# Patient Record
Sex: Male | Born: 1935 | Race: White | Hispanic: No | Marital: Married | Smoking: Never smoker
Health system: Southern US, Community
[De-identification: ages and names within clinical notes are randomized; demographics above are authoritative.]

## PROBLEM LIST (undated history)

## (undated) DIAGNOSIS — E785 Hyperlipidemia, unspecified: Secondary | ICD-10-CM

## (undated) DIAGNOSIS — C4490 Unspecified malignant neoplasm of skin, unspecified: Secondary | ICD-10-CM

## (undated) DIAGNOSIS — I639 Cerebral infarction, unspecified: Secondary | ICD-10-CM

## (undated) DIAGNOSIS — K219 Gastro-esophageal reflux disease without esophagitis: Secondary | ICD-10-CM

## (undated) DIAGNOSIS — I10 Essential (primary) hypertension: Secondary | ICD-10-CM

## (undated) DIAGNOSIS — Z8719 Personal history of other diseases of the digestive system: Secondary | ICD-10-CM

## (undated) HISTORY — PX: CHOLECYSTECTOMY: SHX55

## (undated) HISTORY — PX: OTHER SURGICAL HISTORY: SHX169

## (undated) HISTORY — PX: ANKLE SURGERY: SHX546

## (undated) HISTORY — PX: COLONOSCOPY: SHX174

## (undated) HISTORY — PX: TRANSURETHRAL RESECTION OF PROSTATE: SHX73

---

## 2004-12-31 ENCOUNTER — Ambulatory Visit: Payer: Self-pay | Admitting: Gastroenterology

## 2005-04-10 ENCOUNTER — Other Ambulatory Visit: Payer: Self-pay

## 2005-04-10 ENCOUNTER — Emergency Department: Payer: Self-pay | Admitting: Emergency Medicine

## 2006-09-13 ENCOUNTER — Ambulatory Visit: Payer: Self-pay | Admitting: Family Medicine

## 2006-09-14 ENCOUNTER — Inpatient Hospital Stay: Payer: Self-pay | Admitting: Vascular Surgery

## 2006-09-14 ENCOUNTER — Ambulatory Visit: Payer: Self-pay | Admitting: Internal Medicine

## 2008-05-15 ENCOUNTER — Ambulatory Visit: Payer: Self-pay | Admitting: Unknown Physician Specialty

## 2012-06-25 ENCOUNTER — Emergency Department: Payer: Self-pay | Admitting: Emergency Medicine

## 2013-06-21 ENCOUNTER — Emergency Department: Payer: Self-pay | Admitting: Emergency Medicine

## 2013-06-22 LAB — CBC
HGB: 16 g/dL (ref 13.0–18.0)
MCH: 33.4 pg (ref 26.0–34.0)
MCV: 96 fL (ref 80–100)
RBC: 4.79 10*6/uL (ref 4.40–5.90)
RDW: 14 % (ref 11.5–14.5)

## 2013-06-22 LAB — BASIC METABOLIC PANEL
Anion Gap: 5 — ABNORMAL LOW (ref 7–16)
BUN: 26 mg/dL — ABNORMAL HIGH (ref 7–18)
Calcium, Total: 9 mg/dL (ref 8.5–10.1)
Chloride: 106 mmol/L (ref 98–107)
Co2: 27 mmol/L (ref 21–32)
Creatinine: 1.18 mg/dL (ref 0.60–1.30)
EGFR (African American): >60
EGFR (Non-African Amer.): 58 — ABNORMAL LOW
Glucose: 128 mg/dL — ABNORMAL HIGH (ref 65–99)
Potassium: 4.6 mmol/L (ref 3.5–5.1)

## 2013-06-22 LAB — TROPONIN I: Troponin-I: <0.02 ng/mL

## 2013-08-01 ENCOUNTER — Ambulatory Visit: Payer: Self-pay | Admitting: Internal Medicine

## 2014-02-20 ENCOUNTER — Ambulatory Visit: Payer: Self-pay | Admitting: Internal Medicine

## 2015-03-20 ENCOUNTER — Other Ambulatory Visit: Payer: Self-pay | Admitting: Internal Medicine

## 2015-03-20 DIAGNOSIS — R911 Solitary pulmonary nodule: Secondary | ICD-10-CM

## 2015-03-26 ENCOUNTER — Ambulatory Visit
Admission: RE | Admit: 2015-03-26 | Discharge: 2015-03-26 | Disposition: A | Discharge status: Home / Self Care | Payer: Medicare Other | Source: Ambulatory Visit | Attending: Internal Medicine | Admitting: Internal Medicine

## 2015-03-26 DIAGNOSIS — R911 Solitary pulmonary nodule: Secondary | ICD-10-CM | POA: Insufficient documentation

## 2015-03-26 DIAGNOSIS — I2510 Atherosclerotic heart disease of native coronary artery without angina pectoris: Secondary | ICD-10-CM | POA: Diagnosis not present

## 2015-03-26 HISTORY — DX: Essential (primary) hypertension: I10

## 2015-03-26 MED ORDER — IOHEXOL 300 MG/ML  SOLN
75.0000 mL | Freq: Once | INTRAMUSCULAR | Status: AC | PRN
  Administered 2015-03-26: 75 mL via INTRAVENOUS

## 2015-09-30 ENCOUNTER — Emergency Department: Payer: Medicare Other

## 2015-09-30 DIAGNOSIS — R079 Chest pain, unspecified: Secondary | ICD-10-CM | POA: Diagnosis not present

## 2015-09-30 DIAGNOSIS — R0989 Other specified symptoms and signs involving the circulatory and respiratory systems: Secondary | ICD-10-CM | POA: Diagnosis present

## 2015-09-30 DIAGNOSIS — I10 Essential (primary) hypertension: Secondary | ICD-10-CM | POA: Diagnosis not present

## 2015-09-30 LAB — CBC
HEMATOCRIT: 43.8 % (ref 40.0–52.0)
Hemoglobin: 14.9 g/dL (ref 13.0–18.0)
MCH: 33 pg (ref 26.0–34.0)
MCHC: 33.9 g/dL (ref 32.0–36.0)
MCV: 97.1 fL (ref 80.0–100.0)
PLATELETS: 150 10*3/uL (ref 150–440)
RBC: 4.51 MIL/uL (ref 4.40–5.90)
RDW: 14.7 % — AB (ref 11.5–14.5)
WBC: 8.3 10*3/uL (ref 3.8–10.6)

## 2015-09-30 NOTE — ED Notes (Addendum)
Pt in with co feeling like food is stuck started after eating chili.  Has happened in the past but is able to pass on its own.  Now feels like he has reflux and feels discomfort in his chest.

## 2015-10-01 ENCOUNTER — Emergency Department
Admission: EM | Admit: 2015-10-01 | Discharge: 2015-10-01 | Disposition: A | Discharge status: Home / Self Care | Payer: Medicare Other | Attending: Emergency Medicine | Admitting: Emergency Medicine

## 2015-10-01 DIAGNOSIS — R0989 Other specified symptoms and signs involving the circulatory and respiratory systems: Secondary | ICD-10-CM

## 2015-10-01 DIAGNOSIS — R09A2 Foreign body sensation, throat: Secondary | ICD-10-CM

## 2015-10-01 DIAGNOSIS — R079 Chest pain, unspecified: Secondary | ICD-10-CM

## 2015-10-01 LAB — BASIC METABOLIC PANEL
ANION GAP: 8 (ref 5–15)
BUN: 36 mg/dL — ABNORMAL HIGH (ref 6–20)
CALCIUM: 9.4 mg/dL (ref 8.9–10.3)
CO2: 22 mmol/L (ref 22–32)
CREATININE: 1.58 mg/dL — AB (ref 0.61–1.24)
Chloride: 106 mmol/L (ref 101–111)
GFR, EST AFRICAN AMERICAN: 45 mL/min — AB (ref >60)
GFR, EST NON AFRICAN AMERICAN: 39 mL/min — AB (ref >60)
Glucose, Bld: 114 mg/dL — ABNORMAL HIGH (ref 65–99)
Potassium: 4.2 mmol/L (ref 3.5–5.1)
SODIUM: 136 mmol/L (ref 135–145)

## 2015-10-01 LAB — TROPONIN I

## 2015-10-01 NOTE — ED Provider Notes (Signed)
[facility] Emergency Department Provider Note  ____________________________________________  Time seen: Approximately 3:24 AM  I have reviewed the triage vital signs and the nursing notes.   HISTORY  Chief Complaint Foreign Body    HPI Ronald Haney is a 82 y.o. male who presents to the ED from home with a chief complaint of feeling like food is stuck in his throat. History of esophageal stenosis s/p dilation several years ago. Patient ate beef chili this evening and feels that food is stuck in his lower esophagus. He tried to drink water but spit it back up. States he usually is able to resolve the feeling at home but is unable to this evening. Also feels like he has reflux and his chest. Denies associated symptoms of diaphoresis, shortness of breath, palpitations, dizziness, nausea, vomiting. Denies recent fever, chills, cough, congestion. Denies recent travel or trauma. Nothing makes his symptoms better or worse.   Past Medical History  Diagnosis Date  . Hypertension   Esophageal stenosis  There are no active problems to display for this patient.   Past surgical history Esophageal dilation  No current outpatient prescriptions on file.  Allergies Review of patient's allergies indicates no known allergies.  No family history on file.  Social History Social History  Substance Use Topics  . Smoking status: Not on file  . Smokeless tobacco: Not on file  . Alcohol Use: Not on file  Nonsmoker  Review of Systems  Constitutional: No fever/chills. Eyes: No visual changes. ENT: No sore throat. Cardiovascular: Positive for foreign body sensation and chest pain. Respiratory: Denies shortness of breath. Gastrointestinal: No abdominal pain.  No nausea, no vomiting.  No diarrhea.  No constipation. Genitourinary: Negative for dysuria. Musculoskeletal: Negative for back pain. Skin: Negative for rash. Neurological: Negative for headaches, focal  weakness or numbness.  10-point ROS otherwise negative.  ____________________________________________   PHYSICAL EXAM:  VITAL SIGNS: ED Triage Vitals  Enc Vitals Group     BP 09/30/15 2321 163/77 mmHg     Pulse Rate 09/30/15 2321 58     Resp 10/01/15 0300 20     Temp 09/30/15 2321 98.1 F (36.7 C)     Temp Source 09/30/15 2321 Oral     SpO2 09/30/15 2321 98 %     Weight 09/30/15 2321 189 lb (85.73 kg)     Height 09/30/15 2321 5\' 10"  (1.778 m)     Head Cir --      Peak Flow --      Pain Score 09/30/15 2325 5     Pain Loc --      Pain Edu? --      Excl. in [place]? --     Constitutional: Alert and oriented. Well appearing and in no acute distress. Eyes: Conjunctivae are normal. PERRL. EOMI. Head: Atraumatic. Nose: No congestion/rhinnorhea. Mouth/Throat: Mucous membranes are moist.  Oropharynx non-erythematous. Neck: No stridor.   Cardiovascular: Normal rate, regular rhythm. Grossly normal heart sounds.  Good peripheral circulation. Respiratory: Normal respiratory effort.  No retractions. Lungs CTAB. Gastrointestinal: Soft and nontender. No distention. No abdominal bruits. No CVA tenderness. Musculoskeletal: No lower extremity tenderness nor edema.  No joint effusions. Neurologic:  Normal speech and language. No gross focal neurologic deficits are appreciated. No gait instability. Skin:  Skin is warm, dry and intact. No rash noted. Psychiatric: Mood and affect are normal. Speech and behavior are normal.  ____________________________________________   LABS (all labs ordered are listed, but only abnormal results are displayed)  Labs Reviewed  CBC - Abnormal; Notable for the following:    RDW 14.7 (*)    All other components within normal limits  BASIC METABOLIC PANEL - Abnormal; Notable for the following:    Glucose, Bld 114 (*)    BUN 36 (*)    Creatinine, Ser 1.58 (*)    GFR calc non Af Amer 39 (*)    GFR calc Af Amer 45 (*)    All other components within normal  limits  TROPONIN I   ____________________________________________  EKG  ED ECG REPORT I, Mckenzee Beem J, the attending physician, personally viewed and interpreted this ECG.   Date: 10/01/2015  EKG Time: 2335  Rate: 54  Rhythm: normal EKG, normal sinus rhythm  Axis: Normal  Intervals:first-degree A-V block   ST&T Change: Nonspecific  ____________________________________________  RADIOLOGY  Chest x-ray (viewed by me, interpreted per Dr. Alroy Dust): No active cardiopulmonary disease ____________________________________________   PROCEDURES  Procedure(s) performed: None  Critical Care performed: No  ____________________________________________   INITIAL IMPRESSION / ASSESSMENT AND PLAN / ED COURSE  Pertinent labs & imaging results that were available during my care of the patient were reviewed by me and considered in my medical decision making (see chart for details).  82 year old male who presents with foreign body sensation in his lower esophagus after eating beef chili. Patient was given soda to drink while in ED lobby which has completely resolved his symptoms. EKG and troponin unremarkable. Advised him to orally hydrate given his BUN and creatinine. Will refer to GI for outpatient follow-up. Low suspicion for cardiac pathology such as ACS, PE, aortic dissection. Strict return precautions given. Patient and spouse verbalize understanding and agree with plan of care. ____________________________________________   FINAL CLINICAL IMPRESSION(S) / ED DIAGNOSES  Final diagnoses:  Chest pain, unspecified chest pain type  Foreign body sensation in throat      Paulette Blanch, MD 10/01/15 [phone]

## 2015-10-01 NOTE — Discharge Instructions (Signed)
1. Avoid chunks of bread or meat. Cut your food into very small pieces and chew thoroughly before swallowing. 2. Return to the ER for worsening symptoms, persistent vomiting, difficult breathing or other concerns.  Nonspecific Chest Pain  Chest pain can be caused by many different conditions. There is always a chance that your pain could be related to something serious, such as a heart attack or a blood clot in your lungs. Chest pain can also be caused by conditions that are not life-threatening. If you have chest pain, it is very important to follow up with your health care provider. CAUSES  Chest pain can be caused by:  Heartburn.  Pneumonia or bronchitis.  Anxiety or stress.  Inflammation around your heart (pericarditis) or lung (pleuritis or pleurisy).  A blood clot in your lung.  A collapsed lung (pneumothorax). It can develop suddenly on its own (spontaneous pneumothorax) or from trauma to the chest.  Shingles infection (varicella-zoster virus).  Heart attack.  Damage to the bones, muscles, and cartilage that make up your chest wall. This can include:  Bruised bones due to injury.  Strained muscles or cartilage due to frequent or repeated coughing or overwork.  Fracture to one or more ribs.  Sore cartilage due to inflammation (costochondritis). RISK FACTORS  Risk factors for chest pain may include:  Activities that increase your risk for trauma or injury to your chest.  Respiratory infections or conditions that cause frequent coughing.  Medical conditions or overeating that can cause heartburn.  Heart disease or family history of heart disease.  Conditions or health behaviors that increase your risk of developing a blood clot.  Having had chicken pox (varicella zoster). SIGNS AND SYMPTOMS Chest pain can feel like:  Burning or tingling on the surface of your chest or deep in your chest.  Crushing, pressure, aching, or squeezing pain.  Dull or sharp pain that  is worse when you move, cough, or take a deep breath.  Pain that is also felt in your back, neck, shoulder, or arm, or pain that spreads to any of these areas. Your chest pain may come and go, or it may stay constant. DIAGNOSIS Lab tests or other studies may be needed to find the cause of your pain. Your health care provider may have you take a test called an ambulatory ECG (electrocardiogram). An ECG records your heartbeat patterns at the time the test is performed. You may also have other tests, such as:  Transthoracic echocardiogram (TTE). During echocardiography, sound waves are used to create a picture of all of the heart structures and to look at how blood flows through your heart.  Transesophageal echocardiogram (TEE).This is a more advanced imaging test that obtains images from inside your body. It allows your health care provider to see your heart in finer detail.  Cardiac monitoring. This allows your health care provider to monitor your heart rate and rhythm in real time.  Holter monitor. This is a portable device that records your heartbeat and can help to diagnose abnormal heartbeats. It allows your health care provider to track your heart activity for several days, if needed.  Stress tests. These can be done through exercise or by taking medicine that makes your heart beat more quickly.  Blood tests.  Imaging tests. TREATMENT  Your treatment depends on what is causing your chest pain. Treatment may include:  Medicines. These may include:  Acid blockers for heartburn.  Anti-inflammatory medicine.  Pain medicine for inflammatory conditions.  Antibiotic medicine, if  an infection is present.  Medicines to dissolve blood clots.  Medicines to treat coronary artery disease.  Supportive care for conditions that do not require medicines. This may include:  Resting.  Applying heat or cold packs to injured areas.  Limiting activities until pain decreases. HOME CARE  INSTRUCTIONS  If you were prescribed an antibiotic medicine, finish it all even if you start to feel better.  Avoid any activities that bring on chest pain.  Do not use any tobacco products, including cigarettes, chewing tobacco, or electronic cigarettes. If you need help quitting, ask your health care provider.  Do not drink alcohol.  Take medicines only as directed by your health care provider.  Keep all follow-up visits as directed by your health care provider. This is important. This includes any further testing if your chest pain does not go away.  If heartburn is the cause for your chest pain, you may be told to keep your head raised (elevated) while sleeping. This reduces the chance that acid will go from your stomach into your esophagus.  Make lifestyle changes as directed by your health care provider. These may include:  Getting regular exercise. Ask your health care provider to suggest some activities that are safe for you.  Eating a heart-healthy diet. A registered dietitian can help you to learn healthy eating options.  Maintaining a healthy weight.  Managing diabetes, if necessary.  Reducing stress. SEEK MEDICAL CARE IF:  Your chest pain does not go away after treatment.  You have a rash with blisters on your chest.  You have a fever. SEEK IMMEDIATE MEDICAL CARE IF:   Your chest pain is worse.  You have an increasing cough, or you cough up blood.  You have severe abdominal pain.  You have severe weakness.  You faint.  You have chills.  You have sudden, unexplained chest discomfort.  You have sudden, unexplained discomfort in your arms, back, neck, or jaw.  You have shortness of breath at any time.  You suddenly start to sweat, or your skin gets clammy.  You feel nauseous or you vomit.  You suddenly feel light-headed or dizzy.  Your heart begins to beat quickly, or it feels like it is skipping beats. These symptoms may represent a serious  problem that is an emergency. Do not wait to see if the symptoms will go away. Get medical help right away. Call your local emergency services (911 in the [place] Do not drive yourself to the hospital.   This information is not intended to replace advice given to you by your health care provider. Make sure you discuss any questions you have with your health care provider.   Document Released: 03/23/2005 Document Revised: 07/04/2014 Document Reviewed: 01/17/2014 Elsevier Interactive Patient Education [employer].

## 2015-10-01 NOTE — ED Notes (Signed)
Pt uprite on stretcher in exam room with no distress noted; pt reports feeling like food was stuck  after eating chili. Has happened in the past but is able to pass on its own. Now feels like he has reflux and feels discomfort in his chest; pt st complete relief of discomfort after drinking a soda in triage; pt denies any c/o at present;resp even/unlab, lungs clear, apical audible & regular; +BS, abd soft/nondist/nontender

## 2017-06-27 DIAGNOSIS — I639 Cerebral infarction, unspecified: Secondary | ICD-10-CM

## 2017-06-27 HISTORY — DX: Cerebral infarction, unspecified: I63.9

## 2017-12-23 ENCOUNTER — Emergency Department: Payer: Medicare Other

## 2017-12-23 ENCOUNTER — Other Ambulatory Visit: Payer: Self-pay

## 2017-12-23 ENCOUNTER — Observation Stay
Admission: EM | Admit: 2017-12-23 | Discharge: 2017-12-24 | Disposition: A | Discharge status: Home / Self Care | Payer: Medicare Other | Attending: Internal Medicine | Admitting: Internal Medicine

## 2017-12-23 ENCOUNTER — Observation Stay: Payer: Medicare Other

## 2017-12-23 ENCOUNTER — Encounter: Payer: Self-pay | Admitting: Emergency Medicine

## 2017-12-23 ENCOUNTER — Observation Stay
Admit: 2017-12-23 | Discharge: 2017-12-23 | Disposition: A | Discharge status: Home / Self Care | Payer: Medicare Other | Attending: Internal Medicine | Admitting: Internal Medicine

## 2017-12-23 DIAGNOSIS — I639 Cerebral infarction, unspecified: Secondary | ICD-10-CM | POA: Diagnosis present

## 2017-12-23 DIAGNOSIS — Z79899 Other long term (current) drug therapy: Secondary | ICD-10-CM | POA: Insufficient documentation

## 2017-12-23 DIAGNOSIS — I10 Essential (primary) hypertension: Secondary | ICD-10-CM | POA: Insufficient documentation

## 2017-12-23 DIAGNOSIS — R29701 NIHSS score 1: Secondary | ICD-10-CM | POA: Insufficient documentation

## 2017-12-23 DIAGNOSIS — Z85828 Personal history of other malignant neoplasm of skin: Secondary | ICD-10-CM | POA: Insufficient documentation

## 2017-12-23 DIAGNOSIS — R4781 Slurred speech: Secondary | ICD-10-CM | POA: Insufficient documentation

## 2017-12-23 DIAGNOSIS — K219 Gastro-esophageal reflux disease without esophagitis: Secondary | ICD-10-CM | POA: Diagnosis not present

## 2017-12-23 DIAGNOSIS — Z7982 Long term (current) use of aspirin: Secondary | ICD-10-CM | POA: Insufficient documentation

## 2017-12-23 DIAGNOSIS — E785 Hyperlipidemia, unspecified: Secondary | ICD-10-CM | POA: Diagnosis not present

## 2017-12-23 HISTORY — DX: Hyperlipidemia, unspecified: E78.5

## 2017-12-23 HISTORY — DX: Gastro-esophageal reflux disease without esophagitis: K21.9

## 2017-12-23 HISTORY — DX: Unspecified malignant neoplasm of skin, unspecified: C44.90

## 2017-12-23 LAB — COMPREHENSIVE METABOLIC PANEL
ALBUMIN: 4.2 g/dL (ref 3.5–5.0)
ALK PHOS: 54 U/L (ref 38–126)
ALT: 18 U/L (ref 0–44)
ANION GAP: 10 (ref 5–15)
AST: 23 U/L (ref 15–41)
BILIRUBIN TOTAL: 1.1 mg/dL (ref 0.3–1.2)
BUN: 34 mg/dL — ABNORMAL HIGH (ref 8–23)
CALCIUM: 9.4 mg/dL (ref 8.9–10.3)
CO2: 23 mmol/L (ref 22–32)
Chloride: 102 mmol/L (ref 98–111)
Creatinine, Ser: 1.12 mg/dL (ref 0.61–1.24)
GFR calc Af Amer: >60 mL/min (ref >60)
GFR, EST NON AFRICAN AMERICAN: 58 mL/min — AB (ref >60)
GLUCOSE: 105 mg/dL — AB (ref 70–99)
Potassium: 4 mmol/L (ref 3.5–5.1)
Sodium: 135 mmol/L (ref 135–145)
TOTAL PROTEIN: 7.7 g/dL (ref 6.5–8.1)

## 2017-12-23 LAB — DIFFERENTIAL
Basophils Absolute: 0 10*3/uL (ref 0–0.1)
Basophils Relative: 0 %
EOS ABS: 0.1 10*3/uL (ref 0–0.7)
EOS PCT: 1 %
LYMPHS ABS: 3.3 10*3/uL (ref 1.0–3.6)
LYMPHS PCT: 44 %
MONOS PCT: 8 %
Monocytes Absolute: 0.6 10*3/uL (ref 0.2–1.0)
NEUTROS PCT: 47 %
Neutro Abs: 3.6 10*3/uL (ref 1.4–6.5)

## 2017-12-23 LAB — PROTIME-INR
INR: 1.02
Prothrombin Time: 13.3 seconds (ref 11.4–15.2)

## 2017-12-23 LAB — CBC
HEMATOCRIT: 47.1 % (ref 40.0–52.0)
HEMOGLOBIN: 16.2 g/dL (ref 13.0–18.0)
MCH: 33.4 pg (ref 26.0–34.0)
MCHC: 34.5 g/dL (ref 32.0–36.0)
MCV: 96.9 fL (ref 80.0–100.0)
Platelets: 170 10*3/uL (ref 150–440)
RBC: 4.86 MIL/uL (ref 4.40–5.90)
RDW: 13.4 % (ref 11.5–14.5)
WBC: 7.6 10*3/uL (ref 3.8–10.6)

## 2017-12-23 LAB — GLUCOSE, CAPILLARY: Glucose-Capillary: 101 mg/dL — ABNORMAL HIGH (ref 70–99)

## 2017-12-23 LAB — TROPONIN I

## 2017-12-23 LAB — APTT: aPTT: 29 seconds (ref 24–36)

## 2017-12-23 MED ORDER — ASPIRIN 81 MG PO CHEW
324.0000 mg | CHEWABLE_TABLET | Freq: Once | ORAL | Status: AC
  Administered 2017-12-23: 324 mg via ORAL
  Filled 2017-12-23: qty 4

## 2017-12-23 MED ORDER — LISINOPRIL 20 MG PO TABS
30.0000 mg | ORAL_TABLET | Freq: Every day | ORAL | Status: DC
  Filled 2017-12-23: qty 1

## 2017-12-23 MED ORDER — GEMFIBROZIL 600 MG PO TABS
600.0000 mg | ORAL_TABLET | Freq: Two times a day (BID) | ORAL | Status: DC
  Administered 2017-12-23 – 2017-12-24 (×2): 600 mg via ORAL
  Filled 2017-12-23 (×4): qty 1

## 2017-12-23 MED ORDER — ACETAMINOPHEN 160 MG/5ML PO SOLN
650.0000 mg | ORAL | Status: DC | PRN
  Filled 2017-12-23: qty 20.3

## 2017-12-23 MED ORDER — ASPIRIN 325 MG PO TABS
325.0000 mg | ORAL_TABLET | Freq: Every day | ORAL | Status: DC
  Administered 2017-12-24: 10:00:00 325 mg via ORAL
  Filled 2017-12-23: qty 1

## 2017-12-23 MED ORDER — PANTOPRAZOLE SODIUM 40 MG PO TBEC
40.0000 mg | DELAYED_RELEASE_TABLET | Freq: Every day | ORAL | Status: DC
  Administered 2017-12-24: 40 mg via ORAL
  Filled 2017-12-23: qty 1

## 2017-12-23 MED ORDER — HYDROCHLOROTHIAZIDE 25 MG PO TABS
12.5000 mg | ORAL_TABLET | Freq: Every day | ORAL | Status: DC
  Administered 2017-12-24: 12.5 mg via ORAL
  Filled 2017-12-23: qty 1

## 2017-12-23 MED ORDER — ASPIRIN 81 MG PO CHEW: 81.0000 mg | CHEWABLE_TABLET | Freq: Every day | ORAL | Status: DC

## 2017-12-23 MED ORDER — FLUTICASONE PROPIONATE 50 MCG/ACT NA SUSP
2.0000 | Freq: Every day | NASAL | Status: DC
  Filled 2017-12-23 (×2): qty 16

## 2017-12-23 MED ORDER — ACETAMINOPHEN 325 MG PO TABS: 650.0000 mg | ORAL_TABLET | ORAL | Status: DC | PRN

## 2017-12-23 MED ORDER — ENOXAPARIN SODIUM 40 MG/0.4ML ~~LOC~~ SOLN
40.0000 mg | SUBCUTANEOUS | Status: DC
  Administered 2017-12-23: 22:00:00 40 mg via SUBCUTANEOUS
  Filled 2017-12-23: qty 0.4

## 2017-12-23 MED ORDER — ATENOLOL 25 MG PO TABS
25.0000 mg | ORAL_TABLET | Freq: Every day | ORAL | Status: DC
  Filled 2017-12-23 (×2): qty 1

## 2017-12-23 MED ORDER — STROKE: EARLY STAGES OF RECOVERY BOOK
Freq: Once | Status: AC
  Administered 2017-12-23: 14:00:00

## 2017-12-23 MED ORDER — ASPIRIN 300 MG RE SUPP: 300.0000 mg | Freq: Every day | RECTAL | Status: DC

## 2017-12-23 MED ORDER — ACETAMINOPHEN 650 MG RE SUPP: 650.0000 mg | RECTAL | Status: DC | PRN

## 2017-12-23 NOTE — ED Triage Notes (Signed)
Slurred speech began 8am today. Smile symmetrical, grips and leg strength equal

## 2017-12-23 NOTE — Consult Note (Signed)
[facility] TeleNeurology Consult Services  Asked to see this patient in telemedicine consultation. Consultation was performed with assistance of ancillary/medical staff at bedside.  Comments: Last Known normal  [address] Time: 823 [facility] Contacted: 151 [facility] first log in: 849 NIHSS assessment time: 906      HPI:  89 yom with acute onset of slurred speech this am with difficulty finding his words. He was seen by family with sxs and they were present when it developed. He presents to ED as a stroke alert and underwent ct scan head. He has improved with his sxs since returning from CT scan and only has slight stutter but speaks fluently without slurring his words. CT scan head was negative per radiology report. Per family patient is not on any a/c at this time.  VSS  Gen Wn/Wd in Nad  TeleStroke Assessment: LOC:   0 LOC questions:  0 LOC Commands :   0 Gaze : 0 Visual fields :  0  Facial movements : 0 Upper limb Motor  0 Lower limb Motor  0 Limb Coordination  - 0 Sensory -  - 0 Language -  0-1 Speech -   0 Neglect / extinction -  0  NIHSS Score: 0-1    IMPRESSION  TIA RO Stroke Hypertension   Differential Diagnosis: 1- Cardioembolic stroke 2- Small vessel disease 3- Thrombotic artery to artery mechanism 4- Hypercoagulable state related 5- Thrombotic large vessel disease  Blood Pressure and Blood Glucose within acceptable parameters.   Medical Decision Making:   Patient is not candidate for alteplase due to non focal / localizing exam and rapidly improving sxs.  Not an IR candidate as low clinical suspicion for LVO by neurologic assessment.   Recommendations: - Daily antithrombotics to initiate now for stroke prevention if no contraindication.  - Further work up with Stroke labs, MRI brain, ECHO, NIVS Carotid can be done as  -  Inpatient Neurology to follow up  - Thank you for allowing [place] to participate in the care of your  patient, if there are any questions please don't hesitate to contact us  Discussed plan of care with patient/family/hospital staff   Physician: Sylvan Cheese, DO   [facility]

## 2017-12-23 NOTE — Progress Notes (Signed)
CODE STROKE- PHARMACY COMMUNICATION   Time CODE STROKE called/page received:0827  Time response to CODE STROKE was made (in person or via phone): 0908  Time Stroke Kit retrieved from [place] (only if needed):  Name of Provider/Nurse contacted: lorrie  Past Medical History:  Diagnosis Date  . Hypertension    Prior to Admission medications   Medication Sig Start Date End Date Taking? Authorizing Provider  aspirin 81 MG chewable tablet Chew 1 tablet by mouth daily.   Yes [provider]  atenolol (TENORMIN) 25 MG tablet Take 1 tablet by mouth daily. 06/15/17 06/15/18 Yes [provider]  fluticasone (FLONASE) 50 MCG/ACT nasal spray Place 2 sprays into both nostrils daily. 10/03/17  Yes [provider]  gemfibrozil (LOPID) 600 MG tablet Take 1 tablet by mouth 2 (two) times daily. 09/23/17  Yes [provider]  hydrochlorothiazide (HYDRODIURIL) 25 MG tablet Take 12.5 mg by mouth daily. 09/23/17  Yes [provider]  lisinopril (PRINIVIL,ZESTRIL) 30 MG tablet Take 30 mg by mouth daily. 11/20/17  Yes [provider]  omeprazole (PRILOSEC) 20 MG capsule Take 20 mg by mouth daily. 11/20/17  Yes [provider]    Ramond Dial ,PharmD Clinical Pharmacist  12/23/2017  9:08 AM

## 2017-12-23 NOTE — Progress Notes (Signed)
OT Cancellation Note  Patient Details Name: Ronald Haney MRN: [id number] DOB: 1930/08/04   Cancelled Treatment:    Reason Eval/Treat Not Completed: OT screened, no needs identified, will sign off  Harrel Carina, MS, OTR/L 12/23/2017, 1:56 PM

## 2017-12-23 NOTE — Care Management Note (Signed)
Case Management Note  Patient Details  Name: Ronald Haney MRN: [id number] Date of Birth: 06/27/1931  Subjective/Objective:  Patient admitted to [facility] under observation status for CVA. RNCM consulted on patient to provide MOON letter and complete assessment. Patient is completely independent with his activities of daily living. Requires no DME. Lives with spouse Ronald Haney [phone]. PT provided the choice of outpatient PT/OT if patient desires, patient currently not interested. Patient and spouse still drive. PCP is Dr Ginette Pitman. Obtains medications through [place] without issue.                   Action/Plan:  RNCM to continue to follow for any needs.  Expected Discharge Date:                  Expected Discharge Plan:     In-House Referral:     Discharge planning Services     Post Acute Care Choice:    Choice offered to:     DME Arranged:    DME Agency:     HH Arranged:    HH Agency:     Status of Service:     If discussed at [employer], dates discussed:    Additional Comments:  Latanya Maudlin, RN 12/23/2017, 3:17 PM

## 2017-12-23 NOTE — ED Provider Notes (Signed)
[facility] Emergency Department Provider Note ___________________________________________   First MD Initiated Contact with Patient 12/23/17 [phone]     (approximate)  I have reviewed the triage vital signs and the nursing notes.   HISTORY  Chief Complaint Code Stroke  HPI Ronald Haney is a 84 y.o. male with a history of hypertension, just taken off of aspirin 2 weeks ago, who is presenting to the emergency department with slurred speech as of 1 hour ago.  He denies any weakness or numbness.  No previous history of stroke.  No previous history of heart attack.  Patient says that he woke up normally this morning and then the symptoms began.  Wife does not report any facial asymmetry.  Past Medical History:  Diagnosis Date  . Hypertension     There are no active problems to display for this patient.   History reviewed. No pertinent surgical history.  Prior to Admission medications   Medication Sig Start Date End Date Taking? Authorizing Provider  aspirin 81 MG chewable tablet Chew 1 tablet by mouth daily.   Yes [provider]  atenolol (TENORMIN) 25 MG tablet Take 1 tablet by mouth daily. 06/15/17 06/15/18 Yes [provider]  fluticasone (FLONASE) 50 MCG/ACT nasal spray Place 2 sprays into both nostrils daily. 10/03/17  Yes [provider]  gemfibrozil (LOPID) 600 MG tablet Take 1 tablet by mouth 2 (two) times daily. 09/23/17  Yes [provider]  hydrochlorothiazide (HYDRODIURIL) 25 MG tablet Take 12.5 mg by mouth daily. 09/23/17  Yes [provider]  lisinopril (PRINIVIL,ZESTRIL) 30 MG tablet Take 30 mg by mouth daily. 11/20/17  Yes [provider]  omeprazole (PRILOSEC) 20 MG capsule Take 20 mg by mouth daily. 11/20/17  Yes [provider]    Allergies Patient has no known allergies.  No family history on file.  Social History Social History   Tobacco Use  . Smoking status: Never Smoker   Substance Use Topics  . Alcohol use: Not on file  . Drug use: Not on file    Review of Systems Constitutional: No fever/chills Eyes: No visual changes. ENT: No sore throat. Cardiovascular: Denies chest pain. Respiratory: Denies shortness of breath. Gastrointestinal: No abdominal pain.  No nausea, no vomiting.  No diarrhea.  No constipation. Genitourinary: Negative for dysuria. Musculoskeletal: Negative for back pain. Skin: Negative for rash. Neurological: Negative for headaches, focal weakness or numbness. ____________________________________________   PHYSICAL EXAM:  VITAL SIGNS: ED Triage Vitals  Enc Vitals Group     BP 12/23/17 0831 (!) 164/78     Pulse Rate 12/23/17 0831 (!) 59     Resp 12/23/17 0831 18     Temp 12/23/17 0831 98.7 F (37.1 C)     Temp Source 12/23/17 0831 Oral     SpO2 12/23/17 0831 94 %     Weight 12/23/17 0831 190 lb (86.2 kg)     Height 12/23/17 0831 5\' 10"  (1.778 m)     Head Circumference --      Peak Flow --      Pain Score 12/23/17 0830 0     Pain Loc --      Pain Edu? --      Excl. in [place]? --     Constitutional: Alert and oriented. Well appearing and in no acute distress. Eyes: Conjunctivae are normal.  Head: Atraumatic. Nose: No congestion/rhinnorhea. Mouth/Throat: Mucous membranes are moist.  Neck: No stridor.   Cardiovascular: Normal rate, regular rhythm. Grossly  normal heart sounds.   Respiratory: Normal respiratory effort.  No retractions. Lungs CTAB. Gastrointestinal: Soft and nontender. No distention. No CVA tenderness. Musculoskeletal: No lower extremity tenderness nor edema.  No joint effusions. Neurologic: Slurred speech which is slowed.  No appreciable numbness.  Flattening of the nasolabial folds on the right side but the patient's wife says that this is his baseline when he smiles. Skin:  Skin is warm, dry and intact. No rash noted. Psychiatric: Mood and affect are normal. Speech and behavior are normal.  NIH Stroke  Scale   Interval: initial assessment Time: 9:14 AM Person Administering Scale: Doran Stabler  Administer stroke scale items in the order listed. Record performance in each category after each subscale exam. Do not go back and change scores. Follow directions provided for each exam technique. Scores should reflect what the patient does, not what the clinician thinks the patient can do. The clinician should record answers while administering the exam and work quickly. Except where indicated, the patient should not be coached (i.e., repeated requests to patient to make a special effort).   1a  Level of consciousness: 0=alert; keenly responsive  1b. LOC questions:  0=Performs both tasks correctly  1c. LOC commands: 0=Performs both tasks correctly  2.  Best Gaze: 0=normal  3.  Visual: 0=No visual loss  4. Facial Palsy: 1= mild right sided facial drooping  5a.  Motor left arm: 0=No drift, limb holds 90 (or 45) degrees for full 10 seconds  5b.  Motor right arm: 0=No drift, limb holds 90 (or 45) degrees for full 10 seconds  6a. motor left leg: 0=No drift, limb holds 90 (or 45) degrees for full 10 seconds  6b  Motor right leg:  0=No drift, limb holds 90 (or 45) degrees for full 10 seconds  7. Limb Ataxia: 0=Absent  8.  Sensory: 0=Normal; no sensory loss  9. Best Language:  0=No aphasia, normal  10. Dysarthria: 1=Mild to moderate, patient slurs at least some words and at worst, can be understood with some difficulty  11. Extinction and Inattention: 0=No abnormality  12. Distal motor function: 0=Normal   Total:   2   ____________________________________________   LABS (all labs ordered are listed, but only abnormal results are displayed)  Labs Reviewed  COMPREHENSIVE METABOLIC PANEL - Abnormal; Notable for the following components:      Result Value   Glucose, Bld 105 (*)    BUN 34 (*)    GFR calc non Af Amer 58 (*)    All other components within normal limits  GLUCOSE, CAPILLARY -  Abnormal; Notable for the following components:   Glucose-Capillary 101 (*)    All other components within normal limits  PROTIME-INR  APTT  CBC  DIFFERENTIAL  TROPONIN I  CBG MONITORING, ED   ____________________________________________  EKG  ED ECG REPORT I, Doran Stabler, the attending physician, personally viewed and interpreted this ECG.   Date: 12/23/2017  EKG Time: 0832  Rate: 65  Rhythm: normal sinus rhythm  Axis: Normal  Intervals:nonspecific intraventricular conduction delay  ST&T Change: No ST segment elevation or depression.  No abnormal T wave inversion.  ____________________________________________  RADIOLOGY  CT head without any acute process identified ____________________________________________   PROCEDURES  Procedure(s) performed:   Procedures  Critical Care performed:   ____________________________________________   INITIAL IMPRESSION / ASSESSMENT AND PLAN / ED COURSE  Pertinent labs & imaging results that were available during my care of the patient were reviewed by me and  considered in my medical decision making (see chart for details).  Differential diagnosis includes, but is not limited to, alcohol, illicit or prescription medications, or other toxic ingestion; intracranial pathology such as stroke or intracerebral hemorrhage; fever or infectious causes including sepsis; hypoxemia and/or hypercarbia; uremia; trauma; endocrine related disorders such as diabetes, hypoglycemia, and thyroid-related diseases; hypertensive encephalopathy; etc. As part of my medical decision making, I reviewed the following data within the electronic MEDICAL RECORD NUMBER Notes from prior ED visits  ----------------------------------------- 9:14 AM on 12/23/2017 -----------------------------------------  Patient evaluated by the specialist on-call neurologist, Dr. Stark Klein, who ranks the patient's NIH is 0-1 and does not recommend TPA.  He recommends aspirin and  admission to the hospital for further diagnostic work-up.  Signed out to the hospitalist.  Patient and family aware of likely diagnosis and treatment plan willing to comply. ____________________________________________   FINAL CLINICAL IMPRESSION(S) / ED DIAGNOSES  Final diagnoses:  Cerebrovascular accident (CVA), unspecified mechanism ([place])      NEW MEDICATIONS STARTED DURING THIS VISIT:  New Prescriptions   No medications on file     Note:  This document was prepared using Dragon voice recognition software and may include unintentional dictation errors.     Orbie Pyo, MD 12/23/17 [phone]

## 2017-12-23 NOTE — ED Notes (Signed)
Pt is conversational with family at bedside at this time. Pt is NAD, awaiting tele neuro

## 2017-12-23 NOTE — Progress Notes (Signed)
Physical Therapy Evaluation Patient Details Name: Ronald Haney MRN: [id number] DOB: 1931-03-24 Today's Date: 12/23/2017   History of Present Illness  Ronald Haney  is a 84 y.o. male with a known history of GERD, hyperlipidemia, hypertension, history of skin cancer presenting to the hospital with complaint being of slurred speech.  Patient was walking with his wife earlier this morning when he developed slurred speech.  Patient came to the emergency room.  CT scan of the head shows no acute stroke.  We are asked to admit the patient for further evaluation.  Due to his symptoms improving neurology recommended no TPA.  Patient states that he was on aspirin but taken off of it 2 weeks ago by his cardiologist.  He stated that he just told him that he did not need it.  Clinical Impression  Pt reports no deficits in strength or sensation with current episode. He reports that slurring is approximately 90% improved since onset. He is independent with bed mobility and transfers. Pt is able to complete a full lap around RN station without an assistive device. He is able to perform horizontal and vertical head turns with mild lateral gait deviation. Able to perform minor gait speed changes smoothly. No DOE or imbalance with ambulation. Negative Romberg, single leg balance 2-3 seconds. Unable to perform tandem stance secondary to balance. Strength and sensation is symmetrical bilaterally. Pronator drift, rapid alternating movements, finger opposition, and finger to nose WNL. EOM intact without any nystagmus. Facial muscles strong with the exception of some very mild drooping of the R corner of his mouth with smiles. Son and wife confirm. No PT needs identified at this time. Will complete order. Please enter new order if status or needs change.      Follow Up Recommendations Outpatient PT;Other (comment)(OP PT for higher level balance deficits if pt desires)    Equipment Recommendations  None recommended by PT     Recommendations for Other Services       Precautions / Restrictions Precautions Precautions: Fall Restrictions Weight Bearing Restrictions: No      Mobility  Bed Mobility Overal bed mobility: Independent             General bed mobility comments: Good speed/seqwuencing  Transfers Overall transfer level: Independent Equipment used: None             General transfer comment: Good speed/sequencing  Ambulation/Gait Ambulation/Gait assistance: Independent Gait Distance (Feet): 200 Feet Assistive device: None   Gait velocity: WFL for limited community mobility   General Gait Details: Pt is able to complete a full lap around RN station without an assistive device. He is able to perform horizontal and vertical head turns with mild lateral gait deviation. Able to perform minor gait speed changes smoothly. No DOE or imbalance with ambulation  Stairs            Wheelchair Mobility    Modified Rankin (Stroke Patients Only)       Balance Overall balance assessment: Needs assistance Sitting-balance support: No upper extremity supported Sitting balance-Leahy Scale: Good     Standing balance support: No upper extremity supported Standing balance-Leahy Scale: Fair Standing balance comment: Negative Romberg, single leg balance 2-3 seconds. Unable to perform tandem stance                             Pertinent Vitals/Pain Pain Assessment: No/denies pain    Home Living Family/patient expects to be discharged to::  Private residence Living Arrangements: Spouse/significant other Available Help at Discharge: Family Type of Home: House Home Access: Stairs to enter Entrance Stairs-Rails: None Entrance Stairs-Number of Steps: 2 Home Layout: One level Home Equipment: Cane - single point      Prior Function Level of Independence: Independent         Comments: Independent with all ADLs/IADLs but has good support if needed from wife. No assistive  device for ambulation. Full community ambulator and drives. No falls     Hand Dominance   Dominant Hand: Right    Extremity/Trunk Assessment   Upper Extremity Assessment Upper Extremity Assessment: Overall WFL for tasks assessed    Lower Extremity Assessment Lower Extremity Assessment: Overall WFL for tasks assessed       Communication   Communication: No difficulties  Cognition Arousal/Alertness: Awake/alert Behavior During Therapy: WFL for tasks assessed/performed Overall Cognitive Status: Within Functional Limits for tasks assessed                                        General Comments      Exercises     Assessment/Plan    PT Assessment Patent does not need any further PT services  PT Problem List         PT Treatment Interventions      PT Goals (Current goals can be found in the Care Plan section)  Acute Rehab PT Goals PT Goal Formulation: All assessment and education complete, DC therapy    Frequency     Barriers to discharge        Co-evaluation               AM-PAC PT "6 Clicks" Daily Activity  Outcome Measure Difficulty turning over in bed (including adjusting bedclothes, sheets and blankets)?: None Difficulty moving from lying on back to sitting on the side of the bed? : None Difficulty sitting down on and standing up from a chair with arms (e.g., wheelchair, bedside commode, etc,.)?: None Help needed moving to and from a bed to chair (including a wheelchair)?: None Help needed walking in hospital room?: None Help needed climbing 3-5 steps with a railing? : None 6 Click Score: 24    End of Session Equipment Utilized During Treatment: Gait belt Activity Tolerance: Patient tolerated treatment well Patient left: in bed;with call bell/phone within reach;with family/visitor present Nurse Communication: Mobility status PT Visit Diagnosis: Unsteadiness on feet (R26.81)    Time: 1300-1315 PT Time Calculation (min) (ACUTE  ONLY): 15 min   Charges:   PT Evaluation $PT Eval Low Complexity: 1 Low     PT G Codes:        Gudrun Axe D Takeyah Wieman PT, DPT, GCS   Gean Laursen 12/23/2017, 1:42 PM

## 2017-12-23 NOTE — Progress Notes (Signed)
Code Stroke due to slurred speech. Wife and son present. Family has a wedding this weekend. Ronald Haney (preferred pt name) spoke at rehearsal dinner last night. Chaplain offered listening presence and words of assurance to [place] and family. Juanda Crumble was tearful at points during visit and was open to chaplain support. He even remember chaplain name.

## 2017-12-23 NOTE — ED Notes (Signed)
First Nurse Note: Pt woke up fine this am per wife, began having slurred speech at 0800.

## 2017-12-23 NOTE — Care Management Obs Status (Signed)
[place] NOTIFICATION   Patient Details  Name: Ronald Haney MRN: [id number] Date of Birth: Feb 25, 1931   Medicare Observation Status Notification Given:  Yes    Sallee Hogrefe A Litha Lamartina, RN 12/23/2017, 3:16 PM

## 2017-12-23 NOTE — ED Notes (Signed)
Pt transported to CT at this time.

## 2017-12-23 NOTE — Progress Notes (Signed)
Patient admitted to unit. Oriented to room, call bell, and staff. Bed in lowest position. Fall safety plan reviewed. Full assessment to Epic. Skin assessment verified with [facility] RN. Telemetry box verification with tele clerk- Box#: [id number]. Will continue to monitor.  Patient passed swallow screen in ED. [place] for patient to have cardiac diet per Dr. Posey Pronto.

## 2017-12-23 NOTE — ED Notes (Signed)
ED secretary notified of code stroke- pt to go to room 16.

## 2017-12-23 NOTE — Progress Notes (Signed)
Advanced care plan.  Purpose of the Encounter: CODE STATUS  Parties in Attendance: Patient himself his wife and son  Patient's Decision Capacity: Intact  Subjective/Patient's story: Ronald Haney  is a 84 y.o. male with a known history of GERD, hyperlipidemia, hypertension, history of skin cancer presenting to the hospital with complaint being of slurred speech.  Patient was walking with his wife earlier this morning when he developed slurred speech.  Patient came to the emergency room     Objective/Medical story  I discussed with the patient regarding his wishes for resuscitation explained to him cardiac and pulmonary resuscitation.  He would like everything done he would like to be a full code  Goals of care determination:   Full code  CODE STATUS: Full code  Time spent discussing advanced care planning: 16 minutes

## 2017-12-23 NOTE — H&P (Signed)
[facility] NAME: Ronald Haney    MR#:  [id number]  DATE OF BIRTH:  1930/10/16  DATE OF ADMISSION:  12/23/2017  PRIMARY CARE PHYSICIAN: Tracie Harrier, MD   REQUESTING/REFERRING PHYSICIAN: Orbie Pyo, MD  CHIEF COMPLAINT:   Chief Complaint  Patient presents with  . Code Stroke    HISTORY OF PRESENT ILLNESS: Ronald Haney  is a 84 y.o. male with a known history of GERD, hyperlipidemia, hypertension, history of skin cancer presenting to the hospital with complaint being of slurred speech.  Patient was walking with his wife earlier this morning when he developed slurred speech.  Patient came to the emergency room.  CT scan of the head shows no acute stroke.  We are asked to admit the patient for further evaluation.  Due to his symptoms improving neurology recommended no TPA.  Patient states that he was on aspirin but taken off of it 2 weeks ago by his cardiologist.  He stated that he just told him that he did not need it. Patient denies any trouble with weakness in his extremities.      PAST MEDICAL HISTORY:   Past Medical History:  Diagnosis Date  . GERD (gastroesophageal reflux disease)   . Hyperlipemia   . Hypertension   . Skin cancer     PAST SURGICAL HISTORY:  Past Surgical History:  Procedure Laterality Date  . ANKLE SURGERY    . ccy    . TRANSURETHRAL RESECTION OF PROSTATE      SOCIAL HISTORY:  Social History   Tobacco Use  . Smoking status: Never Smoker  . Smokeless tobacco: Never Used  Substance Use Topics  . Alcohol use: Not Currently    FAMILY HISTORY:  Family History  Problem Relation Age of Onset  . CAD Father     DRUG ALLERGIES: No Known Allergies  REVIEW OF SYSTEMS:   CONSTITUTIONAL: No fever, fatigue or weakness.  EYES: No blurred or double vision.  EARS, NOSE, AND THROAT: No tinnitus or ear pain.  RESPIRATORY: No cough, shortness of breath, wheezing or hemoptysis.   CARDIOVASCULAR: No chest pain, orthopnea, edema.  GASTROINTESTINAL: No nausea, vomiting, diarrhea or abdominal pain.  GENITOURINARY: No dysuria, hematuria.  ENDOCRINE: No polyuria, nocturia,  HEMATOLOGY: No anemia, easy bruising or bleeding SKIN: No rash or lesion. MUSCULOSKELETAL: No joint pain or arthritis.   NEUROLOGIC: Slurred speech with expressive aphasia PSYCHIATRY: No anxiety or depression.   MEDICATIONS AT HOME:  Prior to Admission medications   Medication Sig Start Date End Date Taking? Authorizing Provider  aspirin 81 MG chewable tablet Chew 1 tablet by mouth daily.   Yes [provider]  atenolol (TENORMIN) 25 MG tablet Take 1 tablet by mouth daily. 06/15/17 06/15/18 Yes [provider]  fluticasone (FLONASE) 50 MCG/ACT nasal spray Place 2 sprays into both nostrils daily. 10/03/17  Yes [provider]  gemfibrozil (LOPID) 600 MG tablet Take 1 tablet by mouth 2 (two) times daily. 09/23/17  Yes [provider]  hydrochlorothiazide (HYDRODIURIL) 25 MG tablet Take 12.5 mg by mouth daily. 09/23/17  Yes [provider]  lisinopril (PRINIVIL,ZESTRIL) 30 MG tablet Take 30 mg by mouth daily. 11/20/17  Yes [provider]  omeprazole (PRILOSEC) 20 MG capsule Take 20 mg by mouth daily. 11/20/17  Yes [provider]      PHYSICAL EXAMINATION:   VITAL SIGNS: Blood pressure (!) 153/73, pulse (!) 51, temperature 98.7 F (37.1 C), resp. rate Marland Kitchen)  22, height 5\' 10"  (1.778 m), weight 81.9 kg (180 lb 8 oz), SpO2 97 %.  GENERAL:  84 y.o.-year-old patient lying in the bed with no acute distress.  EYES: Pupils equal, round, reactive to light and accommodation. No scleral icterus. Extraocular muscles intact.  HEENT: Head atraumatic, normocephalic. Oropharynx and nasopharynx clear.  NECK:  Supple, no jugular venous distention. No thyroid enlargement, no tenderness.  LUNGS: Normal breath sounds bilaterally, no wheezing, rales,rhonchi or  crepitation. No use of accessory muscles of respiration.  CARDIOVASCULAR: S1, S2 normal. No murmurs, rubs, or gallops.  ABDOMEN: Soft, nontender, nondistended. Bowel sounds present. No organomegaly or mass.  EXTREMITIES: No pedal edema, cyanosis, or clubbing.  NEUROLOGIC: Cranial nerves II through XII are intact. Muscle strength 5/5 in all extremities. Sensation intact. Gait not checked.  Patient does have some slurred speech PSYCHIATRIC: The patient is alert and oriented x 3.  SKIN: No obvious rash, lesion, or ulcer.   LABORATORY PANEL:   CBC Recent Labs  Lab 12/23/17 0830  WBC 7.6  HGB 16.2  HCT 47.1  PLT 170  MCV 96.9  MCH 33.4  MCHC 34.5  RDW 13.4  LYMPHSABS 3.3  MONOABS 0.6  EOSABS 0.1  BASOSABS 0.0   ------------------------------------------------------------------------------------------------------------------  Chemistries  Recent Labs  Lab 12/23/17 0830  NA 135  K 4.0  CL 102  CO2 23  GLUCOSE 105*  BUN 34*  CREATININE 1.12  CALCIUM 9.4  AST 23  ALT 18  ALKPHOS 54  BILITOT 1.1   ------------------------------------------------------------------------------------------------------------------ estimated creatinine clearance is 50.7 mL/min (by C-G formula based on SCr of 1.12 mg/dL). ------------------------------------------------------------------------------------------------------------------ No results for input(s): TSH, T4TOTAL, T3FREE, THYROIDAB in the last 72 hours.  Invalid input(s): FREET3   Coagulation profile Recent Labs  Lab 12/23/17 0830  INR 1.02   ------------------------------------------------------------------------------------------------------------------- No results for input(s): DDIMER in the last 72 hours. -------------------------------------------------------------------------------------------------------------------  Cardiac Enzymes Recent Labs  Lab 12/23/17 0830  TROPONINI <0.03    ------------------------------------------------------------------------------------------------------------------ Invalid input(s): POCBNP  ---------------------------------------------------------------------------------------------------------------  Urinalysis No results found for: COLORURINE, APPEARANCEUR, LABSPEC, PHURINE, GLUCOSEU, HGBUR, BILIRUBINUR, KETONESUR, PROTEINUR, UROBILINOGEN, NITRITE, LEUKOCYTESUR   RADIOLOGY: Ct Head Code Stroke Wo Contrast  Result Date: 12/23/2017 CLINICAL DATA:  Code stroke. Sudden onset of slurred speech this morning EXAM: CT HEAD WITHOUT CONTRAST TECHNIQUE: Contiguous axial images were obtained from the base of the skull through the vertex without intravenous contrast. COMPARISON:  None. FINDINGS: Brain: No evidence of acute infarction, hemorrhage, hydrocephalus, extra-axial collection or mass lesion/mass effect. Low-density in the right thalamus, history and discrete appearance compatible with chronicity. Otherwise age normal brain. Vascular: No hyperdense vessel or unexpected calcification. Skull: Negative Sinuses/Orbits: Negative Other: These results were called by telephone at the time of interpretation on 12/23/2017 at 8:50 am to Dr. Larae Grooms , who verbally acknowledged these results. ASPECTS [facility] Stroke Program Early CT Score) - Ganglionic level infarction (caudate, lentiform nuclei, internal capsule, insula, M1-M3 cortex): 7 - Supraganglionic infarction (M4-M6 cortex): 3 Total score (0-10 with 10 being normal): 10 IMPRESSION: 1. No acute finding.  ASPECTS is 10. 2. Remote lacunar infarct in the right thalamus. Electronically Signed   By: Monte Fantasia M.D.   On: 12/23/2017 08:51    EKG: Orders placed or performed during the hospital encounter of 12/23/17  . ED EKG  . ED EKG  . EKG 12-Lead  . EKG 12-Lead    IMPRESSION AND PLAN: Patient is 84 year old presenting to the hospital with complaint of slurred speech  1.  Slurred speech  differential  diagnosis includes stroke , TIA We will admit patient to neurology floor Obtain MRI MRI of the brain Start patient on aspirin Start patient on cholesterol-lowering medication Echocardiogram of the heart Neurology has already seen the patient PT evaluation   2.  Essential hypertension continue Tenormin and HCTZ and lisinopril  3.  Hyperlipidemia continue Lopid check a lipid panel in the a.m. add Lipitor to current regimen   4.  Seasonal allergies continue Flonase  5.  GERD continue omeprazole  6.  DVT prophylaxis  All the records are reviewed and case discussed with ED provider. Management plans discussed with the patient, family and they are in agreement.  CODE STATUS: Advance Directive Documentation     Most Recent Value  Type of Advance Directive  Healthcare Power of Attorney  Pre-existing out of facility DNR order (yellow form or pink MOST form)  -  "MOST" Form in Place?  -       TOTAL TIME TAKING CARE OF THIS PATIENT: 55 minutes.    Dustin Flock M.D on 12/23/2017 at 10:16 AM  Between 7am to 6pm - Pager - [phone]  After 6pm go to [email or web address] - password [email or web address]  [employer] Office  [phone]  CC: Primary care physician; Tracie Harrier, MD

## 2017-12-24 DIAGNOSIS — I639 Cerebral infarction, unspecified: Secondary | ICD-10-CM | POA: Diagnosis not present

## 2017-12-24 LAB — HEMOGLOBIN A1C
Hgb A1c MFr Bld: 6.2 % — ABNORMAL HIGH (ref 4.8–5.6)
MEAN PLASMA GLUCOSE: 131.24 mg/dL

## 2017-12-24 LAB — ECHOCARDIOGRAM COMPLETE
Height: 70 in
Weight: 2888 oz

## 2017-12-24 LAB — LIPID PANEL
CHOL/HDL RATIO: 4.9 ratio
Cholesterol: 163 mg/dL (ref 0–200)
HDL: 33 mg/dL — ABNORMAL LOW (ref >40)
LDL Cholesterol: 111 mg/dL — ABNORMAL HIGH (ref 0–99)
Triglycerides: 97 mg/dL (ref <150)
VLDL: 19 mg/dL (ref 0–40)

## 2017-12-24 MED ORDER — ATORVASTATIN CALCIUM 40 MG PO TABS: 40.0000 mg | ORAL_TABLET | Freq: Every day | ORAL | 11 refills | Status: AC

## 2017-12-24 NOTE — Discharge Summary (Signed)
[place] at [facility], 84 y.o., DOB 10-Mar-1931, MRN [id number]. Admission date: 12/23/2017 Discharge Date 12/24/2017 Primary MD Tracie Harrier, MD Admitting Physician Dustin Flock, MD  Admission Diagnosis  Stroke (cerebrum) [facility]) [I63.9] Cerebrovascular accident (CVA), unspecified mechanism [facility]) [I63.9]  Discharge Diagnosis   Active Problems: Acute CVA with slurred speech Essential hypertension Hyperlipidemia Seasonal allergies GERD  Hospital Course  Ronald Haney  is a 84 y.o. male with a known history of GERD, hyperlipidemia, hypertension, history of skin cancer presenting to the hospital with complaint being of slurred speech.  Patient was admitted and underwent a work-up MRI showed an acute stroke.  Patient stated that he was on aspirin until 2 weeks ago he stopped taking that after he was seen by his cardiologist who told him to stop aspirin.  Patient states that he had no bleeding or any other symptoms symptoms that warranted stopping his aspirin.  Patient underwent work-up with carotid Dopplers and echo of the heart.  Which showed no evidence of clot or obstruction.  Patient is started on aspirin.  He will be referred to outpatient speech therapy.             Consults  neurology  Significant Tests:  See full reports for all details    Mr Brain Wo Contrast  Result Date: 12/23/2017 CLINICAL DATA:  Stroke follow-up. Patient presents with acute onset slurred speech this morning EXAM: MRI HEAD WITHOUT CONTRAST MRA HEAD WITHOUT CONTRAST TECHNIQUE: Multiplanar, multiecho pulse sequences of the brain and surrounding structures were obtained without intravenous contrast. Angiographic images of the head were obtained using MRA technique without contrast. COMPARISON:  Head CT from earlier today FINDINGS: MRI HEAD FINDINGS Brain: 1 cm area of inferolateral left frontal cortex restricted diffusion. The appearance is consistent with acute  infarct. A small neighboring vessel is FLAIR hyperintense from slow or absent flow. Mild chronic small vessel ischemic type change in the cerebral white matter, age normal. Remote lacunar infarcts in the bilateral medial thalamus. Remote microhemorrhage in the deep posterior left cerebral white matter, nonspecific in isolation. Age normal brain volume. Vascular: Arterial findings below. Normal dural venous sinus flow voids Skull and upper cervical spine: Normal marrow signal. Sinuses/Orbits: Bilateral cataract resection. MRA HEAD FINDINGS Mild right A1 hypoplasia. P1 hypoplasia on the right more than left. The left vertebral artery is dominant. No emergent large vessel occlusion. No proximal flow limiting stenosis-poor signal on reformatted images at the bilateral M1 2 junction is considered artifactual when correlated with source images. The abnormal appearing vessel on above-described FLAIR imaging is below resolution of this exam. Negative for aneurysm IMPRESSION: Brain MRI: 1. ~1 cm acute infarct in the lateral left frontal cortex. A tiny neighboring vessel is occluded or slowly flowing by FLAIR sequence. 2. Remote lacunar infarcts in the bilateral thalamus. Intracranial MRA: No emergent finding or proximal flow limiting stenosis. The above vessel finding is below MRA resolution. Electronically Signed   By: Monte Fantasia M.D.   On: 12/23/2017 12:49   US Carotid Bilateral (at [facility] Only)  Result Date: 12/24/2017 CLINICAL DATA:  Stroke.  Hypertension, hyperlipidemia. EXAM: BILATERAL CAROTID DUPLEX ULTRASOUND TECHNIQUE: Pearline Cables scale imaging, color Doppler and duplex ultrasound was performed of bilateral carotid and vertebral arteries in the neck. COMPARISON:  None. TECHNIQUE: Quantification of carotid stenosis is based on velocity parameters that correlate the residual internal carotid diameter with NASCET-based stenosis levels, using the diameter of the distal internal carotid lumen as the  denominator  for stenosis measurement. The following velocity measurements were obtained: PEAK SYSTOLIC/END DIASTOLIC RIGHT ICA:                     123/27cm/sec CCA:                     [id number]/[id number]/[id number] SYSTOLIC ICA/CCA RATIO:  1.0 ECA:                     164cm/sec LEFT ICA:                     116/23cm/sec CCA:                     595/63OV/[id number] SYSTOLIC ICA/CCA RATIO:  1 ECA:                     122cm/sec FINDINGS: RIGHT CAROTID ARTERY: Smooth noncalcified plaque in the distal common carotid artery. Mildly irregular plaque in the carotid bulb extending into proximal internal and external carotid arteries, resulting in at least mild stenosis. Normal waveforms and color Doppler signal. Mild tortuosity of the ICA. RIGHT VERTEBRAL ARTERY:  Normal flow direction and waveform. LEFT CAROTID ARTERY: Irregular partially calcified plaque in the carotid bulb. No high-grade stenosis. Normal waveforms and color Doppler signal. LEFT VERTEBRAL ARTERY: Normal flow direction and waveform. IMPRESSION: 1. Bilateral carotid bifurcation plaque resulting in less than 50% diameter stenosis. 2.  Antegrade bilateral vertebral arterial flow. Electronically Signed   By: Lucrezia Europe M.D.   On: 12/24/2017 08:14   Mr Jodene Nam Head/brain PP Cm  Result Date: 12/23/2017 CLINICAL DATA:  Stroke follow-up. Patient presents with acute onset slurred speech this morning EXAM: MRI HEAD WITHOUT CONTRAST MRA HEAD WITHOUT CONTRAST TECHNIQUE: Multiplanar, multiecho pulse sequences of the brain and surrounding structures were obtained without intravenous contrast. Angiographic images of the head were obtained using MRA technique without contrast. COMPARISON:  Head CT from earlier today FINDINGS: MRI HEAD FINDINGS Brain: 1 cm area of inferolateral left frontal cortex restricted diffusion. The appearance is consistent with acute infarct. A small neighboring vessel is FLAIR hyperintense from slow or absent flow. Mild chronic small vessel ischemic type change in the cerebral white  matter, age normal. Remote lacunar infarcts in the bilateral medial thalamus. Remote microhemorrhage in the deep posterior left cerebral white matter, nonspecific in isolation. Age normal brain volume. Vascular: Arterial findings below. Normal dural venous sinus flow voids Skull and upper cervical spine: Normal marrow signal. Sinuses/Orbits: Bilateral cataract resection. MRA HEAD FINDINGS Mild right A1 hypoplasia. P1 hypoplasia on the right more than left. The left vertebral artery is dominant. No emergent large vessel occlusion. No proximal flow limiting stenosis-poor signal on reformatted images at the bilateral M1 2 junction is considered artifactual when correlated with source images. The abnormal appearing vessel on above-described FLAIR imaging is below resolution of this exam. Negative for aneurysm IMPRESSION: Brain MRI: 1. ~1 cm acute infarct in the lateral left frontal cortex. A tiny neighboring vessel is occluded or slowly flowing by FLAIR sequence. 2. Remote lacunar infarcts in the bilateral thalamus. Intracranial MRA: No emergent finding or proximal flow limiting stenosis. The above vessel finding is below MRA resolution. Electronically Signed   By: Monte Fantasia M.D.   On: 12/23/2017 12:49   Ct Head Code Stroke Wo Contrast  Result Date: 12/23/2017 CLINICAL DATA:  Code stroke. Sudden onset of slurred speech this morning EXAM: CT HEAD WITHOUT CONTRAST  TECHNIQUE: Contiguous axial images were obtained from the base of the skull through the vertex without intravenous contrast. COMPARISON:  None. FINDINGS: Brain: No evidence of acute infarction, hemorrhage, hydrocephalus, extra-axial collection or mass lesion/mass effect. Low-density in the right thalamus, history and discrete appearance compatible with chronicity. Otherwise age normal brain. Vascular: No hyperdense vessel or unexpected calcification. Skull: Negative Sinuses/Orbits: Negative Other: These results were called by telephone at the time of  interpretation on 12/23/2017 at 8:50 am to Dr. Larae Grooms , who verbally acknowledged these results. ASPECTS [facility] Stroke Program Early CT Score) - Ganglionic level infarction (caudate, lentiform nuclei, internal capsule, insula, M1-M3 cortex): 7 - Supraganglionic infarction (M4-M6 cortex): 3 Total score (0-10 with 10 being normal): 10 IMPRESSION: 1. No acute finding.  ASPECTS is 10. 2. Remote lacunar infarct in the right thalamus. Electronically Signed   By: Monte Fantasia M.D.   On: 12/23/2017 08:51       Today   Subjective:   Ronald Haney patient doing much better speech improved  Objective:   Blood pressure 137/69, pulse (!) 53, temperature 98.3 F (36.8 C), temperature source Oral, resp. rate 18, height 5\' 10"  (1.778 m), weight 81.9 kg (180 lb 8 oz), SpO2 98 %.  . No intake or output data in the 24 hours ending 12/24/17 1324  Exam VITAL SIGNS: Blood pressure 137/69, pulse (!) 53, temperature 98.3 F (36.8 C), temperature source Oral, resp. rate 18, height 5\' 10"  (1.778 m), weight 81.9 kg (180 lb 8 oz), SpO2 98 %.  GENERAL:  84 y.o.-year-old patient lying in the bed with no acute distress.  EYES: Pupils equal, round, reactive to light and accommodation. No scleral icterus. Extraocular muscles intact.  HEENT: Head atraumatic, normocephalic. Oropharynx and nasopharynx clear.  NECK:  Supple, no jugular venous distention. No thyroid enlargement, no tenderness.  LUNGS: Normal breath sounds bilaterally, no wheezing, rales,rhonchi or crepitation. No use of accessory muscles of respiration.  CARDIOVASCULAR: S1, S2 normal. No murmurs, rubs, or gallops.  ABDOMEN: Soft, nontender, nondistended. Bowel sounds present. No organomegaly or mass.  EXTREMITIES: No pedal edema, cyanosis, or clubbing.  NEUROLOGIC: Cranial nerves II through XII are intact. Muscle strength 5/5 in all extremities. Sensation intact. Gait not checked.  PSYCHIATRIC: The patient is alert and oriented x 3.  SKIN: No  obvious rash, lesion, or ulcer.   Data Review     CBC w Diff:  Lab Results  Component Value Date   WBC 7.6 12/23/2017   HGB 16.2 12/23/2017   HGB 16.0 06/22/2013   HCT 47.1 12/23/2017   HCT 46.2 06/22/2013   PLT 170 12/23/2017   PLT 153 06/22/2013   LYMPHOPCT 44 12/23/2017   MONOPCT 8 12/23/2017   EOSPCT 1 12/23/2017   BASOPCT 0 12/23/2017   CMP:  Lab Results  Component Value Date   NA 135 12/23/2017   NA 138 06/22/2013   K 4.0 12/23/2017   K 4.6 06/22/2013   CL 102 12/23/2017   CL 106 06/22/2013   CO2 23 12/23/2017   CO2 27 06/22/2013   BUN 34 (H) 12/23/2017   BUN 26 (H) 06/22/2013   CREATININE 1.12 12/23/2017   CREATININE 1.18 06/22/2013   PROT 7.7 12/23/2017   ALBUMIN 4.2 12/23/2017   BILITOT 1.1 12/23/2017   ALKPHOS 54 12/23/2017   AST 23 12/23/2017   ALT 18 12/23/2017  .  Micro Results No results found for this or any previous visit (from the past 240 hour(s)).  Code Status Orders  (From admission, onward)        Start     Ordered   12/23/17 1048  Full code  Continuous     12/23/17 1047    Code Status History    This patient has a current code status but no historical code status.    Advance Directive Documentation     Most Recent Value  Type of Advance Directive  Healthcare Power of Attorney  Pre-existing out of facility DNR order (yellow form or pink MOST form)  -  "MOST" Form in Place?  -          Follow-up Information    Hande, Vishwanath, MD Follow up in 6 day(s).   Specialty:  Internal Medicine Why:  f/u after stroke Contact information: [place] [address] [phone]           Discharge Medications   Allergies as of 12/24/2017   No Known Allergies     Medication List    TAKE these medications   aspirin 81 MG chewable tablet Chew 1 tablet by mouth daily.   atenolol 25 MG tablet Commonly known as:  TENORMIN Take 1 tablet by mouth daily.   atorvastatin 40 MG tablet Commonly  known as:  LIPITOR Take 1 tablet (40 mg total) by mouth daily.   fluticasone 50 MCG/ACT nasal spray Commonly known as:  FLONASE Place 2 sprays into both nostrils daily.   gemfibrozil 600 MG tablet Commonly known as:  LOPID Take 1 tablet by mouth 2 (two) times daily.   hydrochlorothiazide 25 MG tablet Commonly known as:  HYDRODIURIL Take 12.5 mg by mouth daily.   lisinopril 30 MG tablet Commonly known as:  PRINIVIL,ZESTRIL Take 30 mg by mouth daily.   omeprazole 20 MG capsule Commonly known as:  PRILOSEC Take 20 mg by mouth daily.          Total Time in preparing paper work, data evaluation and todays exam - 64 minutes  Dustin Flock M.D on 12/24/2017 at [place]  [phone]

## 2017-12-24 NOTE — Plan of Care (Signed)
  Problem: Education: Goal: Knowledge of General Education information will improve Outcome: Progressing   Problem: Health Behavior/Discharge Planning: Goal: Ability to manage health-related needs will improve Outcome: Progressing   Problem: Clinical Measurements: Goal: Ability to maintain clinical measurements within normal limits will improve Outcome: Progressing Goal: Will remain free from infection Outcome: Progressing Goal: Diagnostic test results will improve Outcome: Progressing Goal: Respiratory complications will improve Outcome: Progressing Goal: Cardiovascular complication will be avoided Outcome: Progressing   Problem: Activity: Goal: Risk for activity intolerance will decrease Outcome: Progressing   Problem: Nutrition: Goal: Adequate nutrition will be maintained Outcome: Progressing   Problem: Coping: Goal: Level of anxiety will decrease Outcome: Progressing   Problem: Elimination: Goal: Will not experience complications related to bowel motility Outcome: Progressing Goal: Will not experience complications related to urinary retention Outcome: Progressing   Problem: Pain Managment: Goal: General experience of comfort will improve Outcome: Progressing   Problem: Safety: Goal: Ability to remain free from injury will improve Outcome: Progressing   Problem: Skin Integrity: Goal: Risk for impaired skin integrity will decrease Outcome: Progressing   Problem: Education: Goal: Knowledge of disease or condition will improve Outcome: Progressing Goal: Knowledge of secondary prevention will improve Outcome: Progressing Goal: Knowledge of patient specific risk factors addressed and post discharge goals established will improve Outcome: Progressing

## 2017-12-24 NOTE — Plan of Care (Signed)
Pt, + for stroke, is d/ced home.  Pt continues to have some dysarthria but is much improved per family.  Only occurs occasionally - not a continuous deficit.  Pt has good strength, no visual or sensory deficits. Pt has been walking laps around the nursing station.  Has very good family support.  Nurse tech removed IV.  D/c instructions, f/u appt, and new medication reviewed.  Also provided education that if symptoms ever occur again, not to have son, wife or friend bring him to the hospital.  Advised him to call EMS in order to be fast-tracked through the ED.  Pt will have speech therapy outpt.  Think pt will do well, just advised him to tweak diet a little since A1C just above where they want it to be and lipid panel a little elevated. Pt walked out of hospital with son and wife.

## 2018-01-01 ENCOUNTER — Encounter: Payer: Self-pay | Admitting: Speech Pathology

## 2018-01-01 ENCOUNTER — Other Ambulatory Visit: Payer: Self-pay

## 2018-01-01 ENCOUNTER — Ambulatory Visit: Payer: Medicare Other | Attending: Internal Medicine | Admitting: Speech Pathology

## 2018-01-01 DIAGNOSIS — R471 Dysarthria and anarthria: Secondary | ICD-10-CM | POA: Insufficient documentation

## 2018-01-01 NOTE — Therapy (Signed)
[place] MAIN [facility] SERVICES [address] [place], [place], [address] Phone: [phone]   Fax:  [phone]  Speech Language Pathology Evaluation  Patient Details  Name: Ronald Haney MRN: [id number] Date of Birth: [birth year] Referring Provider: Dr. Ginette Pitman   Encounter Date: 01/01/2018  End of Session - 01/01/18 1200    Visit Number  1    Number of Visits  17    Date for SLP Re-Evaluation  02/26/18    SLP Start Time  1100    SLP Stop Time   1145    SLP Time Calculation (min)  45 min    Activity Tolerance  Patient tolerated treatment well       Past Medical History:  Diagnosis Date  . GERD (gastroesophageal reflux disease)   . Hyperlipemia   . Hypertension   . Skin cancer     Past Surgical History:  Procedure Laterality Date  . ANKLE SURGERY    . ccy    . TRANSURETHRAL RESECTION OF PROSTATE      There were no vitals filed for this visit.  Subjective Assessment - 01/01/18 1153    Subjective  Patient was very friendly and cooperative with all evaluation tasks, greeting SLP by name.    Patient is accompained by:  Family member wife [name], and daughter Manuela Schwartz    Currently in Pain?  No/denies      HPI: Per admitting H&P for CVA on 12/23/2017: Ronald Haney  is a 84 y.o. male with a known history of GERD, hyperlipidemia, hypertension, history of skin cancer presenting to the hospital with complaint being of slurred speech.  Patient was walking with his wife earlier this morning when he developed slurred speech.  Patient came to the emergency room.  CT scan of the head shows no acute stroke.  We are asked to admit the patient for further evaluation.  Due to his symptoms improving neurology recommended no TPA.  Patient states that he was on aspirin but taken off of it 2 weeks ago by his cardiologist.  He stated that he just told him that he did not need it. Patient denies any trouble with weakness in his extremities.       SLP  Evaluation [facility] - 01/01/18 1153      SLP Visit Information   SLP Received On  01/01/18    Referring Provider  Dr. Ginette Pitman    Onset Date  12/23/2017    Medical Diagnosis  CVA      Oral Motor/Sensory Function   Overall Oral Motor/Sensory Function  Appears within functional limits for tasks assessed      Motor Speech   Overall Motor Speech  Impaired    Respiration  Impaired    Level of Impairment  Conversation    Phonation  Normal    Resonance  Within functional limits    Articulation  Impaired    Level of Impairment  Conversation    Intelligibility  Intelligibility reduced    Word  75-100% accurate    Phrase  75-100% accurate    Sentence  75-100% accurate    Conversation  75-100% accurate with increased articulatory complexity    Phonation  WFL       Additional objective measures:   Maximum phonation time: average if 11.69 seconds (11.76 seconds, 14.89 seconds, 8.43 seconds); Mean Norm for ages 43-90: 22.31 seconds  Coordination: Diadochokinetic Rates/ Sequential Motion Rates:  /puh/: 2.4 per second; Normative values for males ages 35-86: 6.7  Family Member Consulted  wife [name], and daughter Manuela Schwartz       Patient will benefit from skilled therapeutic intervention in order to improve the following deficits and impairments:   Dysarthria    Problem List Patient Active Problem List   Diagnosis Date Noted  . CVA (cerebral vascular accident) ([place]) 12/23/2017    Tayron Hunnell, [place] 01/01/2018, 1:10 PM  [place] MAIN [facility] SERVICES [address] [place], [place], [address] Phone: [phone]   Fax:  [phone]  Name: REILEY KEISLER MRN: [id number] Date of Birth: [birth year]  [place] MAIN [facility] SERVICES [address] [place], [place], [address] Phone: [phone]   Fax:  [phone]  Speech Language Pathology Evaluation  Patient Details  Name: Ronald Haney MRN: [id number] Date of Birth: [birth year] Referring Provider: Dr. Ginette Pitman   Encounter Date: 01/01/2018  End of Session - 01/01/18 1200    Visit Number  1    Number of Visits  17    Date for SLP Re-Evaluation  02/26/18    SLP Start Time  1100    SLP Stop Time   1145    SLP Time Calculation (min)  45 min    Activity Tolerance  Patient tolerated treatment well       Past Medical History:  Diagnosis Date  . GERD (gastroesophageal reflux disease)   . Hyperlipemia   . Hypertension   . Skin cancer     Past Surgical History:  Procedure Laterality Date  . ANKLE SURGERY    . ccy    . TRANSURETHRAL RESECTION OF PROSTATE      There were no vitals filed for this visit.  Subjective Assessment - 01/01/18 1153    Subjective  Patient was very friendly and cooperative with all evaluation tasks, greeting SLP by name.    Patient is accompained by:  Family member wife [name], and daughter Manuela Schwartz    Currently in Pain?  No/denies      HPI: Per admitting H&P for CVA on 12/23/2017: Ronald Haney  is a 84 y.o. male with a known history of GERD, hyperlipidemia, hypertension, history of skin cancer presenting to the hospital with complaint being of slurred speech.  Patient was walking with his wife earlier this morning when he developed slurred speech.  Patient came to the emergency room.  CT scan of the head shows no acute stroke.  We are asked to admit the patient for further evaluation.  Due to his symptoms improving neurology recommended no TPA.  Patient states that he was on aspirin but taken off of it 2 weeks ago by his cardiologist.  He stated that he just told him that he did not need it. Patient denies any trouble with weakness in his extremities.       SLP  Evaluation [facility] - 01/01/18 1153      SLP Visit Information   SLP Received On  01/01/18    Referring Provider  Dr. Ginette Pitman    Onset Date  12/23/2017    Medical Diagnosis  CVA      Oral Motor/Sensory Function   Overall Oral Motor/Sensory Function  Appears within functional limits for tasks assessed      Motor Speech   Overall Motor Speech  Impaired    Respiration  Impaired    Level of Impairment  Conversation    Phonation  Normal    Resonance  Within functional limits    Articulation  Impaired    Level of Impairment  Conversation    Intelligibility  Intelligibility reduced    Word  75-100% accurate    Phrase  75-100% accurate    Sentence  75-100% accurate    Conversation  75-100% accurate with increased articulatory complexity    Phonation  WFL       Additional objective measures:   Maximum phonation time: average if 11.69 seconds (11.76 seconds, 14.89 seconds, 8.43 seconds); Mean Norm for ages 43-90: 22.31 seconds  Coordination: Diadochokinetic Rates/ Sequential Motion Rates:  /puh/: 2.4 per second; Normative values for males ages 35-86: 6.7

## 2018-01-04 ENCOUNTER — Ambulatory Visit: Payer: Medicare Other | Admitting: Speech Pathology

## 2018-01-08 ENCOUNTER — Encounter: Payer: Self-pay | Admitting: Speech Pathology

## 2018-01-08 ENCOUNTER — Ambulatory Visit: Payer: Medicare Other | Admitting: Speech Pathology

## 2018-01-08 DIAGNOSIS — R471 Dysarthria and anarthria: Secondary | ICD-10-CM | POA: Diagnosis not present

## 2018-01-08 NOTE — Therapy (Signed)
[place] [facility] MAIN [facility] SERVICES [address] [place], [place], [address] Phone: [phone]   Fax:  [phone]  Speech Language Pathology Treatment  Patient Details  Name: Ronald Haney MRN: [id number] Date of Birth: 1930-08-06 Referring Provider: Dr. Marcello Fennel   Encounter Date: 01/08/2018  End of Session - 01/08/18 0911    Visit Number  2    Number of Visits  17    Date for SLP Re-Evaluation  02/26/18    SLP Start Time  0800    SLP Stop Time   0853    SLP Time Calculation (min)  53 min    Activity Tolerance  Patient tolerated treatment well       Past Medical History:  Diagnosis Date  . GERD (gastroesophageal reflux disease)   . Hyperlipemia   . Hypertension   . Skin cancer     Past Surgical History:  Procedure Laterality Date  . ANKLE SURGERY    . ccy    . TRANSURETHRAL RESECTION OF PROSTATE      There were no vitals filed for this visit.  Subjective Assessment - 01/08/18 0907    Subjective  Patient was very cooperative with all treatment tasks, smiling and asking clarifying questions throughout treatment.            ADULT SLP TREATMENT - 01/08/18 0001      General Information   HPI  Ronald Haney  is a 84 y.o. male with a known history of GERD, hyperlipidemia, hypertension, history of skin cancer presenting to the hospital with complaint being of slurred speech.  Patient was walking with his wife earlier this morning when he developed slurred speech.  Patient came to the emergency room.  CT scan of the head shows no acute stroke.  We are asked to admit the patient for further evaluation.  Due to his symptoms improving neurology recommended no TPA.  Patient states that he was on aspirin but taken off of it 2 weeks ago by his cardiologist.  He stated that he just told him that he did not need it. Patient denies any trouble with weakness in his extremities.      Treatment Provided   Treatment provided  Cognitive-Linquistic       Progression Toward Goals   Progression toward goals  Progressing toward goals     Objective Treatment Data:  Patient attempted diaphragmatic/abdominal breathing x10, performed with accuracy 1/10.  Patient read 3 syllable words aloud with 95% accuracy given min verbal cues for overarticulation, increased loudness and pacing. Patient read 4 syllable words aloud with 81% accuracy given min verbal cues for overarticulation, increased loudness and pacing. Patient read 5 syllable words aloud with 80% accuracy given min verbal cues for overarticulation, increased loudness and pacing. Patient read /s/ blend phrases with 100% accuracy given  min verbal cues for overarticulation, increased loudness and pacing. Patient read /s/ blend sentences with 85% accuracy given min verbal cues for overarticulation, increased loudness and pacing (self corrected x6).  Patient self corrected errors to repair communication breakdown x8 in conversation, demonstrating emerging independence using pacing and overarticulation at the conversation level.  SLP Education - 01/08/18 0910    Education Details  Breath support, abdominal/diaphragmatic breathing exercises, overarticulation and pacing     Person(s) Educated  Patient    Methods  Explanation;Demonstration;Verbal cues    Comprehension  Verbalized understanding;Returned demonstration;Verbal cues required;Need further instruction         SLP Long Term Goals - 01/01/18  1204      SLP LONG TERM GOAL #1   Title  Patient will produce multisyllabic words with 90% accuracy given verbal and visual cues.    Baseline  60% accuracy    Time  8    Period  Weeks    Status  New    Target Date  02/26/18      SLP LONG TERM GOAL #2   Title  Patient will use appropriate articulatory accuracy and speech rate when reading paragraphs with 90% accuracy and min verbal and visual cues.    Baseline  50% accuracy    Time  8    Period  Weeks    Status  New    Target Date   02/26/18      SLP LONG TERM GOAL #3   Title  Patient will use pacing and diaphragmatic breathing to improve breath support for speech with 80% accuracy given min verbal and visual cues.    Baseline  20%    Time  8    Period  Weeks    Status  New    Target Date  02/26/18      SLP LONG TERM GOAL #4   Title  Patient will use appropriate articulatory accuracy and speech rate in a 5 minute converstational speech sample with 90% accuracy and min verbal and visual cues.    Baseline  40%    Time  8    Period  Weeks    Status  New    Target Date  02/26/18       Plan - 01/08/18 0913    Clinical Impression Statement  Patient demonstrated improved articulatory accuracy given min verbal and visual cues for pacing and overarticulation at the word, phrase and sentence level. Patient's conversational speech is 90% intelligible. Patient demonstrated emerging independence using pacing and overarticulation to repair communication breakdown in conversation.    Speech Therapy Frequency  2x / week    Duration  Other (comment)    Treatment/Interventions  Patient/family education;SLP instruction and feedback;Compensatory strategies    Potential to Achieve Goals  Good    SLP Home Exercise Plan  Articulation and breath support exercises including overarticulation and pacing reading /s/ blend sentences    Consulted and Agree with Plan of Care  Patient       Patient will benefit from skilled therapeutic intervention in order to improve the following deficits and impairments:   Dysarthria and anarthria    Problem List Patient Active Problem List   Diagnosis Date Noted  . CVA (cerebral vascular accident) ([facility]) 12/23/2017    Ronald Eslinger, MA, CCC-SLP 01/08/2018, 9:19 AM  [place] [facility] MAIN [facility] SERVICES [address] [place], [place], [address] Phone: [phone]   Fax:  [phone]   Name: Ronald Haney MRN: [id number] Date of Birth: Aug 17, 1930

## 2018-01-11 ENCOUNTER — Encounter: Payer: Self-pay | Admitting: Speech Pathology

## 2018-01-11 ENCOUNTER — Ambulatory Visit: Payer: Medicare Other | Admitting: Speech Pathology

## 2018-01-11 DIAGNOSIS — R471 Dysarthria and anarthria: Secondary | ICD-10-CM | POA: Diagnosis not present

## 2018-01-11 NOTE — Therapy (Signed)
[facility] MAIN [facility] SERVICES [address] [place], [place], [address] Phone: [phone]   Fax:  [phone]  Speech Language Pathology Treatment  Patient Details  Name: Ronald Haney MRN: [id number] Date of Birth: [birth year] Referring Provider: Dr. Marcello Fennel   Encounter Date: 01/11/2018  End of Session - 01/11/18 1131    Visit Number  3    Number of Visits  17    Date for SLP Re-Evaluation  02/26/18    SLP Start Time  1002    SLP Stop Time   1059    SLP Time Calculation (min)  57 min    Activity Tolerance  Patient tolerated treatment well       Past Medical History:  Diagnosis Date  . GERD (gastroesophageal reflux disease)   . Hyperlipemia   . Hypertension   . Skin cancer     Past Surgical History:  Procedure Laterality Date  . ANKLE SURGERY    . ccy    . TRANSURETHRAL RESECTION OF PROSTATE      There were no vitals filed for this visit.  Subjective Assessment - 01/11/18 1129    Subjective  Patient was very pleasant, smiling often, eager to participate in treatment tasks.            ADULT SLP TREATMENT - 01/11/18 0001      General Information   HPI  Ronald Haney  is a 84 y.o. male with a known history of GERD, hyperlipidemia, hypertension, history of skin cancer presenting to the hospital with complaint being of slurred speech.  Patient was walking with his wife earlier this morning when he developed slurred speech.  Patient came to the emergency room.  CT scan of the head shows no acute stroke.  We are asked to admit the patient for further evaluation.  Due to his symptoms improving neurology recommended no TPA.  Patient states that he was on aspirin but taken off of it 2 weeks ago by his cardiologist.  He stated that he just told him that he did not need it. Patient denies any trouble with weakness in his extremities.      Treatment Provided   Treatment provided  Cognitive-Linquistic      Progression Toward Goals    Progression toward goals  Progressing toward goals      Objective treatment data:  Patient read initial /s/ multisyllabic words aloud with 90% accuracy given min verbal cues. Patient read initial /z/ multisyllabic words aloud with 75% accuracy and bisyllabic words with 90% accuracy given min verbal cues. Patient read initial /s// short sentences with 90% accuracy aloud given min verbal and visual cues for pacing. Patient read short paragraphs with 85% accuracy given min verbal and visual cues for pacing and overarticulation. Patient produced conversational speech with 90% intelligibility using pacing and overarticulation given min visual cues. Noted patient independently use compensatory strategies to self correct communication breakdown/ errors x8 throughout a 10 minute conversational sample.   SLP Education - 01/11/18 1130    Education Details  Pacing, overarticulation, breath support exercises    Person(s) Educated  Patient    Methods  Explanation;Demonstration;Verbal cues    Comprehension  Verbalized understanding;Returned demonstration;Verbal cues required;Need further instruction         SLP Long Term Goals - 01/01/18 1204      SLP LONG TERM GOAL #1   Title  Patient will produce multisyllabic words with 90% accuracy given verbal and visual cues.    Baseline  60% accuracy    Time  8    Period  Weeks    Status  New    Target Date  02/26/18      SLP LONG TERM GOAL #2   Title  Patient will use appropriate articulatory accuracy and speech rate when reading paragraphs with 90% accuracy and min verbal and visual cues.    Baseline  50% accuracy    Time  8    Period  Weeks    Status  New    Target Date  02/26/18      SLP LONG TERM GOAL #3   Title  Patient will use pacing and diaphragmatic breathing to improve breath support for speech with 80% accuracy given min verbal and visual cues.    Baseline  20%    Time  8    Period  Weeks    Status  New    Target Date  02/26/18       SLP LONG TERM GOAL #4   Title  Patient will use appropriate articulatory accuracy and speech rate in a 5 minute converstational speech sample with 90% accuracy and min verbal and visual cues.    Baseline  40%    Time  8    Period  Weeks    Status  New    Target Date  02/26/18       Plan - 01/11/18 1132    Clinical Impression Statement Patient demonstrated improved independence using pacing and overarticulation to repair communication breakdown in conversation. Patient's conversational speech was 90% intelligible throughout treatment. Greatest articulatory challenges that contribute to decreased intelligibility and communication breakdown include words that begin with /sl/, /z/, and  /sh/.    Speech Therapy Frequency  2x / week    Duration  Other (comment)    Treatment/Interventions  Patient/family education;SLP instruction and feedback;Compensatory strategies    Potential to Achieve Goals  Good    Potential Considerations  Ability to learn/carryover information;Family/community support;Cooperation/participation level    SLP Home Exercise Plan  Articulation and breath support exercises including overarticulation and pacing reading /sh/. /z/ and /s/  blend words and sentences    Consulted and Agree with Plan of Care  Patient       Patient will benefit from skilled therapeutic intervention in order to improve the following deficits and impairments:   Dysarthria and anarthria    Problem List Patient Active Problem List   Diagnosis Date Noted  . CVA (cerebral vascular accident) ([facility]) 12/23/2017    [name], MA, CCC-SLP 01/11/2018, 11:37 AM  [place] [facility] MAIN [facility] SERVICES [address] [place], [place], [address] Phone: [phone]   Fax:  [phone]   Name: Ronald Haney MRN: [id number] Date of Birth: Jan 07, 1931

## 2018-01-15 ENCOUNTER — Ambulatory Visit: Payer: Medicare Other | Admitting: Speech Pathology

## 2018-01-15 ENCOUNTER — Encounter: Payer: Self-pay | Admitting: Speech Pathology

## 2018-01-15 DIAGNOSIS — R471 Dysarthria and anarthria: Secondary | ICD-10-CM

## 2018-01-15 NOTE — Therapy (Signed)
SLP Long Term Goals - 01/01/18 1204      SLP LONG TERM GOAL #1   Title  Patient will produce multisyllabic words with 90% accuracy given verbal and visual cues.    Baseline  60% accuracy    Time  8    Period  Weeks    Status  New    Target Date  02/26/18      SLP LONG TERM GOAL #2   Title  Patient will use appropriate articulatory accuracy and speech rate when reading paragraphs with 90% accuracy and min verbal and visual cues.    Baseline  50% accuracy    Time  8    Period  Weeks    Status  New    Target Date   02/26/18      SLP LONG TERM GOAL #3   Title  Patient will use pacing and diaphragmatic breathing to improve breath support for speech with 80% accuracy given min verbal and visual cues.    Baseline  20%    Time  8    Period  Weeks    Status  New    Target Date  02/26/18      SLP LONG TERM GOAL #4   Title  Patient will use appropriate articulatory accuracy and speech rate in a 5 minute converstational speech sample with 90% accuracy and min verbal and visual cues.    Baseline  40%    Time  8    Period  Weeks    Status  New    Target Date  02/26/18       Plan - 01/15/18 2637    Clinical Impression Statement  Patient demonstrated improved independence producing initial /z/ in 1 & 2 syllable words as well as short initial /z/ sentences given min verbal cues. Patient demonstrated improved use of compensatory strategies during sustained speech in conversation resulting in improved intelligibility.    Speech Therapy Frequency  2x / week    Duration  Other (comment)    Treatment/Interventions  Patient/family education;SLP instruction and feedback;Compensatory strategies    Potential to Achieve Goals  Good    Potential Considerations  Ability to learn/carryover information;Family/community support;Cooperation/participation level    SLP Home Exercise Plan  Articulation and breath support exercises including overarticulation and pacing reading /sh/. /z/ and /s/  blend words and sentences    Consulted and Agree with Plan of Care  Patient       Patient will benefit from skilled therapeutic intervention in order to improve the following deficits and impairments:   Dysarthria and anarthria    Problem List Patient Active Problem List   Diagnosis Date Noted  . CVA (cerebral vascular accident) ([place]) 12/23/2017    Ronald Amsler, MA, [place] 01/15/2018, 9:25 AM  [place] MAIN [facility] SERVICES [address] [place], [place], [address] Phone:  [phone]   Fax:  [phone]   Name: Ronald Haney MRN: [id number] Date of Birth: 08/03/1930  [place] MAIN [facility] SERVICES [address] [place], [place], [address] Phone: [phone]   Fax:  [phone]  Speech Language Pathology Treatment  Patient Details  Name: Ronald Haney MRN: [id number] Date of Birth: 24-May-1931 Referring Provider: Dr. Ginette Pitman   Encounter Date: 01/15/2018  End of Session - 01/15/18 0922    Visit Number  4    Number of Visits  17    Date for SLP Re-Evaluation  02/26/18    SLP Start Time  0800    SLP Stop Time   1287    SLP Time Calculation (min)  57 min    Activity Tolerance  Patient tolerated treatment well       Past Medical History:  Diagnosis Date  . GERD (gastroesophageal reflux disease)   . Hyperlipemia   . Hypertension   . Skin cancer     Past Surgical History:  Procedure Laterality Date  . ANKLE SURGERY    . ccy    . TRANSURETHRAL RESECTION OF PROSTATE      There were no vitals filed for this visit.  Subjective Assessment - 01/15/18 0920    Subjective  Patient was pleasant and cooperative with all treatment tasks, asking clarifying questions.    Currently in Pain?  No/denies            ADULT SLP TREATMENT - 01/15/18 0001      General Information   Behavior/Cognition  Alert;Cooperative;Pleasant mood    HPI  Ronald Haney  is a 84 y.o. male with a known history of GERD, hyperlipidemia, hypertension, history of skin cancer presenting to the hospital with complaint being of slurred speech.  Patient was walking with his wife earlier this morning when he developed slurred speech.  Patient came to the emergency room.  CT scan of the head shows no acute stroke.  We are asked to admit the patient for further evaluation.  Due to his symptoms improving neurology recommended no TPA.  Patient states that he was on aspirin but taken off of it 2 weeks ago by his cardiologist.  He stated that he just told him that he did not need it. Patient denies any trouble with weakness in his extremities.      Treatment Provided   Treatment provided  Cognitive-Linquistic      Progression Toward Goals   Progression toward goals  Progressing toward goals      Objective treatment data:  Patient read initial /s/ sentences aloud with 100% accuracy given min verbal cues for overarticulation and 88% accuracy independently. Patient read initial /sl/ sentences aloud with 92% accuracy given min verbal cues and 69% accuracy independently. Patient read initial /z/ 1 syllable words with 92% accuracy given min verbal cues and 76% accuracy independently. Patient read initial /z/ short sentences with 75% accuracy given min to mod verbal cues and 33% accuracy independently. Patient read 8-10 word sentences aloud with 83% accuracy given min to mod verbal cues and 67% accuracy independently. Patient used compensatory strategies of overarticulation, pacing and abdominal breathing throughout a 10 minute conversational speech sample resulting in independent self-corrections x12 and min verbal cues to use strategies x5 to repair conversational breakdown and decreased intelligibility.   SLP Education - 01/15/18 0921    Education Details  Pacing, overarticulation, breath support exercises    Person(s) Educated  Patient    Methods  Explanation;Demonstration;Verbal cues;Handout    Comprehension  Verbalized understanding;Returned demonstration;Verbal cues required;Need further instruction

## 2018-01-18 ENCOUNTER — Encounter: Payer: Self-pay | Admitting: Speech Pathology

## 2018-01-18 ENCOUNTER — Ambulatory Visit: Payer: Medicare Other | Admitting: Speech Pathology

## 2018-01-18 DIAGNOSIS — R471 Dysarthria and anarthria: Secondary | ICD-10-CM

## 2018-01-18 NOTE — Therapy (Signed)
[facility] MAIN [facility] SERVICES [address] [place], [place], [address] Phone: [phone]   Fax:  [phone]  Speech Language Pathology Treatment  Patient Details  Name: Ronald Haney MRN: [id number] Date of Birth: [birth year] Referring Provider: Dr. Marcello Fennel   Encounter Date: 01/18/2018  End of Session - 01/18/18 1130    Visit Number  5    Number of Visits  17    Date for SLP Re-Evaluation  02/26/18    SLP Start Time  0900    SLP Stop Time   1000    SLP Time Calculation (min)  60 min    Activity Tolerance  Patient tolerated treatment well       Past Medical History:  Diagnosis Date  . GERD (gastroesophageal reflux disease)   . Hyperlipemia   . Hypertension   . Skin cancer     Past Surgical History:  Procedure Laterality Date  . ANKLE SURGERY    . ccy    . TRANSURETHRAL RESECTION OF PROSTATE      There were no vitals filed for this visit.  Subjective Assessment - 01/18/18 1112    Subjective  Patient reported he thinks he sounds almost like he did before his stroke and stated his wife thinks he sounds much better.             ADULT SLP TREATMENT - 01/18/18 0001      General Information   Behavior/Cognition  Alert;Cooperative;Pleasant mood    HPI  Ronald Haney  is a 84 y.o. male with a known history of GERD, hyperlipidemia, hypertension, history of skin cancer presenting to the hospital with complaint being of slurred speech.  Patient was walking with his wife earlier this morning when he developed slurred speech.  Patient came to the emergency room.  CT scan of the head shows no acute stroke.  We are asked to admit the patient for further evaluation.  Due to his symptoms improving neurology recommended no TPA.  Patient states that he was on aspirin but taken off of it 2 weeks ago by his cardiologist.  He stated that he just told him that he did not need it. Patient denies any trouble with weakness in his extremities.      Treatment Provided   Treatment provided  Cognitive-Linquistic      Cognitive-Linquistic Treatment   Treatment focused on  Dysarthria    Skilled Treatment Patient read single syllable initial /z/ words aloud with 83% accuracy given min verbal cues.   Patient read bisyllabic intial /z/ words alound with 92% accuracy given min verbal cues, (self correct x3).  Patient read multisyllabic initial voiceless /th/ words aloud with 92% accuracy given min cues and 5-7 word sentences containing initial voiceless /th/ words with 80% accuracy given min cues, self-correct x2.  Patient read initial /sl/ 5-7 word sentences aloud with 80% accuracy given min cues for overarticulation and pacing.  Patient used compensatory strategies of overarticulation and pacing in 2 five minute conversational samples requiring cues x2 in first sample and x1 in second sample.       Assessment / Recommendations / Plan   Plan  Continue with current plan of care      Progression Toward Goals   Progression toward goals  Progressing toward goals       SLP Education - 01/18/18 1129    Education Details  Pacing, overarticulation, breath support exercises    Person(s) Educated  Patient    Methods  Explanation;Demonstration;Verbal cues;Handout    Comprehension  Verbalized understanding;Returned demonstration;Verbal cues required;Need further instruction         SLP Long Term Goals - 01/01/18 1204      SLP LONG TERM GOAL #1   Title  Patient will produce multisyllabic words with 90% accuracy given verbal and visual cues.    Baseline  60% accuracy    Time  8    Period  Weeks    Status  New    Target Date  02/26/18      SLP LONG TERM GOAL #2   Title  Patient will use appropriate articulatory accuracy and speech rate when reading paragraphs with 90% accuracy and min verbal and visual cues.    Baseline  50% accuracy    Time  8    Period  Weeks    Status  New    Target Date  02/26/18      SLP LONG TERM GOAL #3    Title  Patient will use pacing and diaphragmatic breathing to improve breath support for speech with 80% accuracy given min verbal and visual cues.    Baseline  20%    Time  8    Period  Weeks    Status  New    Target Date  02/26/18      SLP LONG TERM GOAL #4   Title  Patient will use appropriate articulatory accuracy and speech rate in a 5 minute converstational speech sample with 90% accuracy and min verbal and visual cues.    Baseline  40%    Time  8    Period  Weeks    Status  New    Target Date  02/26/18       Plan - 01/18/18 1130    Speech Therapy Frequency  2x / week    Duration  Other (comment) 8 weeks    Treatment/Interventions  Patient/family education;SLP instruction and feedback;Compensatory strategies    Potential to Achieve Goals  Good    Potential Considerations  Ability to learn/carryover information;Family/community support;Cooperation/participation level    SLP Home Exercise Plan  Continue to focus on initial /s/ blends, /z/ words & sentences, and include similar word pairs for practice using compensatory strategies of pacing & overarticulation.    Consulted and Agree with Plan of Care  Patient       Patient will benefit from skilled therapeutic intervention in order to improve the following deficits and impairments:   Dysarthria and anarthria    Problem List Patient Active Problem List   Diagnosis Date Noted  . CVA (cerebral vascular accident) (HCC) 12/23/2017    Greggory Safranek, MA, CCC-SLP 01/18/2018, 11:32 AM  [facility] MAIN [facility] SERVICES [address] [place], [place], [address] Phone: [phone]   Fax:  [phone]   Name: Ronald Haney MRN: [id number] Date of Birth: 1930/12/05

## 2018-01-22 ENCOUNTER — Encounter: Payer: Self-pay | Admitting: Speech Pathology

## 2018-01-22 ENCOUNTER — Ambulatory Visit: Payer: Medicare Other | Admitting: Speech Pathology

## 2018-01-22 DIAGNOSIS — R471 Dysarthria and anarthria: Secondary | ICD-10-CM | POA: Diagnosis not present

## 2018-01-22 NOTE — Therapy (Signed)
[facility] MAIN [facility] SERVICES [address] [place], [place], [address] Phone: [phone]   Fax:  [phone]  Speech Language Pathology Treatment  Patient Details  Name: Ronald Haney MRN: [id number] Date of Birth: 09/24/1930 Referring Provider: Dr. Marcello Fennel   Encounter Date: 01/22/2018  End of Session - 01/22/18 1449    Visit Number  6    Number of Visits  17    Date for SLP Re-Evaluation  02/26/18    SLP Start Time  0901    SLP Stop Time   0959    SLP Time Calculation (min)  58 min    Activity Tolerance  Patient tolerated treatment well       Past Medical History:  Diagnosis Date  . GERD (gastroesophageal reflux disease)   . Hyperlipemia   . Hypertension   . Skin cancer     Past Surgical History:  Procedure Laterality Date  . ANKLE SURGERY    . ccy    . TRANSURETHRAL RESECTION OF PROSTATE      There were no vitals filed for this visit.  Subjective Assessment - 01/22/18 1434    Subjective  Patient reports he is doing well today. He reports his wife thinks he sounds like he is much better.    Currently in Pain?  No/denies            ADULT SLP TREATMENT - 01/22/18 0001      General Information   Behavior/Cognition  Alert;Cooperative;Pleasant mood    HPI  Masiah Broker  is a 84 y.o. male with a known history of GERD, hyperlipidemia, hypertension, history of skin cancer presenting to the hospital with complaint being of slurred speech.  Patient was walking with his wife earlier this morning when he developed slurred speech.  Patient came to the emergency room.  CT scan of the head shows no acute stroke.  We are asked to admit the patient for further evaluation.  Due to his symptoms improving neurology recommended no TPA.  Patient states that he was on aspirin but taken off of it 2 weeks ago by his cardiologist.  He stated that he just told him that he did not need it. Patient denies any trouble with weakness in his extremities.       Treatment Provided   Treatment provided  Cognitive-Linquistic      Cognitive-Linquistic Treatment   Treatment focused on  Dysarthria    Skilled Treatment Patient read multisyllabic initial /s/ words aloud with 96% accuracy independently and initial /z/ multisyllabic words with 90% accuracy (35/39) with intermittent min verbal cues.    Patient read similar word pairs aloud with 96% accuracy independently with min verbal cues for increased vocal intensity, s/c noted x1.  Patient used appropriate articulatory accuracy and speech rate when reading paragraphs aloud with 100% accuracy, noted s/c x5.  Patient used compensatory strategies of diaphragmatic breathing and pacing to improve breath support for speech with 90% accuracy given intermittent verbal cues. Patient largely independent, noted s/c x3.  Patient used appropriate articulatory accuracy and speech rate in a five minute conversational sample requiring min verbal cues x1, noted s/c x8.      Assessment / Recommendations / Plan   Plan  Continue with current plan of care, d/c after next visit if  Patient demonstrates consistent performance and continues to meet goals.     Progression Toward Goals   Progression toward goals  Progressing toward goals       SLP  Education - 01/22/18 1449    Education Details  Pacing, overarticulation, increased vocal intensity    Person(s) Educated  Patient    Methods  Explanation;Demonstration;Verbal cues    Comprehension  Verbalized understanding;Returned demonstration         SLP Long Term Goals - 01/01/18 1204      SLP LONG TERM GOAL #1   Title  Patient will produce multisyllabic words with 90% accuracy given verbal and visual cues.    Baseline  60% accuracy    Time  8    Period  Weeks    Status  New    Target Date  02/26/18      SLP LONG TERM GOAL #2   Title  Patient will use appropriate articulatory accuracy and speech rate when reading paragraphs with 90% accuracy and min verbal  and visual cues.    Baseline  50% accuracy    Time  8    Period  Weeks    Status  New    Target Date  02/26/18      SLP LONG TERM GOAL #3   Title  Patient will use pacing and diaphragmatic breathing to improve breath support for speech with 80% accuracy given min verbal and visual cues.    Baseline  20%    Time  8    Period  Weeks    Status  New    Target Date  02/26/18      SLP LONG TERM GOAL #4   Title  Patient will use appropriate articulatory accuracy and speech rate in a 5 minute converstational speech sample with 90% accuracy and min verbal and visual cues.    Baseline  40%    Time  8    Period  Weeks    Status  New    Target Date  02/26/18       Plan - 01/22/18 1450    Clinical Impression Statement  Patient demonstrated consistent improved independence using overarticulation, pacing and increased vocal intensity to improve intelligibility of speech, as seen in improved accuracy and decreased level of cueing at every level from multisyllabic words to conversational speech. Patient repaired nearly all instances of communication breakdown with independent use of his compensatory strategies.     Speech Therapy Frequency  2x / week    Duration  One additional visit 8 weeks    Treatment/Interventions  Patient/family education;SLP instruction and feedback;Compensatory strategies    Potential to Achieve Goals  Good    Potential Considerations  Ability to learn/carryover information;Family/community support;Cooperation/participation level    Consulted and Agree with Plan of Care  Patient       Patient will benefit from skilled therapeutic intervention in order to improve the following deficits and impairments:   Dysarthria and anarthria    Problem List Patient Active Problem List   Diagnosis Date Noted  . CVA (cerebral vascular accident) (HCC) 12/23/2017    Dimple Bastyr, MA, CCC-SLP 01/22/2018, 2:56 PM  [facility] MAIN [facility]  SERVICES [address] [place], [place], [address] Phone: [phone]   Fax:  [phone]   Name: Ronald Haney MRN: [id number] Date of Birth: April 14, 1931

## 2018-01-25 ENCOUNTER — Ambulatory Visit: Payer: Medicare Other | Attending: Internal Medicine | Admitting: Speech Pathology

## 2018-01-25 ENCOUNTER — Encounter: Payer: Self-pay | Admitting: Speech Pathology

## 2018-01-25 DIAGNOSIS — R471 Dysarthria and anarthria: Secondary | ICD-10-CM

## 2018-01-25 NOTE — Therapy (Signed)
[facility] MAIN [facility] SERVICES [address] [place], [place], [address] Phone: [phone]   Fax:  [phone]  Speech Language Pathology Treatment  Patient Details  Name: IGNATIUS KIFER MRN: [id number] Date of Birth: 1931/04/11 Referring Provider: Dr. Marcello Fennel   Encounter Date: 01/25/2018  End of Session - 01/25/18 1150    Visit Number  7    Number of Visits  17    Date for SLP Re-Evaluation  02/26/18    SLP Start Time  0800    SLP Stop Time   0859    SLP Time Calculation (min)  59 min    Activity Tolerance  Patient tolerated treatment well       Past Medical History:  Diagnosis Date  . GERD (gastroesophageal reflux disease)   . Hyperlipemia   . Hypertension   . Skin cancer     Past Surgical History:  Procedure Laterality Date  . ANKLE SURGERY    . ccy    . TRANSURETHRAL RESECTION OF PROSTATE      There were no vitals filed for this visit.  Subjective Assessment - 01/25/18 1141    Subjective  Patient reports he feels has recovered from his stroke and feels very blessed. He states his wife says he sounds like he is back to normal.    Patient is accompained by:  Family member    Currently in Pain?  No/denies            ADULT SLP TREATMENT - 01/25/18 0001      General Information   Behavior/Cognition  Alert;Cooperative;Pleasant mood    HPI  Ronald Haney  is a 84 y.o. male with a known history of GERD, hyperlipidemia, hypertension, history of skin cancer presenting to the hospital with complaint being of slurred speech.  Patient was walking with his wife earlier this morning when he developed slurred speech.  Patient came to the emergency room.  CT scan of the head shows no acute stroke.  We are asked to admit the patient for further evaluation.  Due to his symptoms improving neurology recommended no TPA.  Patient states that he was on aspirin but taken off of it 2 weeks ago by his cardiologist.  He stated that he just told him  that he did not need it. Patient denies any trouble with weakness in his extremities.      Treatment Provided   Treatment provided  Cognitive-Linquistic      Cognitive-Linquistic Treatment   Treatment focused on  Dysarthria    Skilled Treatment Patient read multisyllabic initial /z/ words aloud with 90% accuracy (38/42) independently, (self correct x2).   Patient read initial /z/ and /sl/ sentences with 91% accuracy (29/32) independently with self-corrections x2.  Patient used appropriate articulatory accuracy and speech rate when reading paragraphs aloud with 100% accuracy, noted s/c x3.  Patient used compensatory strategies of diaphragmatic breathing and pacing to improve breath support for speech with 90% accuracy given intermittent verbal cues. Patient largely independent, noted s/c x2.  Patient used appropriate articulatory accuracy and speech rate in a five minute conversational sample independently, noted s/c x3.       Assessment / Recommendations / Plan   Plan  Continue with current plan of care       SLP Education - 01/25/18 1150    Education Details  Pacing, overarticulation, increased vocal intensity    Person(s) Educated  Patient    Methods  Verbal cues    Comprehension  Returned demonstration;Verbalized understanding         SLP Long Term Goals - 01/25/18 1153      SLP LONG TERM GOAL #1   Title  Patient will produce multisyllabic words with 90% accuracy given verbal and visual cues.    Baseline  60% accuracy    Time  8    Period  Weeks    Status  Achieved 01/25/2018      SLP LONG TERM GOAL #2   Title  Patient will use appropriate articulatory accuracy and speech rate when reading paragraphs with 90% accuracy and min verbal and visual cues.    Baseline  50% accuracy    Time  8    Period  Weeks    Status  Achieved      SLP LONG TERM GOAL #3   Title  Patient will use pacing and diaphragmatic breathing to improve breath support for speech with 80% accuracy  given min verbal and visual cues.    Baseline  20%    Time  8    Period  Weeks    Status  Achieved      SLP LONG TERM GOAL #4   Title  Patient will use appropriate articulatory accuracy and speech rate in a 5 minute converstational speech sample with 90% accuracy and min verbal and visual cues.    Baseline  40%    Time  8    Period  Weeks    Status  Achieved       Plan - 01/25/18 1151    Clinical Impression Statement  Patient demonstrated consistent use of compensatory strategies of overarticulation, pacing, and increased vocal intensity throughout treatment from multisyllabic word level thru conversational speech. Improved consistent articulatory accuracy and independence producing target sounds of initial /z/ and /s/ blends in words and sentences. Overall speech intelligibility is judged to be 95% at the conversational level. Patient easily self-corrects any errors to repair communication breakdown. Patient has met all goals and will be discharged from speech language therapy treatment today.     Speech Therapy Frequency  Other (comment) d/c patient today, goals met    Duration  Other (comment) d/c patient today, goals met    Treatment/Interventions  Patient/family education;SLP instruction and feedback;Compensatory strategies    Potential to Achieve Goals  Good    Potential Considerations  Ability to learn/carryover information;Family/community support;Cooperation/participation level    SLP Home Exercise Plan  Continue practice of overarticulation, pacing and increased vocal intensity in regular conversational speech    Consulted and Agree with Plan of Care  Patient       Patient will benefit from skilled therapeutic intervention in order to improve the following deficits and impairments:   Dysarthria and anarthria    Problem List Patient Active Problem List   Diagnosis Date Noted  . CVA (cerebral vascular accident) (HCC) 12/23/2017    Litha Lamartina, MA, CCC-SLP 01/25/2018,  12:00 PM  [facility] MAIN [facility] SERVICES [address] [place], [place], [address] Phone: [phone]   Fax:  [phone]   Name: LORANZA LASKER MRN: [id number] Date of Birth: [birth year]

## 2018-02-01 ENCOUNTER — Encounter: Payer: Medicare Other | Admitting: Speech Pathology

## 2018-02-08 ENCOUNTER — Encounter: Payer: Medicare Other | Admitting: Speech Pathology

## 2018-02-12 ENCOUNTER — Encounter: Payer: Medicare Other | Admitting: Speech Pathology

## 2018-02-15 ENCOUNTER — Encounter: Payer: Medicare Other | Admitting: Speech Pathology

## 2018-02-19 ENCOUNTER — Ambulatory Visit: Payer: Medicare Other | Admitting: Speech Pathology

## 2018-02-22 ENCOUNTER — Encounter: Payer: Medicare Other | Admitting: Speech Pathology

## 2018-03-01 ENCOUNTER — Ambulatory Visit: Payer: Medicare Other | Admitting: Speech Pathology

## 2018-04-30 ENCOUNTER — Inpatient Hospital Stay
Admission: EM | Admit: 2018-04-30 | Discharge: 2018-05-03 | DRG: 063 | Disposition: A | Discharge status: Home / Self Care | Payer: Medicare Other | Attending: Internal Medicine | Admitting: Internal Medicine

## 2018-04-30 ENCOUNTER — Emergency Department: Payer: Medicare Other

## 2018-04-30 ENCOUNTER — Other Ambulatory Visit: Payer: Self-pay

## 2018-04-30 DIAGNOSIS — I10 Essential (primary) hypertension: Secondary | ICD-10-CM | POA: Diagnosis present

## 2018-04-30 DIAGNOSIS — R29702 NIHSS score 2: Secondary | ICD-10-CM | POA: Diagnosis present

## 2018-04-30 DIAGNOSIS — H510 Palsy (spasm) of conjugate gaze: Secondary | ICD-10-CM | POA: Diagnosis present

## 2018-04-30 DIAGNOSIS — I639 Cerebral infarction, unspecified: Secondary | ICD-10-CM | POA: Diagnosis not present

## 2018-04-30 DIAGNOSIS — H532 Diplopia: Secondary | ICD-10-CM | POA: Diagnosis present

## 2018-04-30 DIAGNOSIS — E785 Hyperlipidemia, unspecified: Secondary | ICD-10-CM | POA: Diagnosis present

## 2018-04-30 DIAGNOSIS — H02401 Unspecified ptosis of right eyelid: Secondary | ICD-10-CM | POA: Diagnosis present

## 2018-04-30 DIAGNOSIS — Z85828 Personal history of other malignant neoplasm of skin: Secondary | ICD-10-CM

## 2018-04-30 DIAGNOSIS — Z8673 Personal history of transient ischemic attack (TIA), and cerebral infarction without residual deficits: Secondary | ICD-10-CM

## 2018-04-30 DIAGNOSIS — K219 Gastro-esophageal reflux disease without esophagitis: Secondary | ICD-10-CM | POA: Diagnosis present

## 2018-04-30 DIAGNOSIS — H539 Unspecified visual disturbance: Secondary | ICD-10-CM

## 2018-04-30 HISTORY — DX: Cerebral infarction, unspecified: I63.9

## 2018-04-30 LAB — CBC WITH DIFFERENTIAL/PLATELET
ABS IMMATURE GRANULOCYTES: 0.03 10*3/uL (ref 0.00–0.07)
Basophils Absolute: 0 10*3/uL (ref 0.0–0.1)
Basophils Relative: 0 %
Eosinophils Absolute: 0.1 10*3/uL (ref 0.0–0.5)
Eosinophils Relative: 1 %
HCT: 44.2 % (ref 39.0–52.0)
HEMOGLOBIN: 14.9 g/dL (ref 13.0–17.0)
Immature Granulocytes: 0 %
LYMPHS PCT: 38 %
Lymphs Abs: 2.9 10*3/uL (ref 0.7–4.0)
MCH: 32.5 pg (ref 26.0–34.0)
MCHC: 33.7 g/dL (ref 30.0–36.0)
MCV: 96.5 fL (ref 80.0–100.0)
MONOS PCT: 6 %
Monocytes Absolute: 0.4 10*3/uL (ref 0.1–1.0)
NEUTROS ABS: 4 10*3/uL (ref 1.7–7.7)
Neutrophils Relative %: 55 %
PLATELETS: 142 10*3/uL — AB (ref 150–400)
RBC: 4.58 MIL/uL (ref 4.22–5.81)
RDW: 12.6 % (ref 11.5–15.5)
WBC: 7.4 10*3/uL (ref 4.0–10.5)
nRBC: 0 % (ref 0.0–0.2)

## 2018-04-30 LAB — COMPREHENSIVE METABOLIC PANEL
ALT: 19 U/L (ref 0–44)
AST: 23 U/L (ref 15–41)
Albumin: 4.1 g/dL (ref 3.5–5.0)
Alkaline Phosphatase: 73 U/L (ref 38–126)
Anion gap: 8 (ref 5–15)
BUN: 32 mg/dL — AB (ref 8–23)
CHLORIDE: 101 mmol/L (ref 98–111)
CO2: 25 mmol/L (ref 22–32)
CREATININE: 1.14 mg/dL (ref 0.61–1.24)
Calcium: 9 mg/dL (ref 8.9–10.3)
GFR calc Af Amer: >60 mL/min (ref >60)
GFR calc non Af Amer: 57 mL/min — ABNORMAL LOW (ref >60)
Glucose, Bld: 133 mg/dL — ABNORMAL HIGH (ref 70–99)
Potassium: 3.6 mmol/L (ref 3.5–5.1)
SODIUM: 134 mmol/L — AB (ref 135–145)
Total Bilirubin: 0.8 mg/dL (ref 0.3–1.2)
Total Protein: 7.1 g/dL (ref 6.5–8.1)

## 2018-04-30 LAB — TROPONIN I: Troponin I: <0.03 ng/mL (ref <0.03)

## 2018-04-30 LAB — PROTIME-INR
INR: 0.98
Prothrombin Time: 12.9 seconds (ref 11.4–15.2)

## 2018-04-30 LAB — ETHANOL

## 2018-04-30 LAB — GLUCOSE, CAPILLARY: Glucose-Capillary: 112 mg/dL — ABNORMAL HIGH (ref 70–99)

## 2018-04-30 LAB — APTT: aPTT: 26 seconds (ref 24–36)

## 2018-04-30 MED ORDER — SODIUM CHLORIDE 0.9 % IV SOLN
50.0000 mL | Freq: Once | INTRAVENOUS | Status: AC
  Administered 2018-04-30: 50 mL via INTRAVENOUS

## 2018-04-30 MED ORDER — ACETAMINOPHEN 500 MG PO TABS
1000.0000 mg | ORAL_TABLET | Freq: Once | ORAL | Status: AC
  Administered 2018-05-01: 1000 mg via ORAL
  Filled 2018-04-30: qty 2

## 2018-04-30 MED ORDER — ALTEPLASE 100 MG IV SOLR
INTRAVENOUS | Status: AC
  Administered 2018-04-30: 71.5 mg via INTRAVENOUS
  Filled 2018-04-30: qty 100

## 2018-04-30 MED ORDER — ALTEPLASE (STROKE) FULL DOSE INFUSION
0.9000 mg/kg | Freq: Once | INTRAVENOUS | Status: AC
  Administered 2018-04-30: 71.5 mg via INTRAVENOUS
  Filled 2018-04-30: qty 100

## 2018-04-30 MED ORDER — LABETALOL HCL 5 MG/ML IV SOLN
INTRAVENOUS | Status: AC
  Filled 2018-04-30: qty 4

## 2018-04-30 NOTE — ED Notes (Signed)
Pt transported to CT ?

## 2018-04-30 NOTE — ED Notes (Addendum)
Neuro MD performing neuro exam with my assistance at this time. Pt advised of TPA and benefits and risks associated with it. Family at bedside asking questions

## 2018-04-30 NOTE — Progress Notes (Signed)
Provided Haney and family emotional and spiritual support during code stroke. Ronald is well supported by his family    04/30/18 2100  Clinical Encounter Type  Visited With Patient and family together  Visit Type Initial  Referral From Nurse

## 2018-04-30 NOTE — ED Notes (Signed)
Pt states he feels better and his vision has improved.

## 2018-04-30 NOTE — Progress Notes (Signed)
CODE STROKE- PHARMACY COMMUNICATION   Time CODE STROKE called/page received:1944  Time response to CODE STROKE was made (in person or via phone): 1954  Time Stroke Kit retrieved from [place] (only if needed):2042  Name of Provider/Nurse contacted:Samantha [phone]   Past Medical History:  Diagnosis Date  . GERD (gastroesophageal reflux disease)   . Hyperlipemia   . Hypertension   . Skin cancer   . Stroke [facility])    Prior to Admission medications   Medication Sig Start Date End Date Taking? Authorizing Provider  aspirin 81 MG chewable tablet Chew 1 tablet by mouth daily.    [provider]  atenolol (TENORMIN) 25 MG tablet Take 1 tablet by mouth daily. 06/15/17 06/15/18  [provider]  atorvastatin (LIPITOR) 40 MG tablet Take 1 tablet (40 mg total) by mouth daily. 12/24/17 12/24/18  Dustin Flock, MD  fluticasone (FLONASE) 50 MCG/ACT nasal spray Place 2 sprays into both nostrils daily. 10/03/17   [provider]  gemfibrozil (LOPID) 600 MG tablet Take 1 tablet by mouth 2 (two) times daily. 09/23/17   [provider]  hydrochlorothiazide (HYDRODIURIL) 25 MG tablet Take 12.5 mg by mouth daily. 09/23/17   [provider]  lisinopril (PRINIVIL,ZESTRIL) 30 MG tablet Take 30 mg by mouth daily. 11/20/17   [provider]  omeprazole (PRILOSEC) 20 MG capsule Take 20 mg by mouth daily. 11/20/17   [provider]    Lu Duffel ,PharmD Clinical Pharmacist  04/30/2018  7:47 PM

## 2018-04-30 NOTE — ED Notes (Signed)
Pt arrived to CT. 

## 2018-04-30 NOTE — Consult Note (Signed)
[facility] Tele[facility]urology Consult Services   Date of Service:   04/30/2018 20:04:50  Impression:     .  RO Acute Ischemic Stroke  Comments: 84 year old male with binocular diplopia and vertigo with mild ataxia. Concern for possible posterior circulation infarct.  Mechanism of Stroke: Possible Thromboembolic Small Vessel Disease  Metrics: Last Known Well: 04/30/2018 18:30:00 [facility] Notification Time: 04/30/2018 20:03:52 Arrival Time: 04/30/2018 19:26:00 Stamp Time: 04/30/2018 20:04:50 Time First Login Attempt: 04/30/2018 20:10:00 Video Start Time: 04/30/2018 20:10:00  Symptoms: Double vision NIHSS Start Assessment Time: 04/30/2018 20:15:00 tPA Verbal Order Time: 04/30/2018 20:25:09 Patient is a candidate for tPA. tPA CPOE Order Time: 04/30/2018 20:29:20 Needle Time: 04/30/2018 20:39:56 Weight Noted by Staff: 79.4 kg Video End Time: 04/30/2018 20:45:18 Reason for tPA Delay: Activation Delay tPA Delay Notes: Delay in stroke alert  CT head was reviewed.  Advanced imaging was not obtained as the presentation was not suggestive of Large Vessel Occlusive Disease.   Radiologist was not called back for review of advanced imaging because CTA not done ER Physician notified of the decision on thrombolytics management on 04/30/2018 20:46:24  Verbal Consent to tPA: I have explained to the Patient and Family the nature of the patient's condition, the use of tPA fibrinolytic agent, and the benefits to be reasonably expected compared with alternative approaches. I have discussed the likelihood of major risks or complications of this procedure including (if applicable) but not limited to loss of limb function, brain damage, paralysis, hemorrhage, infection, complications from transfusion of blood components, drug reactions, blood clots and loss of life. I have also indicated that with any procedure there is always the possibility of an unexpected  complication. All questions were answered and Patient and Family express understanding of the treatment plan and consent to the treatment.  Our recommendations are outlined below.  Recommendations: IV tPA recommended.  tPA bolus given Without Complication.   IV tPA Total Dose - 71.5 mg IV tPA Bolus Dose - 7.1 mg IV tPA Infusion Dose - 64.3 mg  Routine post tPA monitoring including neuro checks and blood pressure control during/after treatment Monitor blood pressure Check blood pressure and NIHSS every 15 min for 2 h, then every 30 min for 6 h, and finally every hour for 16 h.  Manage Blood Pressure per post tPA protocol.      .  Admission to ICU     .  CT brain 24 hours post tPA     .  NPO until swallowing screen performed and passed     .  No antiplatelet agents or anticoagulants (including heparin for DVT prophylaxis) in first 24 hours     .  No Foley catheter, nasogastric tube, arterial catheter or central venous catheter for 24 hr, unless absolutely necessary     .  Telemetry     .  Bedside swallow evaluation     .  HOB less than 30 degrees     .  Euglycemia     .  Avoid hyperthermia, PRN acetaminophen     .  DVT prophylaxis     .  Inpatient Neurology Consultation     .  Stroke evaluation as per inpatient neurology recommendations  Discussed with ED physician    ------------------------------------------------------------------------------  History of Present Illness: Patient is a 84 years old Male.  Patient was brought by EMS for symptoms of Double vision  84 year old male with a history of HTN and stroke in June with mild residual aphasia  who presents to the ED today because of sudden onset of vertigo with nausea and double vision. On exam patient had mild ataxia on the right hand and he was unable to adduct his right eye with left gaze and had binocular diplopia with leftward gaze.  CT head was reviewed.  Last seen normal was within 4.5 hours. There is no  history of hemorrhagic complications or intracranial hemorrhage. There is no history of Recent Anticoagulants. There is no history of recent major surgery. There is no history of recent stroke.  Examination: BP(161/69), 1A: Level of Consciousness - Alert; keenly responsive + 0 1B: Ask Month and Age - Both Questions Right + 0 1C: Blink Eyes & Squeeze Hands - Performs Both Tasks + 0 2: Test Horizontal Extraocular Movements - Partial Gaze Palsy: Can Be Overcome + 1 3: Test Visual Fields - No Visual Loss + 0 4: Test Facial Palsy (Use Grimace if Obtunded) - Normal symmetry + 0 5A: Test Left Arm Motor Drift - No Drift for 10 Seconds + 0 5B: Test Right Arm Motor Drift - No Drift for 10 Seconds + 0 6A: Test Left Leg Motor Drift - No Drift for 5 Seconds + 0 6B: Test Right Leg Motor Drift - No Drift for 5 Seconds + 0 7: Test Limb Ataxia (FNF/Heel-Shin) - Ataxia in 1 Limb + 1 8: Test Sensation - Normal; No sensory loss + 0 9: Test Language/Aphasia - Normal; No aphasia + 0 10: Test Dysarthria - Normal + 0 11: Test Extinction/Inattention - No abnormality + 0  NIHSS Score: 2  Patient was informed the Neurology Consult would happen via TeleHealth consult by way of interactive audio and video telecommunications and consented to receiving care in this manner.  Due to the immediate potential for life-threatening deterioration due to underlying acute neurologic illness, I spent 35 minutes providing critical care. This time includes time for face to face visit via telemedicine, review of medical records, imaging studies and discussion of findings with providers, the patient and/or family.   Dr Tsosie Billing   [facility] [phone] [facility] Tele[facility]rology Consult Services   Date of Service:   04/30/2018 20:04:50  Impression:     .  RO Acute Ischemic Stroke  Comments: 84 year old male with binocular diplopia and vertigo with mild ataxia. Concern for possible  posterior circulation infarct.  Mechanism of Stroke: Possible Thromboembolic Small Vessel Disease  Metrics: Last Known Well: 04/30/2018 18:30:00 [facility] Notification Time: 04/30/2018 20:03:52 Arrival Time: 04/30/2018 19:26:00 Stamp Time: 04/30/2018 20:04:50 Time First Login Attempt: 04/30/2018 20:10:00 Video Start Time: 04/30/2018 20:10:00  Symptoms: Double vision NIHSS Start Assessment Time: 04/30/2018 20:15:00 tPA Verbal Order Time: 04/30/2018 20:25:09 Patient is a candidate for tPA. tPA CPOE Order Time: 04/30/2018 20:29:20 Needle Time: 04/30/2018 20:39:56 Weight Noted by Staff: 79.4 kg Video End Time: 04/30/2018 20:45:18 Reason for tPA Delay: Activation Delay tPA Delay Notes: Delay in stroke alert  CT head was reviewed.  Advanced imaging was not obtained as the presentation was not suggestive of Large Vessel Occlusive Disease.   Radiologist was not called back for review of advanced imaging because CTA not done ER Physician notified of the decision on thrombolytics management on 04/30/2018 20:46:24  Verbal Consent to tPA: I have explained to the Patient and Family the nature of the patient's condition, the use of tPA fibrinolytic agent, and the benefits to be reasonably expected compared with alternative approaches. I have discussed the likelihood of major risks or complications of this procedure  including (if applicable) but not limited to loss of limb function, brain damage, paralysis, hemorrhage, infection, complications from transfusion of blood components, drug reactions, blood clots and loss of life. I have also indicated that with any procedure there is always the possibility of an unexpected complication. All questions were answered and Patient and Family express understanding of the treatment plan and consent to the treatment.  Our recommendations are outlined below.  Recommendations: IV tPA recommended.  tPA bolus given Without Complication.   IV tPA  Total Dose - 71.5 mg IV tPA Bolus Dose - 7.1 mg IV tPA Infusion Dose - 64.3 mg  Routine post tPA monitoring including neuro checks and blood pressure control during/after treatment Monitor blood pressure Check blood pressure and NIHSS every 15 min for 2 h, then every 30 min for 6 h, and finally every hour for 16 h.  Manage Blood Pressure per post tPA protocol.      .  Admission to ICU     .  CT brain 24 hours post tPA     .  NPO until swallowing screen performed and passed     .  No antiplatelet agents or anticoagulants (including heparin for DVT prophylaxis) in first 24 hours     .  No Foley catheter, nasogastric tube, arterial catheter or central venous catheter for 24 hr, unless absolutely necessary     .  Telemetry     .  Bedside swallow evaluation     .  HOB less than 30 degrees     .  Euglycemia     .  Avoid hyperthermia, PRN acetaminophen     .  DVT prophylaxis     .  Inpatient Neurology Consultation     .  Stroke evaluation as per inpatient neurology recommendations  Discussed with ED physician    ------------------------------------------------------------------------------  History of Present Illness: Patient is a 84 years old Male.  Patient was brought by EMS for symptoms of Double vision  84 year old male with a history of HTN and stroke in June with mild residual aphasia who presents to the ED today because of sudden onset of vertigo with nausea and double vision. On exam patient had mild ataxia on the right hand and he was unable to adduct his right eye with left gaze and had binocular diplopia with leftward gaze.  CT head was reviewed.  Last seen normal was within 4.5 hours. There is no history of hemorrhagic complications or intracranial hemorrhage. There is no history of Recent Anticoagulants. There is no history of recent major surgery. There is no history of recent stroke.  Examination: BP(161/69), 1A: Level of Consciousness - Alert; keenly responsive +  0 1B: Ask Month and Age - Both Questions Right + 0 1C: Blink Eyes & Squeeze Hands - Performs Both Tasks + 0 2: Test Horizontal Extraocular Movements - Partial Gaze Palsy: Can Be Overcome + 1 3: Test Visual Fields - No Visual Loss + 0 4: Test Facial Palsy (Use Grimace if Obtunded) - Normal symmetry + 0 5A: Test Left Arm Motor Drift - No Drift for 10 Seconds + 0 5B: Test Right Arm Motor Drift - No Drift for 10 Seconds + 0 6A: Test Left Leg Motor Drift - No Drift for 5 Seconds + 0 6B: Test Right Leg Motor Drift - No Drift for 5 Seconds + 0 7: Test Limb Ataxia (FNF/Heel-Shin) - Ataxia in 1 Limb + 1 8: Test Sensation - Normal; No sensory loss +  0 9: Test Language/Aphasia - Normal; No aphasia + 0 10: Test Dysarthria - Normal + 0 11: Test Extinction/Inattention - No abnormality + 0  NIHSS Score: 2  Patient was informed the Neurology Consult would happen via TeleHealth consult by way of interactive audio and video telecommunications and consented to receiving care in this manner.  Due to the immediate potential for life-threatening deterioration due to underlying acute neurologic illness, I spent 35 minutes providing critical care. This time includes time for face to face visit via telemedicine, review of medical records, imaging studies and discussion of findings with providers, the patient and/or family.   Dr Tsosie Billing   [facility] [phone]

## 2018-04-30 NOTE — ED Notes (Signed)
Pt resting comfortably at this time. Family at bedside

## 2018-04-30 NOTE — ED Notes (Signed)
Pt attempted to use urinal unsuccessfully. Will try again later.

## 2018-04-30 NOTE — ED Notes (Signed)
Report given to Andrea RN

## 2018-04-30 NOTE — ED Triage Notes (Addendum)
Pt comes via [facility] from home with c/o dizziness and blurred vision. Pt was in shower and became dizzy. Pt vomited X2 and was given 4 zofran by EMS. VSS.   Per EMS pt was negative on stroke screen. Pt has blurred vision that has continued in the left eye.  Pt states he is seeing double in his left eye. Pt's right eyelid looks lazy.  Pt has hx of stroke in June with some stuttering in speech. Pt is A&OX4.

## 2018-04-30 NOTE — ED Provider Notes (Signed)
[facility] Emergency Department Provider Note   ____________________________________________   First MD Initiated Contact with Patient 04/30/18 1932     (approximate)  I have reviewed the triage vital signs and the nursing notes.   HISTORY  Chief Complaint Dizziness    HPI THERON CUMBIE is a 84 y.o. male who reports about an hour ago he was taking a shower when he got lightheaded and began having double vision.  Patient's double vision is better when he looks to the right worse when he looks to the left.  He is never had this before.  He has no chest pain tightness shortness of breath slurry speech ataxia or anything else.   Past Medical History:  Diagnosis Date  . GERD (gastroesophageal reflux disease)   . Hyperlipemia   . Hypertension   . Skin cancer   . Stroke [facility])     Patient Active Problem List   Diagnosis Date Noted  . CVA (cerebral vascular accident) ([place]) 12/23/2017    Past Surgical History:  Procedure Laterality Date  . ANKLE SURGERY    . ccy    . TRANSURETHRAL RESECTION OF PROSTATE      Prior to Admission medications   Medication Sig Start Date End Date Taking? Authorizing Provider  aspirin 81 MG chewable tablet Chew 81 mg by mouth daily.    Yes [provider]  atenolol (TENORMIN) 25 MG tablet Take 25 mg by mouth daily.    Yes [provider]  atorvastatin (LIPITOR) 40 MG tablet Take 1 tablet (40 mg total) by mouth daily. 12/24/17 12/24/18 Yes Dustin Flock, MD  fluticasone (FLONASE) 50 MCG/ACT nasal spray Place 2 sprays into both nostrils daily. 10/03/17  Yes [provider]  gemfibrozil (LOPID) 600 MG tablet Take 600 mg by mouth 2 (two) times daily.    Yes [provider]  hydrochlorothiazide (HYDRODIURIL) 25 MG tablet Take 25 mg by mouth daily.    Yes [provider]  lisinopril (PRINIVIL,ZESTRIL) 30 MG tablet Take 30 mg by mouth daily. 11/20/17  Yes [provider]    omeprazole (PRILOSEC) 20 MG capsule Take 20 mg by mouth daily. 11/20/17  Yes [provider]    Allergies Patient has no known allergies.  Family History  Problem Relation Age of Onset  . CAD Father     Social History Social History   Tobacco Use  . Smoking status: Never Smoker  . Smokeless tobacco: Never Used  Substance Use Topics  . Alcohol use: Not Currently  . Drug use: Not Currently    Review of Systems  Constitutional: No fever/chills Eyes: See HPI ENT: No sore throat. Cardiovascular: Denies chest pain. Respiratory: Denies shortness of breath. Gastrointestinal: No abdominal pain.  No nausea, no vomiting.  No diarrhea.  No constipation. Genitourinary: Negative for dysuria. Musculoskeletal: Negative for back pain. Skin: Negative for rash. Neurological: Negative for headaches, focal weakness ____________________________________________   PHYSICAL EXAM:  VITAL SIGNS: ED Triage Vitals  Enc Vitals Group     BP 04/30/18 1933 (!) 161/76     Pulse Rate 04/30/18 1933 60     Resp 04/30/18 1933 18     Temp 04/30/18 1933 (!) 97.3 F (36.3 C)     Temp Source 04/30/18 1933 Oral     SpO2 04/30/18 1933 96 %     Weight 04/30/18 1932 175 lb (79.4 kg)     Height 04/30/18 1932 5\' 10"  (1.778 m)     Head Circumference --  Peak Flow --      Pain Score 04/30/18 1931 0     Pain Loc --      Pain Edu? --      Excl. in [place]? --     Constitutional: Alert and oriented. Well appearing and in no acute distress. Eyes: Conjunctivae are normal. PER.  Patient's right I will not cross the midline to the left. Head: Atraumatic. Nose: No congestion/rhinnorhea. Mouth/Throat: Mucous membranes are moist.  Oropharynx non-erythematous. Neck: No stridor. Cardiovascular: Normal rate, regular rhythm. Grossly normal heart sounds.  Good peripheral circulation. Respiratory: Normal respiratory effort.  No retractions. Lungs CTAB. Gastrointestinal: Soft and nontender. No distention.  No abdominal bruits. No CVA tenderness. Musculoskeletal: No lower extremity tenderness nor edema.  No joint effusions. Neurologic:  Normal speech and language.  Cranial nerves II through XII are intact except for the right eye not crossing the midline to look left.  Cerebellar finger-nose heel-to-shin and rapid alternating movements and hands are normal motor strength is 5/5 throughout patient does not report any numbness Skin:  Skin is warm, dry and intact. No rash noted. Psychiatric: Mood and affect are normal. Speech and behavior are normal.  ____________________________________________   LABS (all labs ordered are listed, but only abnormal results are displayed)  Labs Reviewed  COMPREHENSIVE METABOLIC PANEL - Abnormal; Notable for the following components:      Result Value   Sodium 134 (*)    Glucose, Bld 133 (*)    BUN 32 (*)    GFR calc non Af Amer 57 (*)    All other components within normal limits  CBC WITH DIFFERENTIAL/PLATELET - Abnormal; Notable for the following components:   Platelets 142 (*)    All other components within normal limits  GLUCOSE, CAPILLARY - Abnormal; Notable for the following components:   Glucose-Capillary 112 (*)    All other components within normal limits  TROPONIN I  ETHANOL  PROTIME-INR  APTT  URINALYSIS, COMPLETE (UACMP) WITH MICROSCOPIC  URINE DRUG SCREEN, QUALITATIVE ([facility] ONLY)  URINALYSIS, ROUTINE W REFLEX MICROSCOPIC   ____________________________________________  EKG  EKG read and interpreted by me shows normal sinus rhythm at a rate of 61 left axis at least by my reading the computer thinks it is normal axis nonsignificant ST elevation inferiorly again patient has no chest pain or tightness ____________________________________________  RADIOLOGY  ED MD interpretation: CT read as negative by radiology  Official radiology report(s) Dg Chest Portable 1 View  Result Date: 04/30/2018 CLINICAL DATA:  Dizziness. EXAM: PORTABLE  CHEST 1 VIEW COMPARISON:  09/30/2015 FINDINGS: The cardiomediastinal silhouette is within normal limits. The lungs are less well inflated than on the prior study with minimal atelectasis in the lung bases. No edema, pleural effusion, or pneumothorax is identified. No acute osseous abnormality is seen. IMPRESSION: No active disease. Electronically Signed   By: Logan Bores M.D.   On: 04/30/2018 20:01   Ct Head Code Stroke Wo Contrast  Result Date: 04/30/2018 CLINICAL DATA:  Code stroke. 84 y/o M; dizziness and blurred vision for 1 hour. History of stroke. EXAM: CT HEAD WITHOUT CONTRAST TECHNIQUE: Contiguous axial images were obtained from the base of the skull through the vertex without intravenous contrast. COMPARISON:  12/23/2017 CT head and MRI head. FINDINGS: Brain: No evidence of acute infarction, hemorrhage, hydrocephalus, extra-axial collection or mass lesion/mass effect. Small chronic lacunar infarcts are present within the bilateral thalami. There are mild chronic microvascular ischemic changes of the brain and volume loss. Very small chronic cortical  infarction within the left lateral frontal lobe. Vascular: Calcific atherosclerosis of carotid siphons. No hyperdense vessel identified. Skull: Normal. Negative for fracture or focal lesion. Sinuses/Orbits: No acute finding. Other: None. ASPECTS [facility] Stroke Program Early CT Score) - Ganglionic level infarction (caudate, lentiform nuclei, internal capsule, insula, M1-M3 cortex): 7 - Supraganglionic infarction (M4-M6 cortex): 3 Total score (0-10 with 10 being normal): 10 IMPRESSION: 1. No acute intracranial abnormality identified.  ASPECTS is 10. 2. Stable chronic microvascular ischemic changes and volume loss of the brain. Stable small chronic infarcts within the thalami and left lateral frontal lobe. These results were called by telephone at the time of interpretation on 04/30/2018 at 8:10 pm to Dr. Conni Slipper , who verbally acknowledged these results.  Electronically Signed   By: Kristine Garbe M.D.   On: 04/30/2018 20:10    ____________________________________________   PROCEDURES  Procedure(s) performed:  Procedures  Critical Care performed:   ____________________________________________   INITIAL IMPRESSION / ASSESSMENT AND PLAN / ED COURSE           ____________________________________________   FINAL CLINICAL IMPRESSION(S) / ED DIAGNOSES  Final diagnoses:  Cerebrovascular accident (CVA), unspecified mechanism [facility])     ED Discharge Orders    None     Tele-neurology gives TPA for this patient and I agree.  Note:  This document was prepared using Dragon voice recognition software and may include unintentional dictation errors.    Nena Polio, MD 04/30/18 2051

## 2018-04-30 NOTE — ED Notes (Addendum)
Pt given verbal permission for TPA infusion to be started at this time to MD Vajapey and myself. Will obtain kit and begin ASAP

## 2018-04-30 NOTE — ED Notes (Addendum)
Pt states he still feels little dizzy. Pt states vision is better. Right eye lid still appears lazy. Family states they believe the eyelid is worse than earlier. Pt denies any pain or headaches. MD Malinda at bedside

## 2018-04-30 NOTE — ED Notes (Signed)
RN Earnest Bailey advised of pt's last know well of 1830pm

## 2018-04-30 NOTE — ED Notes (Signed)
Patient transported to CT 

## 2018-04-30 NOTE — ED Notes (Addendum)
Called neuro for code stroke, [facility] informed of pt's status and given all required information.

## 2018-04-30 NOTE — ED Notes (Signed)
Pt back from CT

## 2018-04-30 NOTE — ED Notes (Signed)
MD Tsosie Billing present on screen at this time. Holly RN informing her of pt's past medical hx and present symptoms.

## 2018-05-01 ENCOUNTER — Inpatient Hospital Stay: Payer: Medicare Other

## 2018-05-01 ENCOUNTER — Inpatient Hospital Stay
Admit: 2018-05-01 | Discharge: 2018-05-01 | Disposition: A | Discharge status: Home / Self Care | Payer: Medicare Other | Attending: Nurse Practitioner | Admitting: Nurse Practitioner

## 2018-05-01 ENCOUNTER — Encounter: Payer: Self-pay | Admitting: *Deleted

## 2018-05-01 DIAGNOSIS — R29702 NIHSS score 2: Secondary | ICD-10-CM | POA: Diagnosis present

## 2018-05-01 DIAGNOSIS — H539 Unspecified visual disturbance: Secondary | ICD-10-CM

## 2018-05-01 DIAGNOSIS — K219 Gastro-esophageal reflux disease without esophagitis: Secondary | ICD-10-CM | POA: Diagnosis present

## 2018-05-01 DIAGNOSIS — E785 Hyperlipidemia, unspecified: Secondary | ICD-10-CM | POA: Diagnosis present

## 2018-05-01 DIAGNOSIS — I10 Essential (primary) hypertension: Secondary | ICD-10-CM | POA: Diagnosis present

## 2018-05-01 DIAGNOSIS — Z8673 Personal history of transient ischemic attack (TIA), and cerebral infarction without residual deficits: Secondary | ICD-10-CM | POA: Diagnosis not present

## 2018-05-01 DIAGNOSIS — H532 Diplopia: Secondary | ICD-10-CM | POA: Diagnosis present

## 2018-05-01 DIAGNOSIS — H510 Palsy (spasm) of conjugate gaze: Secondary | ICD-10-CM | POA: Diagnosis present

## 2018-05-01 DIAGNOSIS — I639 Cerebral infarction, unspecified: Secondary | ICD-10-CM | POA: Diagnosis present

## 2018-05-01 DIAGNOSIS — H02401 Unspecified ptosis of right eyelid: Secondary | ICD-10-CM | POA: Diagnosis present

## 2018-05-01 DIAGNOSIS — Z85828 Personal history of other malignant neoplasm of skin: Secondary | ICD-10-CM | POA: Diagnosis not present

## 2018-05-01 LAB — URINALYSIS, COMPLETE (UACMP) WITH MICROSCOPIC
BACTERIA UA: NONE SEEN
Bilirubin Urine: NEGATIVE
Glucose, UA: NEGATIVE mg/dL
Hgb urine dipstick: NEGATIVE
Ketones, ur: 5 mg/dL — AB
LEUKOCYTES UA: NEGATIVE
Nitrite: NEGATIVE
PH: 5 (ref 5.0–8.0)
PROTEIN: NEGATIVE mg/dL
SPECIFIC GRAVITY, URINE: 1.02 (ref 1.005–1.030)
SQUAMOUS EPITHELIAL / LPF: NONE SEEN (ref 0–5)

## 2018-05-01 LAB — LIPID PANEL
CHOL/HDL RATIO: 2.9 ratio
CHOLESTEROL: 101 mg/dL (ref 0–200)
HDL: 35 mg/dL — AB (ref >40)
LDL Cholesterol: 58 mg/dL (ref 0–99)
Triglycerides: 42 mg/dL (ref <150)
VLDL: 8 mg/dL (ref 0–40)

## 2018-05-01 LAB — CBC WITH DIFFERENTIAL/PLATELET
Abs Immature Granulocytes: 0.03 10*3/uL (ref 0.00–0.07)
Basophils Absolute: 0 10*3/uL (ref 0.0–0.1)
Basophils Relative: 0 %
EOS PCT: 0 %
Eosinophils Absolute: 0 10*3/uL (ref 0.0–0.5)
HEMATOCRIT: 49.3 % (ref 39.0–52.0)
HEMOGLOBIN: 17 g/dL (ref 13.0–17.0)
Immature Granulocytes: 0 %
LYMPHS PCT: 29 %
Lymphs Abs: 2.9 10*3/uL (ref 0.7–4.0)
MCH: 33.1 pg (ref 26.0–34.0)
MCHC: 34.5 g/dL (ref 30.0–36.0)
MCV: 95.9 fL (ref 80.0–100.0)
MONOS PCT: 5 %
Monocytes Absolute: 0.5 10*3/uL (ref 0.1–1.0)
NEUTROS ABS: 6.7 10*3/uL (ref 1.7–7.7)
Neutrophils Relative %: 66 %
Platelets: 162 10*3/uL (ref 150–400)
RBC: 5.14 MIL/uL (ref 4.22–5.81)
RDW: 12.6 % (ref 11.5–15.5)
WBC: 10.2 10*3/uL (ref 4.0–10.5)
nRBC: 0 % (ref 0.0–0.2)

## 2018-05-01 LAB — URINE DRUG SCREEN, QUALITATIVE (ARMC ONLY)
Amphetamines, Ur Screen: NOT DETECTED
BARBITURATES, UR SCREEN: NOT DETECTED
BENZODIAZEPINE, UR SCRN: NOT DETECTED
CANNABINOID 50 NG, UR ~~LOC~~: NOT DETECTED
COCAINE METABOLITE, UR ~~LOC~~: NOT DETECTED
MDMA (Ecstasy)Ur Screen: NOT DETECTED
METHADONE SCREEN, URINE: NOT DETECTED
Opiate, Ur Screen: NOT DETECTED
Phencyclidine (PCP) Ur S: NOT DETECTED
TRICYCLIC, UR SCREEN: NOT DETECTED

## 2018-05-01 LAB — BASIC METABOLIC PANEL
Anion gap: 9 (ref 5–15)
BUN: 31 mg/dL — AB (ref 8–23)
CALCIUM: 8.9 mg/dL (ref 8.9–10.3)
CHLORIDE: 102 mmol/L (ref 98–111)
CO2: 24 mmol/L (ref 22–32)
CREATININE: 1.03 mg/dL (ref 0.61–1.24)
GFR calc Af Amer: >60 mL/min (ref >60)
GLUCOSE: 124 mg/dL — AB (ref 70–99)
POTASSIUM: 3.7 mmol/L (ref 3.5–5.1)
Sodium: 135 mmol/L (ref 135–145)

## 2018-05-01 LAB — HEMOGLOBIN A1C
Hgb A1c MFr Bld: 6 % — ABNORMAL HIGH (ref 4.8–5.6)
MEAN PLASMA GLUCOSE: 125.5 mg/dL

## 2018-05-01 LAB — GLUCOSE, CAPILLARY: Glucose-Capillary: 109 mg/dL — ABNORMAL HIGH (ref 70–99)

## 2018-05-01 LAB — MRSA PCR SCREENING: MRSA by PCR: NEGATIVE

## 2018-05-01 LAB — MAGNESIUM: Magnesium: 1.9 mg/dL (ref 1.7–2.4)

## 2018-05-01 MED ORDER — ACETAMINOPHEN 650 MG RE SUPP: 650.0000 mg | RECTAL | Status: DC | PRN

## 2018-05-01 MED ORDER — LISINOPRIL 5 MG PO TABS: 30.0000 mg | ORAL_TABLET | Freq: Every day | ORAL | Status: DC

## 2018-05-01 MED ORDER — ATORVASTATIN CALCIUM 20 MG PO TABS
40.0000 mg | ORAL_TABLET | Freq: Every day | ORAL | Status: DC
  Administered 2018-05-01 – 2018-05-02 (×2): 40 mg via ORAL
  Filled 2018-05-01 (×2): qty 2

## 2018-05-01 MED ORDER — GADOBUTROL 1 MMOL/ML IV SOLN
7.5000 mL | Freq: Once | INTRAVENOUS | Status: AC | PRN
  Administered 2018-05-01: 7.5 mL via INTRAVENOUS

## 2018-05-01 MED ORDER — STROKE: EARLY STAGES OF RECOVERY BOOK
Freq: Once | Status: AC
  Administered 2018-05-01: 14:00:00
  Filled 2018-05-01: qty 1

## 2018-05-01 MED ORDER — ONDANSETRON HCL 4 MG/2ML IJ SOLN
INTRAMUSCULAR | Status: AC
  Administered 2018-05-01: 4 mg via INTRAVENOUS
  Filled 2018-05-01: qty 2

## 2018-05-01 MED ORDER — ATENOLOL 25 MG PO TABS: 25.0000 mg | ORAL_TABLET | Freq: Every day | ORAL | Status: DC

## 2018-05-01 MED ORDER — ACETAMINOPHEN 325 MG PO TABS: 650.0000 mg | ORAL_TABLET | ORAL | Status: DC | PRN

## 2018-05-01 MED ORDER — PANTOPRAZOLE SODIUM 40 MG IV SOLR: 40.0000 mg | Freq: Every day | INTRAVENOUS | Status: DC

## 2018-05-01 MED ORDER — ACETAMINOPHEN 160 MG/5ML PO SOLN
650.0000 mg | ORAL | Status: DC | PRN
  Filled 2018-05-01: qty 20.3

## 2018-05-01 MED ORDER — ONDANSETRON HCL 4 MG/2ML IJ SOLN
4.0000 mg | Freq: Four times a day (QID) | INTRAMUSCULAR | Status: DC | PRN
  Administered 2018-05-01 – 2018-05-02 (×2): 4 mg via INTRAVENOUS
  Filled 2018-05-01: qty 2

## 2018-05-01 MED ORDER — MAGNESIUM SULFATE IN D5W 1-5 GM/100ML-% IV SOLN
1.0000 g | Freq: Once | INTRAVENOUS | Status: AC
  Administered 2018-05-01: 1 g via INTRAVENOUS
  Filled 2018-05-01: qty 100

## 2018-05-01 MED ORDER — PANTOPRAZOLE SODIUM 40 MG PO TBEC
40.0000 mg | DELAYED_RELEASE_TABLET | Freq: Every day | ORAL | Status: DC
  Administered 2018-05-01 – 2018-05-03 (×3): 40 mg via ORAL
  Filled 2018-05-01 (×3): qty 1

## 2018-05-01 NOTE — Progress Notes (Signed)
eLink Physician-Brief Progress Note Patient Name: Ronald Haney DOB: 1931/06/01 MRN: [id number]   Date of Service  05/01/2018  HPI/Events of Note  84 yo male  presents with dizziness and visual disturbance. Head CT c/w ischemic stroke. Now s/p tPA being admitted to ICU. PCCM being asked to assume care in ICU. PCCM asked to assume care. VSS.   eICU Interventions  Now new orders.      Intervention Category Evaluation Type: New Patient Evaluation  Ryelle Ruvalcaba Eugene 05/01/2018, 3:10 AM

## 2018-05-01 NOTE — Progress Notes (Signed)
OT Cancellation Note  Patient Details Name: Ronald Haney MRN: [id number] DOB: 06-19-1931   Cancelled Treatment:    Reason Eval/Treat Not Completed: Patient not medically ready. Order received, chart reviewed. Per TPA protocol, will hold OT evaluation 24 hours since initial administration of TPA (04/30/2018, 2039) and follow up imaging has been obtained. Will re-attempt as medically appropriate.   Jeni Salles, MPH, MS, OTR/L ascom [phone] 05/01/18, 8:20 AM

## 2018-05-01 NOTE — H&P (Signed)
[facility] [place] [facility]   PATIENT NAME: Ronald Haney    MR#:  [id number]  DATE OF BIRTH:  May 03, 1931  DATE OF ADMISSION:  04/30/2018  PRIMARY CARE PHYSICIAN: Barbette Reichmann, MD   REQUESTING/REFERRING PHYSICIAN: Darnelle Catalan, MD  CHIEF COMPLAINT:   Chief Complaint  Patient presents with  . Dizziness    HISTORY OF PRESENT ILLNESS:  Ronald Haney  is a 84 y.o. male who presents with chief complaint as above.  Patient presented to the ED with visual disturbance, diplopia, and some dizziness.  He was evaluated here by telemetry neurology for possibility of stroke and was deemed to be candidate for TPA.  His symptoms have improved significantly status post TPA.  Hospitalist were called for admission  PAST MEDICAL HISTORY:   Past Medical History:  Diagnosis Date  . GERD (gastroesophageal reflux disease)   . Hyperlipemia   . Hypertension   . Skin cancer   . Stroke [facility])      PAST SURGICAL HISTORY:   Past Surgical History:  Procedure Laterality Date  . ANKLE SURGERY    . ccy    . TRANSURETHRAL RESECTION OF PROSTATE       SOCIAL HISTORY:   Social History   Tobacco Use  . Smoking status: Never Smoker  . Smokeless tobacco: Never Used  Substance Use Topics  . Alcohol use: Not Currently     FAMILY HISTORY:   Family History  Problem Relation Age of Onset  . CAD Father      DRUG ALLERGIES:  No Known Allergies  MEDICATIONS AT HOME:   Prior to Admission medications   Medication Sig Start Date End Date Taking? Authorizing Provider  aspirin 81 MG chewable tablet Chew 81 mg by mouth daily.    Yes [provider]  atenolol (TENORMIN) 25 MG tablet Take 25 mg by mouth daily.    Yes [provider]  atorvastatin (LIPITOR) 40 MG tablet Take 1 tablet (40 mg total) by mouth daily. 12/24/17 12/24/18 Yes Auburn Bilberry, MD  fluticasone (FLONASE) 50 MCG/ACT nasal spray Place 2 sprays into both nostrils daily. 10/03/17  Yes  [provider]  gemfibrozil (LOPID) 600 MG tablet Take 600 mg by mouth 2 (two) times daily.    Yes [provider]  hydrochlorothiazide (HYDRODIURIL) 25 MG tablet Take 25 mg by mouth daily.    Yes [provider]  lisinopril (PRINIVIL,ZESTRIL) 30 MG tablet Take 30 mg by mouth daily. 11/20/17  Yes [provider]  omeprazole (PRILOSEC) 20 MG capsule Take 20 mg by mouth daily. 11/20/17  Yes [provider]    REVIEW OF SYSTEMS:  Review of Systems  Constitutional: Negative for chills, fever, malaise/fatigue and weight loss.  HENT: Negative for ear pain, hearing loss and tinnitus.   Eyes: Positive for double vision. Negative for blurred vision, pain and redness.  Respiratory: Negative for cough, hemoptysis and shortness of breath.   Cardiovascular: Negative for chest pain, palpitations, orthopnea and leg swelling.  Gastrointestinal: Negative for abdominal pain, constipation, diarrhea, nausea and vomiting.  Genitourinary: Negative for dysuria, frequency and hematuria.  Musculoskeletal: Negative for back pain, joint pain and neck pain.  Skin:       No acne, rash, or lesions  Neurological: Positive for dizziness. Negative for tremors, focal weakness and weakness.  Endo/Heme/Allergies: Negative for polydipsia. Does not bruise/bleed easily.  Psychiatric/Behavioral: Negative for depression. The patient is not nervous/anxious and does not have insomnia.      VITAL SIGNS:  Vitals:   04/30/18 2300 04/30/18 2330 05/01/18 0000 05/01/18 0030  BP: (!) 149/80 136/72 124/79 132/76  Pulse: 67 77 71 66  Resp: 20 15 19  (!) 21  Temp: 97.6 F (36.4 C)     TempSrc:      SpO2: 94% 95% 92% 91%  Weight:      Height:       Wt Readings from Last 3 Encounters:  04/30/18 79.4 kg  12/23/17 81.9 kg  09/30/15 85.7 kg    PHYSICAL EXAMINATION:  Physical Exam  Vitals reviewed. Constitutional: He is oriented to person, place, and time. He appears well-developed  and well-nourished. No distress.  HENT:  Head: Normocephalic and atraumatic.  Mouth/Throat: Oropharynx is clear and moist.  Eyes: Pupils are equal, round, and reactive to light. Conjunctivae and EOM are normal. No scleral icterus.  Neck: Normal range of motion. Neck supple. No JVD present. No thyromegaly present.  Cardiovascular: Normal rate, regular rhythm and intact distal pulses. Exam reveals no gallop and no friction rub.  No murmur heard. Respiratory: Effort normal and breath sounds normal. No respiratory distress. He has no wheezes. He has no rales.  GI: Soft. Bowel sounds are normal. He exhibits no distension. There is no tenderness.  Musculoskeletal: Normal range of motion. He exhibits no edema.  No arthritis, no gout  Lymphadenopathy:    He has no cervical adenopathy.  Neurological: He is alert and oriented to person, place, and time. No cranial nerve deficit.  Neurologic: Cranial nerves II-XII intact, Sensation intact to light touch/pinprick, 5/5 strength in all extremities, no dysarthria, no aphasia, no dysphagia, memory intact, finger to nose testing showed no abnormality, no pronator drift  Skin: Skin is warm and dry. No rash noted. No erythema.  Psychiatric: He has a normal mood and affect. His behavior is normal. Judgment and thought content normal.    LABORATORY PANEL:   CBC Recent Labs  Lab 04/30/18 1936  WBC 7.4  HGB 14.9  HCT 44.2  PLT 142*   ------------------------------------------------------------------------------------------------------------------  Chemistries  Recent Labs  Lab 04/30/18 1936  NA 134*  K 3.6  CL 101  CO2 25  GLUCOSE 133*  BUN 32*  CREATININE 1.14  CALCIUM 9.0  AST 23  ALT 19  ALKPHOS 73  BILITOT 0.8   ------------------------------------------------------------------------------------------------------------------  Cardiac Enzymes Recent Labs  Lab 04/30/18 1936  TROPONINI <0.03    ------------------------------------------------------------------------------------------------------------------  RADIOLOGY:  Ct Head Wo Contrast  Result Date: 04/30/2018 CLINICAL DATA:  Droopy eyelid after tPA EXAM: CT HEAD WITHOUT CONTRAST TECHNIQUE: Contiguous axial images were obtained from the base of the skull through the vertex without intravenous contrast. COMPARISON:  04/30/2018, 12/23/2017 FINDINGS: Brain: No acute territorial infarction, hemorrhage or intracranial mass. Stable atrophy and small vessel ischemic changes of the white matter. Chronic lacunar infarcts within the thalamus. Small chronic infarct in the left lateral frontal lobe. Vascular: No hyperdense vessels. Carotid vascular calcification. Vertebral artery calcification. Skull: No fracture or suspicious lesion. Sinuses/Orbits: No acute finding. Other: None IMPRESSION: 1. No CT evidence for acute intracranial abnormality. No significant change as compared with CT performed earlier today. 2. Atrophy and small vessel ischemic changes of the white matter. Chronic lacunar infarcts within the thalamus Electronically Signed   By: Jasmine Pang M.D.   On: 04/30/2018 23:24   Dg Chest Portable 1 View  Result Date: 04/30/2018 CLINICAL DATA:  Dizziness. EXAM: PORTABLE CHEST 1 VIEW COMPARISON:  09/30/2015 FINDINGS: The cardiomediastinal silhouette is within normal limits. The lungs are less well  inflated than on the prior study with minimal atelectasis in the lung bases. No edema, pleural effusion, or pneumothorax is identified. No acute osseous abnormality is seen. IMPRESSION: No active disease. Electronically Signed   By: Sebastian Ache M.D.   On: 04/30/2018 20:01   Ct Head Code Stroke Wo Contrast  Result Date: 04/30/2018 CLINICAL DATA:  Code stroke. 84 y/o M; dizziness and blurred vision for 1 hour. History of stroke. EXAM: CT HEAD WITHOUT CONTRAST TECHNIQUE: Contiguous axial images were obtained from the base of the skull through the  vertex without intravenous contrast. COMPARISON:  12/23/2017 CT head and MRI head. FINDINGS: Brain: No evidence of acute infarction, hemorrhage, hydrocephalus, extra-axial collection or mass lesion/mass effect. Small chronic lacunar infarcts are present within the bilateral thalami. There are mild chronic microvascular ischemic changes of the brain and volume loss. Very small chronic cortical infarction within the left lateral frontal lobe. Vascular: Calcific atherosclerosis of carotid siphons. No hyperdense vessel identified. Skull: Normal. Negative for fracture or focal lesion. Sinuses/Orbits: No acute finding. Other: None. ASPECTS [facility] Stroke Program Early CT Score) - Ganglionic level infarction (caudate, lentiform nuclei, internal capsule, insula, M1-M3 cortex): 7 - Supraganglionic infarction (M4-M6 cortex): 3 Total score (0-10 with 10 being normal): 10 IMPRESSION: 1. No acute intracranial abnormality identified.  ASPECTS is 10. 2. Stable chronic microvascular ischemic changes and volume loss of the brain. Stable small chronic infarcts within the thalami and left lateral frontal lobe. These results were called by telephone at the time of interpretation on 04/30/2018 at 8:10 pm to Dr. Dorothea Glassman , who verbally acknowledged these results. Electronically Signed   By: Mitzi Hansen M.D.   On: 04/30/2018 20:10    EKG:   Orders placed or performed during the hospital encounter of 04/30/18  . EKG 12-Lead  . EKG 12-Lead  . EKG 12-Lead  . EKG 12-Lead  . EKG 12-Lead  . EKG 12-Lead  . ED EKG  . ED EKG    IMPRESSION AND PLAN:  Principal Problem:   Visual disturbance -patient has history of prior stroke, came in today with double vision, ataxia, dizziness.  Seen by telemetry neurology and felt to be candidate for TPA.  TPA was administered.  Patient's symptoms have improved significantly since then.  We will admit him to the ICU status post TPA protocol with neurology consult Active  Problems:   HTN (hypertension) -blood pressure goal tonight per status post TPA protocol   HLD (hyperlipidemia) -Home dose antilipid   GERD (gastroesophageal reflux disease) -Home dose PPI  Chart review performed and case discussed with ED provider. Labs, imaging and/or ECG reviewed by provider and discussed with patient/family. Management plans discussed with the patient and/or family.  DVT PROPHYLAXIS: Mechanical only  GI PROPHYLAXIS:  PPI   ADMISSION STATUS: Inpatient  CODE STATUS: Full Code Status History    Date Active Date Inactive Code Status Order ID Comments User Context   12/23/2017 1048 12/24/2017 1529 Full Code [id number]  A[place] Bilberry, MD ED    Advance Directive Documentation     Most Recent Value  Type of Advance Directive  Healthcare Power of Attorney, Out of facility DNR (pink MOST or yellow form), Living will  Pre-existing out of facility DNR order (yellow form or pink MOST form)  -  "MOST" Form in Place?  -      TOTAL TIME TAKING CARE OF THIS PATIENT: 45 minutes.   Chairty Toman FIELDING 05/01/2018, 1:27 AM  [employer]  [phone]  CC: Primary care physician; Barbette Reichmann, MD  Note:  This document was prepared using Dragon voice recognition software and may include unintentional dictation errors.

## 2018-05-01 NOTE — Progress Notes (Signed)
[employer] - [place] [facility]   PATIENT NAME: Ronald Haney    MR#:  [id number]  DATE OF BIRTH:  Apr 30, 1931  SUBJECTIVE:  CHIEF COMPLAINT:   Chief Complaint  Patient presents with  . Dizziness    REVIEW OF SYSTEMS:    ROS  DRUG ALLERGIES:  No Known Allergies  VITALS:  Blood pressure 136/70, pulse 74, temperature 97.7 F (36.5 C), temperature source Oral, resp. rate 18, height 5\' 10"  (1.778 m), weight 79.4 kg, SpO2 92 %.  PHYSICAL EXAMINATION:   Physical Exam  GENERAL:  84 y.o.-year-old patient lying in the bed with no acute distress.  EYES: Pupils equal, round, reactive to light and accommodation. No scleral icterus. Extraocular muscles intact.  HEENT: Head atraumatic, normocephalic. Oropharynx and nasopharynx clear.  NECK:  Supple, no jugular venous distention. No thyroid enlargement, no tenderness.  LUNGS: Normal breath sounds bilaterally, no wheezing, rales, rhonchi. No use of accessory muscles of respiration.  CARDIOVASCULAR: S1, S2 normal. No murmurs, rubs, or gallops.  ABDOMEN: Soft, nontender, nondistended. Bowel sounds present. No organomegaly or mass.  EXTREMITIES: No cyanosis, clubbing or edema b/l.    NEUROLOGIC: Cranial nerves II through XII are intact. No focal Motor or sensory deficits b/l.   PSYCHIATRIC: The patient is alert and oriented x 3.  SKIN: No obvious rash, lesion, or ulcer.   LABORATORY PANEL:   CBC Recent Labs  Lab 05/01/18 0643  WBC 10.2  HGB 17.0  HCT 49.3  PLT 162   ------------------------------------------------------------------------------------------------------------------ Chemistries  Recent Labs  Lab 04/30/18 1936 05/01/18 0643  NA 134* 135  K 3.6 3.7  CL 101 102  CO2 25 24  GLUCOSE 133* 124*  BUN 32* 31*  CREATININE 1.14 1.03  CALCIUM 9.0 8.9  AST 23  --   ALT 19  --   ALKPHOS 73  --   BILITOT 0.8  --     ------------------------------------------------------------------------------------------------------------------  Cardiac Enzymes Recent Labs  Lab 04/30/18 1936  TROPONINI <0.03   ------------------------------------------------------------------------------------------------------------------  RADIOLOGY:  Ct Head Wo Contrast  Result Date: 04/30/2018 CLINICAL DATA:  Droopy eyelid after tPA EXAM: CT HEAD WITHOUT CONTRAST TECHNIQUE: Contiguous axial images were obtained from the base of the skull through the vertex without intravenous contrast. COMPARISON:  04/30/2018, 12/23/2017 FINDINGS: Brain: No acute territorial infarction, hemorrhage or intracranial mass. Stable atrophy and small vessel ischemic changes of the white matter. Chronic lacunar infarcts within the thalamus. Small chronic infarct in the left lateral frontal lobe. Vascular: No hyperdense vessels. Carotid vascular calcification. Vertebral artery calcification. Skull: No fracture or suspicious lesion. Sinuses/Orbits: No acute finding. Other: None IMPRESSION: 1. No CT evidence for acute intracranial abnormality. No significant change as compared with CT performed earlier today. 2. Atrophy and small vessel ischemic changes of the white matter. Chronic lacunar infarcts within the thalamus Electronically Signed   By: Jasmine Pang M.D.   On: 04/30/2018 23:24   Mr Maxine Glenn Neck W Wo Contrast  Result Date: 05/01/2018 CLINICAL DATA:  Visual disturbance with double vision and dizziness. Improved symptoms after tPA EXAM: MR HEAD WITHOUT CONTRAST MR CIRCLE OF WILLIS WITHOUT CONTRAST MRA OF THE NECK WITHOUT AND WITH CONTRAST TECHNIQUE: Multiplanar, multiecho pulse sequences of the brain, circle of willis and surrounding structures were obtained without intravenous contrast. Angiographic images of the neck were obtained using MRA technique without and with intravenous contrast. CONTRAST:  7.5 cc Gadavist intravenous COMPARISON:  12/23/2017  FINDINGS: MR HEAD FINDINGS Brain: 7 mm acute infarct in the midbrain, just  anterior and right para median to the cerebral aqua duct. Remote lacunar infarcts in the bilateral medial thalamus. Small remote left frontal cortex infarcts seen on FLAIR imaging image 36. No acute hemorrhage, hydrocephalus, or masslike finding. Vascular: Arterial findings described below. Skull and upper cervical spine: Scalp scarring at the vertex. Negative for marrow lesion Sinuses/Orbits: Bilateral cataract resection. MR CIRCLE OF WILLIS FINDINGS Lower V4 segments are incompletely covered but are seen on neck MRA. The left vertebral artery is dominant. Bilateral PICA patency. Small basilar in the setting of hypoplastic P1 segments, especially on the right. No branch occlusion, beading, or aneurysm. There is mild atheromatous irregularity of the basilar. MRA NECK FINDINGS Time-of-flight shows antegrade flow in both carotid and vertebral circulations. Postcontrast imaging shows the arch which is normal diameter with 3 vessel branching. There is atheromatous luminal irregularity at the carotid bifurcations without flow limiting stenosis or ulceration. Proximal subclaviansare widely patent. There is moderate narrowing at the origin the right vertebral artery measured at 50%. Mild atheromatous irregularity of the right V4 segment. IMPRESSION: Brain MRI: 1. Acute lacunar infarct in the right para median midbrain. 2. Remote lacunar infarcts in the bilateral medial thalamus. Intracranial MRA: 1. No emergent finding. 2. Atherosclerosis without flow limiting stenosis. Neck MRA: 1. 50% narrowing at the origin of the non dominant right vertebral artery. 2. Cervical carotid atherosclerosis without flow limiting stenosis. Electronically Signed   By: Marnee Spring M.D.   On: 05/01/2018 10:25   Mr Brain Wo Contrast  Result Date: 05/01/2018 CLINICAL DATA:  Visual disturbance with double vision and dizziness. Improved symptoms after tPA EXAM: MR  HEAD WITHOUT CONTRAST MR CIRCLE OF WILLIS WITHOUT CONTRAST MRA OF THE NECK WITHOUT AND WITH CONTRAST TECHNIQUE: Multiplanar, multiecho pulse sequences of the brain, circle of willis and surrounding structures were obtained without intravenous contrast. Angiographic images of the neck were obtained using MRA technique without and with intravenous contrast. CONTRAST:  7.5 cc Gadavist intravenous COMPARISON:  12/23/2017 FINDINGS: MR HEAD FINDINGS Brain: 7 mm acute infarct in the midbrain, just anterior and right para median to the cerebral aqua duct. Remote lacunar infarcts in the bilateral medial thalamus. Small remote left frontal cortex infarcts seen on FLAIR imaging image 36. No acute hemorrhage, hydrocephalus, or masslike finding. Vascular: Arterial findings described below. Skull and upper cervical spine: Scalp scarring at the vertex. Negative for marrow lesion Sinuses/Orbits: Bilateral cataract resection. MR CIRCLE OF WILLIS FINDINGS Lower V4 segments are incompletely covered but are seen on neck MRA. The left vertebral artery is dominant. Bilateral PICA patency. Small basilar in the setting of hypoplastic P1 segments, especially on the right. No branch occlusion, beading, or aneurysm. There is mild atheromatous irregularity of the basilar. MRA NECK FINDINGS Time-of-flight shows antegrade flow in both carotid and vertebral circulations. Postcontrast imaging shows the arch which is normal diameter with 3 vessel branching. There is atheromatous luminal irregularity at the carotid bifurcations without flow limiting stenosis or ulceration. Proximal subclaviansare widely patent. There is moderate narrowing at the origin the right vertebral artery measured at 50%. Mild atheromatous irregularity of the right V4 segment. IMPRESSION: Brain MRI: 1. Acute lacunar infarct in the right para median midbrain. 2. Remote lacunar infarcts in the bilateral medial thalamus. Intracranial MRA: 1. No emergent finding. 2.  Atherosclerosis without flow limiting stenosis. Neck MRA: 1. 50% narrowing at the origin of the non dominant right vertebral artery. 2. Cervical carotid atherosclerosis without flow limiting stenosis. Electronically Signed   By: Kathrynn Ducking.D.  On: 05/01/2018 10:25   Dg Chest Portable 1 View  Result Date: 04/30/2018 CLINICAL DATA:  Dizziness. EXAM: PORTABLE CHEST 1 VIEW COMPARISON:  09/30/2015 FINDINGS: The cardiomediastinal silhouette is within normal limits. The lungs are less well inflated than on the prior study with minimal atelectasis in the lung bases. No edema, pleural effusion, or pneumothorax is identified. No acute osseous abnormality is seen. IMPRESSION: No active disease. Electronically Signed   By: Sebastian Ache M.D.   On: 04/30/2018 20:01   Mr Maxine Glenn Head Wo Contrast  Result Date: 05/01/2018 CLINICAL DATA:  Visual disturbance with double vision and dizziness. Improved symptoms after tPA EXAM: MR HEAD WITHOUT CONTRAST MR CIRCLE OF WILLIS WITHOUT CONTRAST MRA OF THE NECK WITHOUT AND WITH CONTRAST TECHNIQUE: Multiplanar, multiecho pulse sequences of the brain, circle of willis and surrounding structures were obtained without intravenous contrast. Angiographic images of the neck were obtained using MRA technique without and with intravenous contrast. CONTRAST:  7.5 cc Gadavist intravenous COMPARISON:  12/23/2017 FINDINGS: MR HEAD FINDINGS Brain: 7 mm acute infarct in the midbrain, just anterior and right para median to the cerebral aqua duct. Remote lacunar infarcts in the bilateral medial thalamus. Small remote left frontal cortex infarcts seen on FLAIR imaging image 36. No acute hemorrhage, hydrocephalus, or masslike finding. Vascular: Arterial findings described below. Skull and upper cervical spine: Scalp scarring at the vertex. Negative for marrow lesion Sinuses/Orbits: Bilateral cataract resection. MR CIRCLE OF WILLIS FINDINGS Lower V4 segments are incompletely covered but are seen on  neck MRA. The left vertebral artery is dominant. Bilateral PICA patency. Small basilar in the setting of hypoplastic P1 segments, especially on the right. No branch occlusion, beading, or aneurysm. There is mild atheromatous irregularity of the basilar. MRA NECK FINDINGS Time-of-flight shows antegrade flow in both carotid and vertebral circulations. Postcontrast imaging shows the arch which is normal diameter with 3 vessel branching. There is atheromatous luminal irregularity at the carotid bifurcations without flow limiting stenosis or ulceration. Proximal subclaviansare widely patent. There is moderate narrowing at the origin the right vertebral artery measured at 50%. Mild atheromatous irregularity of the right V4 segment. IMPRESSION: Brain MRI: 1. Acute lacunar infarct in the right para median midbrain. 2. Remote lacunar infarcts in the bilateral medial thalamus. Intracranial MRA: 1. No emergent finding. 2. Atherosclerosis without flow limiting stenosis. Neck MRA: 1. 50% narrowing at the origin of the non dominant right vertebral artery. 2. Cervical carotid atherosclerosis without flow limiting stenosis. Electronically Signed   By: Marnee Spring M.D.   On: 05/01/2018 10:25   Ct Head Code Stroke Wo Contrast  Result Date: 04/30/2018 CLINICAL DATA:  Code stroke. 84 y/o M; dizziness and blurred vision for 1 hour. History of stroke. EXAM: CT HEAD WITHOUT CONTRAST TECHNIQUE: Contiguous axial images were obtained from the base of the skull through the vertex without intravenous contrast. COMPARISON:  12/23/2017 CT head and MRI head. FINDINGS: Brain: No evidence of acute infarction, hemorrhage, hydrocephalus, extra-axial collection or mass lesion/mass effect. Small chronic lacunar infarcts are present within the bilateral thalami. There are mild chronic microvascular ischemic changes of the brain and volume loss. Very small chronic cortical infarction within the left lateral frontal lobe. Vascular: Calcific  atherosclerosis of carotid siphons. No hyperdense vessel identified. Skull: Normal. Negative for fracture or focal lesion. Sinuses/Orbits: No acute finding. Other: None. ASPECTS [facility] Stroke Program Early CT Score) - Ganglionic level infarction (caudate, lentiform nuclei, internal capsule, insula, M1-M3 cortex): 7 - Supraganglionic infarction (M4-M6 cortex): 3 Total score (0-10  with 10 being normal): 10 IMPRESSION: 1. No acute intracranial abnormality identified.  ASPECTS is 10. 2. Stable chronic microvascular ischemic changes and volume loss of the brain. Stable small chronic infarcts within the thalami and left lateral frontal lobe. These results were called by telephone at the time of interpretation on 04/30/2018 at 8:10 pm to Dr. Dorothea Glassman , who verbally acknowledged these results. Electronically Signed   By: Mitzi Hansen M.D.   On: 04/30/2018 20:10     ASSESSMENT AND PLAN:   * Acute right midbrain infarct TPA.  Now in ICU.  Improved symptoms.  Waiting on echocardiogram and repeat CT scan.  Appreciate neurology input. Aspirin Plavix after CT results.  *Hypertension.  Permissive hypertension  All the records are reviewed and case discussed with Care Management/Social Worker Management plans discussed with the patient, family and they are in agreement.  CODE STATUS: FULL CODE  DVT Prophylaxis: SCDs  TOTAL TIME TAKING CARE OF THIS PATIENT: 30 minutes.   POSSIBLE D/C IN 1-2 DAYS, DEPENDING ON CLINICAL CONDITION.  Molinda Bailiff Jakiya Bookbinder M.D on 05/01/2018 at 1:37 PM  Between 7am to 6pm - Pager - [phone]  After 6pm go to [email or web address] - password [email or web address] [facility]  [facility]  Office  [phone]  CC: Primary care physician; Barbette Reichmann, MD  Note: This dictation was prepared with Dragon dictation along with smaller phrase technology. Any transcriptional errors that result from this process are unintentional.

## 2018-05-01 NOTE — Progress Notes (Signed)
Pharmacy Electrolyte Monitoring Consult:  Pharmacy consulted to assist in monitoring and replacing electrolytes in this 84 y.o. male admitted on 04/30/2018 with Stroke s/p TPA administration in the ED.   Labs:  Sodium (mmol/L)  Date Value  05/01/2018 135  06/22/2013 138   Potassium (mmol/L)  Date Value  05/01/2018 3.7  06/22/2013 4.6   Calcium (mg/dL)  Date Value  05/01/2018 8.9   Calcium, Total (mg/dL)  Date Value  06/22/2013 9.0   Albumin (g/dL)  Date Value  04/30/2018 4.1    Assessment/Plan: Will order magnesium 1g IV x 1 to maintain magnesium ~2.   Will obtain follow up electrolytes with am labs.   Pharmacy will continue to monitor and adjust per consult.    , L 05/01/2018 2:15 PM

## 2018-05-01 NOTE — Progress Notes (Signed)
*  PRELIMINARY RESULTS* Echocardiogram 2D Echocardiogram has been performed.  Sherrie Sport 05/01/2018, 1:04 PM

## 2018-05-01 NOTE — ED Notes (Signed)
Admitting at the bedside for pt evaluation 

## 2018-05-01 NOTE — Consult Note (Signed)
Name: Ronald Haney MRN: [id number] DOB: 1931/05/01    ADMISSION DATE:  04/30/2018 CONSULTATION DATE: 05/01/2018  REFERRING MD : Dr. Jannifer Franklin  CHIEF COMPLAINT: Visual Disturbance   BRIEF PATIENT DESCRIPTION:  84 yo male admitted dizziness and visual disturbance. Head CT c/w ischemic stroke. Now s/p tPA   SIGNIFICANT EVENTS/STUDIES:  11/4-CT Head Code Stroke revealed no acute intracranial abnormality identified.  ASPECTS is 10. Stable chronic microvascular ischemic changes and volume loss of the brain. Stable small chronic infarcts within the thalami and left lateral frontal lobe. 11/4-CT Head revealed no CT evidence for acute intracranial abnormality. No significant change as compared with CT performed earlier today. Atrophy and small vessel ischemic changes of the white matter. Chronic lacunar infarcts within the thalamus 11/4-Pt admitted to ICU s/p TPA  HISTORY OF PRESENT ILLNESS:   This is an 84 yo male with a PMH of CVA, Skin Cancer, HTN, Hyperlipidemia, and GERD.  He presented to [facility] ER via EMS on 11/4 with c/o double vision worse on the left and lightheadedness 1hr prior to presentation to the ER. When EMS arrived at pts home he vomited x2 requiring 4 mg of zofran, vss, and stroke screening negative at that time.  Per ER notes upon arrival to the ER pt c/o diplopia and dizziness code stroke initiated. EKG revealed normal sinus rhythm, minimal ST elevation inferior leads pt denied chest pain and CXR negative.  Head CT concerning for ischemic stroke (possible posterior circulation infarct) ASPECTS 10.  Pt deemed TPA candidate per tele-neurology, therefore TPA administered in the ER resulting in significant improvement fo pts symptoms post TPA.  He was subsequently admitted to ICU by hospitalist team for additional workup and treatment.    PAST MEDICAL HISTORY :   has a past medical history of GERD (gastroesophageal reflux disease), Hyperlipemia, Hypertension, Skin cancer, and Stroke  ([place]).  has a past surgical history that includes ccy; Transurethral resection of prostate; and Ankle surgery. Prior to Admission medications   Medication Sig Start Date End Date Taking? Authorizing Provider  aspirin 81 MG chewable tablet Chew 81 mg by mouth daily.    Yes [provider]  atenolol (TENORMIN) 25 MG tablet Take 25 mg by mouth daily.    Yes [provider]  atorvastatin (LIPITOR) 40 MG tablet Take 1 tablet (40 mg total) by mouth daily. 12/24/17 12/24/18 Yes Dustin Flock, MD  fluticasone (FLONASE) 50 MCG/ACT nasal spray Place 2 sprays into both nostrils daily. 10/03/17  Yes [provider]  gemfibrozil (LOPID) 600 MG tablet Take 600 mg by mouth 2 (two) times daily.    Yes [provider]  hydrochlorothiazide (HYDRODIURIL) 25 MG tablet Take 25 mg by mouth daily.    Yes [provider]  lisinopril (PRINIVIL,ZESTRIL) 30 MG tablet Take 30 mg by mouth daily. 11/20/17  Yes [provider]  omeprazole (PRILOSEC) 20 MG capsule Take 20 mg by mouth daily. 11/20/17  Yes [provider]   No Known Allergies  FAMILY HISTORY:  family history includes CAD in his father. SOCIAL HISTORY:  reports that he has never smoked. He has never used smokeless tobacco. He reports that he drank alcohol. He reports that he has current or past drug history.  REVIEW OF SYSTEMS: Positives in BOLD  Constitutional: Negative for fever, chills, weight loss, malaise/fatigue and diaphoresis.  HENT: Negative for hearing loss, ear pain, nosebleeds, congestion, sore throat, neck pain, tinnitus and ear discharge.   Eyes: blurred vision, double vision, photophobia, pain, discharge  and redness.  Respiratory: Negative for cough, hemoptysis, sputum production, shortness of breath, wheezing and stridor.   Cardiovascular: Negative for chest pain, palpitations, orthopnea, claudication, leg swelling and PND.  Gastrointestinal: Negative for heartburn, nausea, vomiting,  abdominal pain, diarrhea, constipation, blood in stool and melena.  Genitourinary: Negative for dysuria, urgency, frequency, hematuria and flank pain.  Musculoskeletal: Negative for myalgias, back pain, joint pain and falls.  Skin: Negative for itching and rash.  Neurological: dizziness, tingling, tremors, sensory change, speech change, focal weakness, seizures, loss of consciousness, weakness and headaches.  Endo/Heme/Allergies: Negative for environmental allergies and polydipsia. Does not bruise/bleed easily.  SUBJECTIVE:  C/o slight dizziness no other complaints at this time states left double vision has improved significantly   VITAL SIGNS: Temp:  [97.3 F (36.3 C)-97.6 F (36.4 C)] 97.6 F (36.4 C) (11/04 2300) Pulse Rate:  [57-77] 65 (11/05 0130) Resp:  [14-24] 17 (11/05 0130) BP: (122-162)/(60-80) 123/75 (11/05 0130) SpO2:  [90 %-98 %] 92 % (11/05 0130) Weight:  [79.4 kg] 79.4 kg (11/04 1932)  PHYSICAL EXAMINATION: General: well developed, well nourished male, NAD  Neuro: alert and oriented, follows commands, PERRLA, BUE 5/5 motor strength, BLE 5/5 motor strength  HEENT: supple, no JVD  Cardiovascular: nsr, rrr, no R/G Lungs: clear throughout, even, non labored  Abdomen: +BS x4, soft, obese, non tender, non distended  Musculoskeletal: normal bulk and tone, no edema  Skin: intact no rashes or lesions  Recent Labs  Lab 04/30/18 1936  NA 134*  K 3.6  CL 101  CO2 25  BUN 32*  CREATININE 1.14  GLUCOSE 133*   Recent Labs  Lab 04/30/18 1936  HGB 14.9  HCT 44.2  WBC 7.4  PLT 142*   Ct Head Wo Contrast  Result Date: 04/30/2018 CLINICAL DATA:  Droopy eyelid after tPA EXAM: CT HEAD WITHOUT CONTRAST TECHNIQUE: Contiguous axial images were obtained from the base of the skull through the vertex without intravenous contrast. COMPARISON:  04/30/2018, 12/23/2017 FINDINGS: Brain: No acute territorial infarction, hemorrhage or intracranial mass. Stable atrophy and small vessel  ischemic changes of the white matter. Chronic lacunar infarcts within the thalamus. Small chronic infarct in the left lateral frontal lobe. Vascular: No hyperdense vessels. Carotid vascular calcification. Vertebral artery calcification. Skull: No fracture or suspicious lesion. Sinuses/Orbits: No acute finding. Other: None IMPRESSION: 1. No CT evidence for acute intracranial abnormality. No significant change as compared with CT performed earlier today. 2. Atrophy and small vessel ischemic changes of the white matter. Chronic lacunar infarcts within the thalamus Electronically Signed   By: Donavan Foil M.D.   On: 04/30/2018 23:24   Dg Chest Portable 1 View  Result Date: 04/30/2018 CLINICAL DATA:  Dizziness. EXAM: PORTABLE CHEST 1 VIEW COMPARISON:  09/30/2015 FINDINGS: The cardiomediastinal silhouette is within normal limits. The lungs are less well inflated than on the prior study with minimal atelectasis in the lung bases. No edema, pleural effusion, or pneumothorax is identified. No acute osseous abnormality is seen. IMPRESSION: No active disease. Electronically Signed   By: Logan Bores M.D.   On: 04/30/2018 20:01   Ct Head Code Stroke Wo Contrast  Result Date: 04/30/2018 CLINICAL DATA:  Code stroke. 84 y/o M; dizziness and blurred vision for 1 hour. History of stroke. EXAM: CT HEAD WITHOUT CONTRAST TECHNIQUE: Contiguous axial images were obtained from the base of the skull through the vertex without intravenous contrast. COMPARISON:  12/23/2017 CT head and MRI head. FINDINGS: Brain: No evidence of acute infarction, hemorrhage, hydrocephalus, extra-axial  collection or mass lesion/mass effect. Small chronic lacunar infarcts are present within the bilateral thalami. There are mild chronic microvascular ischemic changes of the brain and volume loss. Very small chronic cortical infarction within the left lateral frontal lobe. Vascular: Calcific atherosclerosis of carotid siphons. No hyperdense vessel  identified. Skull: Normal. Negative for fracture or focal lesion. Sinuses/Orbits: No acute finding. Other: None. ASPECTS [facility] Stroke Program Early CT Score) - Ganglionic level infarction (caudate, lentiform nuclei, internal capsule, insula, M1-M3 cortex): 7 - Supraganglionic infarction (M4-M6 cortex): 3 Total score (0-10 with 10 being normal): 10 IMPRESSION: 1. No acute intracranial abnormality identified.  ASPECTS is 10. 2. Stable chronic microvascular ischemic changes and volume loss of the brain. Stable small chronic infarcts within the thalami and left lateral frontal lobe. These results were called by telephone at the time of interpretation on 04/30/2018 at 8:10 pm to Dr. Conni Slipper , who verbally acknowledged these results. Electronically Signed   By: Kristine Garbe M.D.   On: 04/30/2018 20:10    ASSESSMENT / PLAN:  Possible acute posterior circulation infarct s/p TPA Hx: Hyperlipidemia, CVA, and HTN  Continuous telemetry monitoring  Neurology consulted appreciate input MR Brain and Neck pending  Repeat CT Brain 24 hrs post TPA US Carotid and Echo pending  Urine drug screen pending  Neuro checks per stroke protocol  No antiplatelet agents or anticoagulants (including heparin for DVT prophylaxis) in first 24 hours s/p TPA No foley catheter, nasogastric tube, arterial catheter or central venous catheter for 24 hr, unless absolutely necessary Bedside swallowing evaluation  Continue atorvastatin  Will allow for permissive HTN for perfusion  Trend CBC  Monitor for s/sx of bleeding and transfuse for hgb <7 PT/OT consulted appreciate input   Marda Stalker, [place] Pager [phone] (please enter 7 digits) PCCM Consult Pager [phone] (please enter 7 digits)

## 2018-05-01 NOTE — Progress Notes (Signed)
   05/01/18 2100  Clinical Encounter Type  Visited With Patient  Visit Type Follow-up  Spiritual Encounters  Spiritual Needs Other (Comment) (Pt was coping well)

## 2018-05-01 NOTE — Progress Notes (Signed)
PT Cancellation Note  Patient Details Name: DEWITT JUDICE MRN: [id number] DOB: 1930/09/05   Cancelled Treatment:    Reason Eval/Treat Not Completed: Patient not medically ready.  Order received and chart reviewed.  Will hold patient 24 hours from initiation of Activase, administered at 2042 on 04/30/2018, per protocol.  Will resume PT evaluation process with pt when appropriate.   Roxanne Gates, PT, DPT 05/01/2018, 8:26 AM

## 2018-05-01 NOTE — Progress Notes (Signed)
SLP Cancellation Note  Patient Details Name: Ronald Haney MRN: [id number] DOB: 01-11-1931   Cancelled treatment:       Reason Eval/Treat Not Completed: SLP screened, no needs identified, will sign off(chart reviewed; consulted NSG then met w/ pt/family). Pt denied any difficulty swallowing and is currently on a regular diet; tolerates swallowing pills w/ water per NSG. Pt conversed at conversational level w/out deficits noted; 100% intelligibility. Pt and family denied any speech-language deficits. They stated pt is at his baseline w/ his speech post a CVA in the Spring/Summer timeframe in which he received Speech Tx following; he feels he "has a word or two that just doesn't come to me easily". He verbalized he remembered his strategies from therapy.  No further skilled ST services indicated as pt appears at his baseline. Pt agreed. NSG to reconsult if any change in status while admitted.     Orinda Kenner, MS, CCC-SLP Watson,Katherine 05/01/2018, 1:32 PM

## 2018-05-01 NOTE — Consult Note (Addendum)
Referring Physician: Lance Coon, MD    Chief Complaint: Dizziness, double vision and unsteadiness  HPI: Ronald Haney is an 84 y.o. male with pertinent history of stroke, hyperlipidemia, hypertension, melanoma presenting to the ED on 04/30/2018 with chief complaints of dizziness, double vision and unsteadiness.  Patient states that he was taking a shower and got lightheaded and began to have double vision and unsteadiness.  Per patient's wife, patient came out of the bathroom staggering and was holding onto walls for support.  He also complained of blurriness which seems to be worse on the left with notable right eye ptosis. Denies associated altered sensorium, speech abnormality, seizures, focal motor or sensory deficits, nausea or vomiting, ipsilateral or contralateral paralysis/weakness, numbness or tingling, or other focal neurologic deficit.Marland Kitchen He denies history of head injury or trauma. He was previously evaluated for stroke in 11/2017 in which he presented with slurred speech difficulty finding words, MRI of the brain at that time showed acute infarct in the lateral left frontal cortex.  He was discharged home with 2 baby aspirin.  Per wife he has been taking his aspirin consistently prior to this event.  Due to his previous history of stroke patient decided to present to the ED for further evaluation.  On arrival to the ED code stroke was initiated and patient evaluated by a tele-neurologist.  Initial NIH stroke scale initial 2.  CT head did not show acute intracranial abnormality.  Patient was deemed a candidate for IV TPA.  Patient received tPA  Total dose 71.5 mg at 20.39:56 and tolerated well without adverse effects.  He was transferred to the ICU for post tPA monitoring and further stroke work-up.  Date last known well: Date: 04/30/2018 Time last known well: Time: 18:30 tPA Given: Yes  Past Medical History:  Diagnosis Date  . GERD (gastroesophageal reflux disease)   . Hyperlipemia   .  Hypertension   . Skin cancer   . Stroke [employer] [facility])     Past Surgical History:  Procedure Laterality Date  . ANKLE SURGERY    . ccy    . TRANSURETHRAL RESECTION OF PROSTATE      Family History  Problem Relation Age of Onset  . CAD Father    Social History:  reports that he has never smoked. He has never used smokeless tobacco. He reports that he drank alcohol. He reports that he has current or past drug history.  Allergies: No Known Allergies  Medications:  I have reviewed the patient's current medications. Prior to Admission:  Medications Prior to Admission  Medication Sig Dispense Refill Last Dose  . aspirin 81 MG chewable tablet Chew 81 mg by mouth daily.    04/30/2018 at am  . atenolol (TENORMIN) 25 MG tablet Take 25 mg by mouth daily.    04/30/2018 at 0630  . atorvastatin (LIPITOR) 40 MG tablet Take 1 tablet (40 mg total) by mouth daily. 30 tablet 11 04/30/2018 at am  . fluticasone (FLONASE) 50 MCG/ACT nasal spray Place 2 sprays into both nostrils daily.   04/30/2018 at am  . gemfibrozil (LOPID) 600 MG tablet Take 600 mg by mouth 2 (two) times daily.   3 04/30/2018 at am  . hydrochlorothiazide (HYDRODIURIL) 25 MG tablet Take 25 mg by mouth daily.   3 04/30/2018 at am  . lisinopril (PRINIVIL,ZESTRIL) 30 MG tablet Take 30 mg by mouth daily.  3 04/30/2018 at am  . omeprazole (PRILOSEC) 20 MG capsule Take 20 mg by mouth daily.  3 04/30/2018  at am   Scheduled: .  stroke: mapping our early stages of recovery book   Does not apply Once  . atorvastatin  40 mg Oral Daily  . pantoprazole  40 mg Oral Daily    ROS: History obtained from the patient   General ROS: negative for - chills, fatigue, fever, night sweats, weight gain or weight loss Psychological ROS: negative for - behavioral disorder, hallucinations, memory difficulties, mood swings or suicidal ideation Ophthalmic ROS: negative for - blurry vision, double vision, eye pain or loss of vision ENT ROS: negative for - epistaxis, nasal  discharge, oral lesions, sore throat, tinnitus or vertigo Allergy and Immunology ROS: negative for - hives or itchy/watery eyes Hematological and Lymphatic ROS: negative for - bleeding problems, bruising or swollen lymph nodes Endocrine ROS: negative for - galactorrhea, hair pattern changes, polydipsia/polyuria or temperature intolerance Respiratory ROS: negative for - cough, hemoptysis, shortness of breath or wheezing Cardiovascular ROS: negative for - chest pain, dyspnea on exertion, edema or irregular heartbeat Gastrointestinal ROS: negative for - abdominal pain, diarrhea, hematemesis, nausea/vomiting or stool incontinence Genito-Urinary ROS: negative for - dysuria, hematuria, incontinence or urinary frequency/urgency Musculoskeletal ROS: negative for - joint swelling or muscular weakness Neurological ROS: as noted in HPI Dermatological ROS: negative for rash and skin lesion changes  Physical Examination: Blood pressure 136/70, pulse 74, temperature 97.7 F (36.5 C), temperature source Oral, resp. rate 18, height 5\' 10"  (1.778 m), weight 79.4 kg, SpO2 92 %.   HEENT-  Normocephalic, no lesions, without obvious abnormality.  Normal external eye and conjunctiva.  Normal TM's bilaterally.  Normal auditory canals and external ears. Normal external nose, mucus membranes and septum.  Normal pharynx. Cardiovascular- S1, S2 normal, pulses palpable throughout   Lungs- chest clear, no wheezing, rales, normal symmetric air entry Abdomen- soft, non-tender; bowel sounds normal; no masses,  no organomegaly Extremities- no edema Lymph-no adenopathy palpable Musculoskeletal-no joint tenderness, deformity or swelling Skin-warm and dry, no hyperpigmentation, vitiligo, or suspicious lesions  Neurological Exam   Mental Status: Alert, oriented, thought content appropriate.  Speech fluent without evidence of aphasia.  Able to follow 3 step commands without difficulty. Attention span and concentration seemed  appropriate  Cranial Nerves: II: Discs flat bilaterally; Visual fields grossly normal, pupils equal, round, reactive to light and accommodation III,IV, VI: ptosis not present, Unable to go beyond midline nasally with the right eye.   V,VII: smile symmetric, facial light touch sensation intact VIII: hearing normal bilaterally IX,X: gag reflex present XI: bilateral shoulder shrug XII: midline tongue extension Motor: Right :  Upper extremity   5/5 Without pronator drift      Left: Upper extremity   5/5 without pronator drift Right:   Lower extremity   5/5                                          Left: Lower extremity   5/5 Tone and bulk:normal tone throughout; no atrophy noted Sensory: Pinprick and light touch intact bilaterally Deep Tendon Reflexes: 2+ and symmetric throughout Plantars: Right: mute                              Left: mute Cerebellar: Finger-to-nose testing intact bilaterally. Heel to shin testing normal bilaterally Gait: not tested due to safety concerns  Data Reviewed  Laboratory Studies:  Basic Metabolic Panel:  Recent Labs  Lab 04/30/18 1936 05/01/18 0643  NA 134* 135  K 3.6 3.7  CL 101 102  CO2 25 24  GLUCOSE 133* 124*  BUN 32* 31*  CREATININE 1.14 1.03  CALCIUM 9.0 8.9    Liver Function Tests: Recent Labs  Lab 04/30/18 1936  AST 23  ALT 19  ALKPHOS 73  BILITOT 0.8  PROT 7.1  ALBUMIN 4.1   No results for input(s): LIPASE, AMYLASE in the last 168 hours. No results for input(s): AMMONIA in the last 168 hours.  CBC: Recent Labs  Lab 04/30/18 1936 05/01/18 0643  WBC 7.4 10.2  NEUTROABS 4.0 6.7  HGB 14.9 17.0  HCT 44.2 49.3  MCV 96.5 95.9  PLT 142* 162    Cardiac Enzymes: Recent Labs  Lab 04/30/18 1936  TROPONINI <0.03    BNP: Invalid input(s): POCBNP  CBG: Recent Labs  Lab 04/30/18 1949 05/01/18 0251  GLUCAP 112* 109*    Microbiology: Results for orders placed or performed during the hospital encounter of 04/30/18   MRSA PCR Screening     Status: None   Collection Time: 05/01/18  3:11 AM  Result Value Ref Range Status   MRSA by PCR NEGATIVE NEGATIVE Final    Comment:        The GeneXpert MRSA Assay (FDA approved for NASAL specimens only), is one component of a comprehensive MRSA colonization surveillance program. It is not intended to diagnose MRSA infection nor to guide or monitor treatment for MRSA infections. Performed at [facility], [address] [place], [place] [address]     Coagulation Studies: Recent Labs    04/30/18 1946  LABPROT 12.9  INR 0.98    Urinalysis: No results for input(s): COLORURINE, LABSPEC, PHURINE, GLUCOSEU, HGBUR, BILIRUBINUR, KETONESUR, PROTEINUR, UROBILINOGEN, NITRITE, LEUKOCYTESUR in the last 168 hours.  Invalid input(s): APPERANCEUR  Lipid Panel:    Component Value Date/Time   CHOL 101 05/01/2018 0643   TRIG 42 05/01/2018 0643   HDL 35 (L) 05/01/2018 0643   CHOLHDL 2.9 05/01/2018 0643   VLDL 8 05/01/2018 0643   LDLCALC 58 05/01/2018 0643    HgbA1C:  Lab Results  Component Value Date   HGBA1C 6.2 (H) 12/24/2017    Urine Drug Screen:  No results found for: LABOPIA, COCAINSCRNUR, LABBENZ, AMPHETMU, THCU, LABBARB  Alcohol Level:  Recent Labs  Lab 04/30/18 1946  ETH <10    Other results: EKG: unchanged from previous tracings, junctional rythm. Vent. rate 61 BPM PR interval * ms QRS duration 111 ms QT/QTc 427/431 ms P-R-T axes * 6 66  Imaging: Ct Head Wo Contrast  Result Date: 04/30/2018 CLINICAL DATA:  Droopy eyelid after tPA EXAM: CT HEAD WITHOUT CONTRAST TECHNIQUE: Contiguous axial images were obtained from the base of the skull through the vertex without intravenous contrast. COMPARISON:  04/30/2018, 12/23/2017 FINDINGS: Brain: No acute territorial infarction, hemorrhage or intracranial mass. Stable atrophy and small vessel ischemic changes of the white matter. Chronic lacunar infarcts within the thalamus. Small chronic  infarct in the left lateral frontal lobe. Vascular: No hyperdense vessels. Carotid vascular calcification. Vertebral artery calcification. Skull: No fracture or suspicious lesion. Sinuses/Orbits: No acute finding. Other: None IMPRESSION: 1. No CT evidence for acute intracranial abnormality. No significant change as compared with CT performed earlier today. 2. Atrophy and small vessel ischemic changes of the white matter. Chronic lacunar infarcts within the thalamus Electronically Signed   By: Donavan Foil M.D.   On: 04/30/2018 23:24   Dg Chest  Portable 1 View  Result Date: 04/30/2018 CLINICAL DATA:  Dizziness. EXAM: PORTABLE CHEST 1 VIEW COMPARISON:  09/30/2015 FINDINGS: The cardiomediastinal silhouette is within normal limits. The lungs are less well inflated than on the prior study with minimal atelectasis in the lung bases. No edema, pleural effusion, or pneumothorax is identified. No acute osseous abnormality is seen. IMPRESSION: No active disease. Electronically Signed   By: Logan Bores M.D.   On: 04/30/2018 20:01   Ct Head Code Stroke Wo Contrast  Result Date: 04/30/2018 CLINICAL DATA:  Code stroke. 84 y/o M; dizziness and blurred vision for 1 hour. History of stroke. EXAM: CT HEAD WITHOUT CONTRAST TECHNIQUE: Contiguous axial images were obtained from the base of the skull through the vertex without intravenous contrast. COMPARISON:  12/23/2017 CT head and MRI head. FINDINGS: Brain: No evidence of acute infarction, hemorrhage, hydrocephalus, extra-axial collection or mass lesion/mass effect. Small chronic lacunar infarcts are present within the bilateral thalami. There are mild chronic microvascular ischemic changes of the brain and volume loss. Very small chronic cortical infarction within the left lateral frontal lobe. Vascular: Calcific atherosclerosis of carotid siphons. No hyperdense vessel identified. Skull: Normal. Negative for fracture or focal lesion. Sinuses/Orbits: No acute finding. Other:  None. ASPECTS [facility] Stroke Program Early CT Score) - Ganglionic level infarction (caudate, lentiform nuclei, internal capsule, insula, M1-M3 cortex): 7 - Supraganglionic infarction (M4-M6 cortex): 3 Total score (0-10 with 10 being normal): 10 IMPRESSION: 1. No acute intracranial abnormality identified.  ASPECTS is 10. 2. Stable chronic microvascular ischemic changes and volume loss of the brain. Stable small chronic infarcts within the thalami and left lateral frontal lobe. These results were called by telephone at the time of interpretation on 04/30/2018 at 8:10 pm to Dr. Conni Slipper , who verbally acknowledged these results. Electronically Signed   By: Kristine Garbe M.D.   On: 04/30/2018 20:10   Patient seen and examined.  Clinical course and management discussed.  Necessary edits performed.  I agree with the above.  Assessment and plan of care developed and discussed below.    Assessment: 84 y.o. male presenting with dizziness, diplopia and unsteadiness.  Concern was for an acute infarct.  Patient received tPA and is currently stable.  Continues to have diplopia.  Previously on ASA at home.  Had a previous stroke after being taken off ASA.  MRI of the brain reviewed and shows an acute right midbrain infarct.  Remote bilateral thalamic infarcts noted as well.  MRA only remarkable for 50% right vertebral stenosis.  Suspect small vessel disease as etiology.  Echocardiogram is pending.  A1c 6.0, LDL 58.    Stroke Risk Factors - hyperlipidemia, hypertension and prior stroke  Plan:  1. Will repeat head CT in 24 hours s/p IV Alteplase per protocol.  If no evidence of hemorrhage pateint to start ASA 81mg  and Plavix 75mg  daily.   2. Vital signs/blood pressure with neuro checks/NIHSS  per tPA protocol for the first 24 hours after receiving IV  3. Allow permissive hypertension. If SBP > 180 or DBP > 105 on 2 consecutive checks, then administer PRN IV anti-hypertensive medication as ordered and  notify MD. 4. No anti-platelets or Lovenox for 24 hours s/p IV Alteplase per protocol. 5. Place SCDs for DVT prevention. 6. Obtain STAT head CT for any new acute headache or new neurological deficits 7. PT consult, OT consult, Speech consult 8. Echocardiogram with bubble study pending 9. NPO until RN stroke swallow screen 10.Telemetry monitoring   This patient  was staffed with Dr. Magda Paganini, Doy Mince who personally evaluated patient, reviewed documentation and agreed with assessment and plan of care as above.  Rufina Falco, DNP, FNP-BC Board certified Nurse Practitioner Neurology Department  05/01/2018, 9:19 AM  Alexis Goodell, MD Neurology [phone]  05/01/2018  1:30 PM

## 2018-05-02 LAB — BASIC METABOLIC PANEL
Anion gap: 8 (ref 5–15)
BUN: 31 mg/dL — AB (ref 8–23)
CHLORIDE: 101 mmol/L (ref 98–111)
CO2: 28 mmol/L (ref 22–32)
CREATININE: 1.2 mg/dL (ref 0.61–1.24)
Calcium: 9.1 mg/dL (ref 8.9–10.3)
GFR calc Af Amer: >60 mL/min (ref >60)
GFR calc non Af Amer: 53 mL/min — ABNORMAL LOW (ref >60)
Glucose, Bld: 107 mg/dL — ABNORMAL HIGH (ref 70–99)
Potassium: 4 mmol/L (ref 3.5–5.1)
SODIUM: 137 mmol/L (ref 135–145)

## 2018-05-02 LAB — CBC
HEMATOCRIT: 45.5 % (ref 39.0–52.0)
HEMOGLOBIN: 15.5 g/dL (ref 13.0–17.0)
MCH: 32.6 pg (ref 26.0–34.0)
MCHC: 34.1 g/dL (ref 30.0–36.0)
MCV: 95.8 fL (ref 80.0–100.0)
PLATELETS: 147 10*3/uL — AB (ref 150–400)
RBC: 4.75 MIL/uL (ref 4.22–5.81)
RDW: 12.6 % (ref 11.5–15.5)
WBC: 8.1 10*3/uL (ref 4.0–10.5)
nRBC: 0 % (ref 0.0–0.2)

## 2018-05-02 LAB — MAGNESIUM: MAGNESIUM: 2.2 mg/dL (ref 1.7–2.4)

## 2018-05-02 LAB — ECHOCARDIOGRAM COMPLETE
Height: 70 in
Weight: 2800 oz

## 2018-05-02 MED ORDER — CLOPIDOGREL BISULFATE 75 MG PO TABS
75.0000 mg | ORAL_TABLET | Freq: Every day | ORAL | Status: DC
  Administered 2018-05-02 – 2018-05-03 (×2): 75 mg via ORAL
  Filled 2018-05-02 (×2): qty 1

## 2018-05-02 MED ORDER — ASPIRIN EC 81 MG PO TBEC
81.0000 mg | DELAYED_RELEASE_TABLET | Freq: Every day | ORAL | Status: DC
  Administered 2018-05-02 – 2018-05-03 (×2): 81 mg via ORAL
  Filled 2018-05-02 (×2): qty 1

## 2018-05-02 MED ORDER — LISINOPRIL 20 MG PO TABS
20.0000 mg | ORAL_TABLET | Freq: Every day | ORAL | Status: DC
  Administered 2018-05-02 – 2018-05-03 (×2): 20 mg via ORAL
  Filled 2018-05-02 (×2): qty 1

## 2018-05-02 NOTE — Evaluation (Addendum)
Occupational Therapy Evaluation Patient Details Name: Ronald Haney MRN: [id number] DOB: 22-Dec-1930 Today's Date: 05/02/2018    History of Present Illness 84yo male pt admitted with positive CVA work up and s/p TPA. PMHx includes GERD, HLD, HTN, hx skin cancer, and CVA in June 2019.    Clinical Impression   Pt seen for OT evaluation this date. Pt lives with his spouse in a 1 story home with 2+1 steps to enter with handrails, walk-in shower. PTA, pt was independent and active, walking daily and enjoying woodworking in his shop. Pt was independent with mobility, ADL, and IADL prior to admission with no falls reported in past 12 months. Pt currently presents with visual impairments including intermittent diplopia and decreased smoothness in visual tracking and unable to hold eyes out of midline. No cognitive, coordination, sensory, or strength deficits appreciated upon assessment. Pt noted with very mild word finding difficulties but both pt and son agree that this has been his baseline since his previous stroke in June. Pt able to read out loud from dining menu without difficulty and able to visually scan to identify items/objects in the room without difficulty. Pt able to perform basic ADL tasks with supervision level assist. Mild balance deficits and pt reporting mild dizziness t/o session which did not worsen or improve with positioning changes. Pt able to perform bed mobility with modified independent, CGA for transfers and ambulating in room w/ and w/o a RW. No LOB noted. VSS t/o session. Pt/son educated in falls prevention strategies. Pt will benefit from additional skilled OP OT services specific to low vision rehab to support return to driving and other higher level IADL tasks, such as woodworking. No additional acute OT needs at this time. Will sign off. Please re-consult if additional needs arise during this admission.    Follow Up Recommendations  Other (comment)(OP OT specific for low vision)     Equipment Recommendations  None recommended by OT    Recommendations for Other Services       Precautions / Restrictions Precautions Precautions: Fall Restrictions Weight Bearing Restrictions: No      Mobility Bed Mobility Overal bed mobility: Modified Independent                Transfers Overall transfer level: Needs assistance Equipment used: None Transfers: Sit to/from Stand Sit to Stand: Min guard         General transfer comment: no LOB noted, good safety awareness    Balance Overall balance assessment: Mild deficits observed, not formally tested                                         ADL either performed or assessed with clinical judgement   ADL Overall ADL's : Modified independent                                       General ADL Comments: Pt able to perform all basic ADL tasks near baseline independence. Required CGA for mobility but no physical assist for ADL     Vision Baseline Vision/History: Wears glasses Wears Glasses: Reading only Patient Visual Report: Diplopia(intermittent) Vision Assessment?: Yes Eye Alignment: Within Functional Limits Ocular Range of Motion: Within Functional Limits Alignment/Gaze Preference: Within Defined Limits Tracking/Visual Pursuits: Unable to hold eye position out of midline;Requires cues, head  turns, or add eye shifts to track;Decreased smoothness of horizontal tracking;Impaired - to be further tested in functional context Convergence: Impaired - to be further tested in functional context Visual Fields: No apparent deficits Diplopia Assessment: Other (comment)(intermittent diplopia occuring when pt "moves my eyes too quickly")     Perception     Praxis      Pertinent Vitals/Pain Pain Assessment: No/denies pain     Hand Dominance Right   Extremity/Trunk Assessment Upper Extremity Assessment Upper Extremity Assessment: Overall WFL for tasks assessed(no focal  weakness or coordination/sensory deficits noted )   Lower Extremity Assessment Lower Extremity Assessment: Overall WFL for tasks assessed(no focal weakness or coordination/sensory deficits noted)   Cervical / Trunk Assessment Cervical / Trunk Assessment: Normal   Communication Communication Communication: No difficulties;Other (comment)(mild word finding deficits since previous CVA)   Cognition Arousal/Alertness: Awake/alert Behavior During Therapy: WFL for tasks assessed/performed Overall Cognitive Status: Within Functional Limits for tasks assessed                                 General Comments: alert and oriented, follows all commands well   General Comments       Exercises Other Exercises Other Exercises: pt/family instructed in falls prevention strategies   Shoulder Instructions      Home Living Family/patient expects to be discharged to:: Private residence Living Arrangements: Spouse/significant other Available Help at Discharge: Family;Available 24 hours/day Type of Home: House Home Access: Stairs to enter [employer] of Steps: 2 +1 Entrance Stairs-Rails: Right;Left Home Layout: One level     Bathroom Shower/Tub: Occupational psychologist: Handicapped height Bathroom Accessibility: Yes   Home Equipment: Other (comment);Hand held shower head(walking sticks)          Prior Functioning/Environment Level of Independence: Independent        Comments: Independent at baseline, no AD, community ambulator, drives. Active - walks daily and enjoys woodworking in his shop        OT Problem List: Impaired vision/perception;Impaired balance (sitting and/or standing)      OT Treatment/Interventions:      OT Goals(Current goals can be found in the care plan section) Acute Rehab OT Goals Patient Stated Goal: to get back home and get back to woodworking OT Goal Formulation: All assessment and education complete, DC therapy  OT  Frequency:     Barriers to D/C:            Co-evaluation              AM-PAC PT "6 Clicks" Daily Activity     Outcome Measure Help from another person eating meals?: None Help from another person taking care of personal grooming?: None Help from another person toileting, which includes using toliet, bedpan, or urinal?: A Little Help from another person bathing (including washing, rinsing, drying)?: A Little Help from another person to put on and taking off regular upper body clothing?: None Help from another person to put on and taking off regular lower body clothing?: A Little 6 Click Score: 21   End of Session Equipment Utilized During Treatment: Gait belt;Rolling walker  Activity Tolerance: Patient tolerated treatment well Patient left: in chair;with call bell/phone within reach;with family/visitor present;with SCD's reapplied  OT Visit Diagnosis: Dizziness and giddiness (R42);Other abnormalities of gait and mobility (R26.89);Low vision, both eyes (H54.2)                Time:  0821-0855 OT Time Calculation (min): 34 min Charges:  OT General Charges $OT Visit: 1 Visit OT Evaluation $OT Eval Low Complexity: 1 Low OT Treatments $Self Care/Home Management : 8-22 mins  Jeni Salles, MPH, MS, OTR/L ascom [phone] 05/02/18, 11:05 AM

## 2018-05-02 NOTE — Progress Notes (Signed)
Follow up - Critical Care Medicine Note  Patient Details:    Ronald Haney is an 84 y.o. male.with a PMH of CVA, Skin Cancer, HTN, Hyperlipidemia, and GERD. He presented to [facility] ER via [place] 11/4 with c/o double vision worse on the leftand lightheadedness 1hr prior to presentation to the ER.When EMS arrived at pts home he vomited x2 requiring 4 mg of zofran, vss, and stroke screening negative at that time.Per ER notes upon arrival to the ER pt c/odiplopia and dizziness code stroke initiated. EKG revealed normal sinus rhythm, minimal ST elevation inferior leads pt denied chest pain and CXR negative. Head CT concerning for ischemic stroke (possible posterior circulation infarct) ASPECTS 10. Pt deemed TPA candidate per tele-neurology, therefore TPA administered in the ER resulting in significant improvement fo pts symptomspost TPA. He was subsequently admitted to ICU by hospitalist team for additional workup and treatment.   Lines, Airways, Drains:    Anti-infectives:  Anti-infectives (From admission, onward)   None      Microbiology: Results for orders placed or performed during the hospital encounter of 04/30/18  MRSA PCR Screening     Status: None   Collection Time: 05/01/18  3:11 AM  Result Value Ref Range Status   MRSA by PCR NEGATIVE NEGATIVE Final    Comment:        The GeneXpert MRSA Assay (FDA approved for NASAL specimens only), is one component of a comprehensive MRSA colonization surveillance program. It is not intended to diagnose MRSA infection nor to guide or monitor treatment for MRSA infections. Performed at [facility], [address] [place], [place] [address]     Studies: Ct Head Wo Contrast  Result Date: 05/01/2018 CLINICAL DATA:  84 y/o  M; follow-up of acute stroke. EXAM: CT HEAD WITHOUT CONTRAST TECHNIQUE: Contiguous axial images were obtained from the base of the skull through the vertex without intravenous contrast. COMPARISON:   05/01/2018 MRI head. FINDINGS: Brain: Subcentimeter lucency within the right paramedian midbrain corresponding to infarct on prior MRI of the head. No new acute stroke, hemorrhage, focal mass effect, extra-axial collection, hydrocephalus, or herniation. Stable mild chronic microvascular ischemic changes and volume loss of the brain. Vascular: Calcific atherosclerosis of the carotid siphons and vertebral arteries. No hyperdense vessel identified. Skull: Normal. Negative for fracture or focal lesion. Sinuses/Orbits: No acute finding. Other: None. IMPRESSION: 1. Stable right paramedian midbrain acute infarction given differences in technique. 2. No new acute intracranial abnormality identified. 3. Stable mild chronic microvascular ischemic changes and volume loss of the brain. Electronically Signed   By: Kristine Garbe M.D.   On: 05/01/2018 22:30   Ct Head Wo Contrast  Result Date: 04/30/2018 CLINICAL DATA:  Droopy eyelid after tPA EXAM: CT HEAD WITHOUT CONTRAST TECHNIQUE: Contiguous axial images were obtained from the base of the skull through the vertex without intravenous contrast. COMPARISON:  04/30/2018, 12/23/2017 FINDINGS: Brain: No acute territorial infarction, hemorrhage or intracranial mass. Stable atrophy and small vessel ischemic changes of the white matter. Chronic lacunar infarcts within the thalamus. Small chronic infarct in the left lateral frontal lobe. Vascular: No hyperdense vessels. Carotid vascular calcification. Vertebral artery calcification. Skull: No fracture or suspicious lesion. Sinuses/Orbits: No acute finding. Other: None IMPRESSION: 1. No CT evidence for acute intracranial abnormality. No significant change as compared with CT performed earlier today. 2. Atrophy and small vessel ischemic changes of the white matter. Chronic lacunar infarcts within the thalamus Electronically Signed   By: Donavan Foil M.D.   On:  04/30/2018 23:24   Mr Jodene Nam Neck W Wo Contrast  Result Date:  05/01/2018 CLINICAL DATA:  Visual disturbance with double vision and dizziness. Improved symptoms after tPA EXAM: MR HEAD WITHOUT CONTRAST MR CIRCLE OF WILLIS WITHOUT CONTRAST MRA OF THE NECK WITHOUT AND WITH CONTRAST TECHNIQUE: Multiplanar, multiecho pulse sequences of the brain, circle of willis and surrounding structures were obtained without intravenous contrast. Angiographic images of the neck were obtained using MRA technique without and with intravenous contrast. CONTRAST:  7.5 cc Gadavist intravenous COMPARISON:  12/23/2017 FINDINGS: MR HEAD FINDINGS Brain: 7 mm acute infarct in the midbrain, just anterior and right para median to the cerebral aqua duct. Remote lacunar infarcts in the bilateral medial thalamus. Small remote left frontal cortex infarcts seen on FLAIR imaging image 36. No acute hemorrhage, hydrocephalus, or masslike finding. Vascular: Arterial findings described below. Skull and upper cervical spine: Scalp scarring at the vertex. Negative for marrow lesion Sinuses/Orbits: Bilateral cataract resection. MR CIRCLE OF WILLIS FINDINGS Lower V4 segments are incompletely covered but are seen on neck MRA. The left vertebral artery is dominant. Bilateral PICA patency. Small basilar in the setting of hypoplastic P1 segments, especially on the right. No branch occlusion, beading, or aneurysm. There is mild atheromatous irregularity of the basilar. MRA NECK FINDINGS Time-of-flight shows antegrade flow in both carotid and vertebral circulations. Postcontrast imaging shows the arch which is normal diameter with 3 vessel branching. There is atheromatous luminal irregularity at the carotid bifurcations without flow limiting stenosis or ulceration. Proximal subclaviansare widely patent. There is moderate narrowing at the origin the right vertebral artery measured at 50%. Mild atheromatous irregularity of the right V4 segment. IMPRESSION: Brain MRI: 1. Acute lacunar infarct in the right para median midbrain. 2.  Remote lacunar infarcts in the bilateral medial thalamus. Intracranial MRA: 1. No emergent finding. 2. Atherosclerosis without flow limiting stenosis. Neck MRA: 1. 50% narrowing at the origin of the non dominant right vertebral artery. 2. Cervical carotid atherosclerosis without flow limiting stenosis. Electronically Signed   By: Monte Fantasia M.D.   On: 05/01/2018 10:25   Mr Brain Wo Contrast  Result Date: 05/01/2018 CLINICAL DATA:  Visual disturbance with double vision and dizziness. Improved symptoms after tPA EXAM: MR HEAD WITHOUT CONTRAST MR CIRCLE OF WILLIS WITHOUT CONTRAST MRA OF THE NECK WITHOUT AND WITH CONTRAST TECHNIQUE: Multiplanar, multiecho pulse sequences of the brain, circle of willis and surrounding structures were obtained without intravenous contrast. Angiographic images of the neck were obtained using MRA technique without and with intravenous contrast. CONTRAST:  7.5 cc Gadavist intravenous COMPARISON:  12/23/2017 FINDINGS: MR HEAD FINDINGS Brain: 7 mm acute infarct in the midbrain, just anterior and right para median to the cerebral aqua duct. Remote lacunar infarcts in the bilateral medial thalamus. Small remote left frontal cortex infarcts seen on FLAIR imaging image 36. No acute hemorrhage, hydrocephalus, or masslike finding. Vascular: Arterial findings described below. Skull and upper cervical spine: Scalp scarring at the vertex. Negative for marrow lesion Sinuses/Orbits: Bilateral cataract resection. MR CIRCLE OF WILLIS FINDINGS Lower V4 segments are incompletely covered but are seen on neck MRA. The left vertebral artery is dominant. Bilateral PICA patency. Small basilar in the setting of hypoplastic P1 segments, especially on the right. No branch occlusion, beading, or aneurysm. There is mild atheromatous irregularity of the basilar. MRA NECK FINDINGS Time-of-flight shows antegrade flow in both carotid and vertebral circulations. Postcontrast imaging shows the arch which is normal  diameter with 3 vessel branching. There is atheromatous luminal irregularity  at the carotid bifurcations without flow limiting stenosis or ulceration. Proximal subclaviansare widely patent. There is moderate narrowing at the origin the right vertebral artery measured at 50%. Mild atheromatous irregularity of the right V4 segment. IMPRESSION: Brain MRI: 1. Acute lacunar infarct in the right para median midbrain. 2. Remote lacunar infarcts in the bilateral medial thalamus. Intracranial MRA: 1. No emergent finding. 2. Atherosclerosis without flow limiting stenosis. Neck MRA: 1. 50% narrowing at the origin of the non dominant right vertebral artery. 2. Cervical carotid atherosclerosis without flow limiting stenosis. Electronically Signed   By: Monte Fantasia M.D.   On: 05/01/2018 10:25   Dg Chest Portable 1 View  Result Date: 04/30/2018 CLINICAL DATA:  Dizziness. EXAM: PORTABLE CHEST 1 VIEW COMPARISON:  09/30/2015 FINDINGS: The cardiomediastinal silhouette is within normal limits. The lungs are less well inflated than on the prior study with minimal atelectasis in the lung bases. No edema, pleural effusion, or pneumothorax is identified. No acute osseous abnormality is seen. IMPRESSION: No active disease. Electronically Signed   By: Logan Bores M.D.   On: 04/30/2018 20:01   Mr Jodene Nam Head Wo Contrast  Result Date: 05/01/2018 CLINICAL DATA:  Visual disturbance with double vision and dizziness. Improved symptoms after tPA EXAM: MR HEAD WITHOUT CONTRAST MR CIRCLE OF WILLIS WITHOUT CONTRAST MRA OF THE NECK WITHOUT AND WITH CONTRAST TECHNIQUE: Multiplanar, multiecho pulse sequences of the brain, circle of willis and surrounding structures were obtained without intravenous contrast. Angiographic images of the neck were obtained using MRA technique without and with intravenous contrast. CONTRAST:  7.5 cc Gadavist intravenous COMPARISON:  12/23/2017 FINDINGS: MR HEAD FINDINGS Brain: 7 mm acute infarct in the midbrain,  just anterior and right para median to the cerebral aqua duct. Remote lacunar infarcts in the bilateral medial thalamus. Small remote left frontal cortex infarcts seen on FLAIR imaging image 36. No acute hemorrhage, hydrocephalus, or masslike finding. Vascular: Arterial findings described below. Skull and upper cervical spine: Scalp scarring at the vertex. Negative for marrow lesion Sinuses/Orbits: Bilateral cataract resection. MR CIRCLE OF WILLIS FINDINGS Lower V4 segments are incompletely covered but are seen on neck MRA. The left vertebral artery is dominant. Bilateral PICA patency. Small basilar in the setting of hypoplastic P1 segments, especially on the right. No branch occlusion, beading, or aneurysm. There is mild atheromatous irregularity of the basilar. MRA NECK FINDINGS Time-of-flight shows antegrade flow in both carotid and vertebral circulations. Postcontrast imaging shows the arch which is normal diameter with 3 vessel branching. There is atheromatous luminal irregularity at the carotid bifurcations without flow limiting stenosis or ulceration. Proximal subclaviansare widely patent. There is moderate narrowing at the origin the right vertebral artery measured at 50%. Mild atheromatous irregularity of the right V4 segment. IMPRESSION: Brain MRI: 1. Acute lacunar infarct in the right para median midbrain. 2. Remote lacunar infarcts in the bilateral medial thalamus. Intracranial MRA: 1. No emergent finding. 2. Atherosclerosis without flow limiting stenosis. Neck MRA: 1. 50% narrowing at the origin of the non dominant right vertebral artery. 2. Cervical carotid atherosclerosis without flow limiting stenosis. Electronically Signed   By: Monte Fantasia M.D.   On: 05/01/2018 10:25   Ct Head Code Stroke Wo Contrast  Result Date: 04/30/2018 CLINICAL DATA:  Code stroke. 84 y/o M; dizziness and blurred vision for 1 hour. History of stroke. EXAM: CT HEAD WITHOUT CONTRAST TECHNIQUE: Contiguous axial images  were obtained from the base of the skull through the vertex without intravenous contrast. COMPARISON:  12/23/2017 CT head and  MRI head. FINDINGS: Brain: No evidence of acute infarction, hemorrhage, hydrocephalus, extra-axial collection or mass lesion/mass effect. Small chronic lacunar infarcts are present within the bilateral thalami. There are mild chronic microvascular ischemic changes of the brain and volume loss. Very small chronic cortical infarction within the left lateral frontal lobe. Vascular: Calcific atherosclerosis of carotid siphons. No hyperdense vessel identified. Skull: Normal. Negative for fracture or focal lesion. Sinuses/Orbits: No acute finding. Other: None. ASPECTS [facility] Stroke Program Early CT Score) - Ganglionic level infarction (caudate, lentiform nuclei, internal capsule, insula, M1-M3 cortex): 7 - Supraganglionic infarction (M4-M6 cortex): 3 Total score (0-10 with 10 being normal): 10 IMPRESSION: 1. No acute intracranial abnormality identified.  ASPECTS is 10. 2. Stable chronic microvascular ischemic changes and volume loss of the brain. Stable small chronic infarcts within the thalami and left lateral frontal lobe. These results were called by telephone at the time of interpretation on 04/30/2018 at 8:10 pm to Dr. Conni Slipper , who verbally acknowledged these results. Electronically Signed   By: Kristine Garbe M.D.   On: 04/30/2018 20:10    Consults: Treatment Team:  Pccm, Ander Gaster, MD Alexis Goodell, MD   Subjective:    Overnight Issues: No difficulties overnight  Objective:  Vital signs for last 24 hours: Temp:  [97.7 F (36.5 C)-97.9 F (36.6 C)] 97.8 F (36.6 C) (11/06 0200) Pulse Rate:  [58-75] 58 (11/06 0300) Resp:  [14-23] 16 (11/06 0300) BP: (125-156)/(64-76) 136/65 (11/06 0300) SpO2:  [92 %-97 %] 94 % (11/06 0300)  Hemodynamic parameters for last 24 hours:    Intake/Output from previous day: 11/05 0701 - 11/06 0700 In: 580 [P.O.:480;  IV Piggyback:100] Out: 750 [Urine:750]  Intake/Output this shift: No intake/output data recorded.  Vent settings for last 24 hours:    Physical Exam:  Patient is awake alert and in no acute distress HEENT: No oral lesions, trachea midline, no jugular venous distention Cardiovascular: Regular rate and rhythm Pulmonary: Clear to auscultation Abdominal: Positive bowel sounds, soft exam Extremities: No clubbing cyanosis or edema noted Neurologic: No focal deficits appreciated, states the majority of his double vision has resolved  Assessment/Plan:   Status post CVA.  Please see MRI and CT scan reports.  Being followed by neurology, status post TPA, no evidence of bleeding H&H is stable.  At this point patient is stable for floor transfer neurologic follow-up  Lenvil Swaim, DO  Chrisann Melaragno,[place] 05/02/2018  *Care during the described time interval was provided by me and/or other providers on the critical care team.  I have reviewed this patient's available data, including medical history, events of note, physical examination and test results as part of my evaluation.

## 2018-05-02 NOTE — Progress Notes (Addendum)
Subjective: Patient up with PT this morning.  Dizziness and vision disturbances continues to improve.  He reports episode of dizziness ambulating with PT today however is not as severe he first presented.  Denies other associated symptoms of nausea or vomiting, gait instability or headache.  Patient's son is at the bedside.  Objective: Current vital signs: BP 136/65   Pulse (!) 58   Temp 97.8 F (36.6 C) (Oral)   Resp 16   Ht 5\' 10"  (1.778 m)   Wt 79.4 kg   SpO2 94%   BMI 25.11 kg/m  Vital signs in last 24 hours: Temp:  [97.7 F (36.5 C)-97.9 F (36.6 C)] 97.8 F (36.6 C) (11/06 0200) Pulse Rate:  [58-75] 58 (11/06 0300) Resp:  [14-23] 16 (11/06 0300) BP: (125-156)/(64-76) 136/65 (11/06 0300) SpO2:  [92 %-97 %] 94 % (11/06 0300)  Intake/Output from previous day: 11/05 0701 - 11/06 0700 In: 580 [P.O.:480; IV Piggyback:100] Out: 750 [Urine:750] Intake/Output this shift: No intake/output data recorded. Nutritional status:  Diet Order            Diet Heart Room service appropriate? Yes; Fluid consistency: Thin  Diet effective now             Neurologic Exam:  Mental Status: Alert, oriented, thought content appropriate. Speech fluent without evidence of aphasia. Able to follow 3 step commands without difficulty. Attention span and concentration seemed appropriate  Cranial Nerves: II: Discs flat bilaterally; Visual fields grossly normal, pupils equal, round, reactive to light and accommodation III,IV, VI: ptosis not present, extra-ocular motions improved with some continued limitation noted on medial gaze with the right eye V,VII: smile symmetric, facial light touch sensationintact VIII: hearing normal bilaterally IX,X: gag reflex present XI: bilateral shoulder shrug XII: midline tongue extension Motor: Right :Upper extremity 5/5Without pronator driftLeft: Upper extremity 5/5 without pronator drift Right:Lower extremity  5/5Left: Lower extremity 5/5 Tone and bulk:normal tone throughout; no atrophy noted Sensory: Pinprick and light touchintact bilaterally Deep Tendon Reflexes: 2+ and symmetric throughout Plantars: Right:muteLeft: mute Cerebellar: Finger-to-nosetesting intact bilaterally.Heel to shin testing normal bilaterally Gait: Steady when observed ambulating with PT  Lab Results: Basic Metabolic Panel: Recent Labs  Lab 04/30/18 1936 05/01/18 0643 05/01/18 1409 05/02/18 0512  NA 134* 135  --  137  K 3.6 3.7  --  4.0  CL 101 102  --  101  CO2 25 24  --  28  GLUCOSE 133* 124*  --  107*  BUN 32* 31*  --  31*  CREATININE 1.14 1.03  --  1.20  CALCIUM 9.0 8.9  --  9.1  MG  --   --  1.9 2.2    Liver Function Tests: Recent Labs  Lab 04/30/18 1936  AST 23  ALT 19  ALKPHOS 73  BILITOT 0.8  PROT 7.1  ALBUMIN 4.1   No results for input(s): LIPASE, AMYLASE in the last 168 hours. No results for input(s): AMMONIA in the last 168 hours.  CBC: Recent Labs  Lab 04/30/18 1936 05/01/18 0643 05/02/18 0512  WBC 7.4 10.2 8.1  NEUTROABS 4.0 6.7  --   HGB 14.9 17.0 15.5  HCT 44.2 49.3 45.5  MCV 96.5 95.9 95.8  PLT 142* 162 147*    Cardiac Enzymes: Recent Labs  Lab 04/30/18 1936  TROPONINI <0.03    Lipid Panel: Recent Labs  Lab 05/01/18 0643  CHOL 101  TRIG 42  HDL 35*  CHOLHDL 2.9  VLDL 8  LDLCALC 58  CBG: Recent Labs  Lab 04/30/18 1949 05/01/18 0251  GLUCAP 112* 109*    Microbiology: Results for orders placed or performed during the hospital encounter of 04/30/18  MRSA PCR Screening     Status: None   Collection Time: 05/01/18  3:11 AM  Result Value Ref Range Status   MRSA by PCR NEGATIVE NEGATIVE Final    Comment:        The GeneXpert MRSA Assay (FDA approved for NASAL specimens only), is one component of a comprehensive MRSA colonization surveillance program. It is  not intended to diagnose MRSA infection nor to guide or monitor treatment for MRSA infections. Performed at [facility], [address] [place], [place] [address]     Coagulation Studies: Recent Labs    04/30/18 1946  LABPROT 12.9  INR 0.98    Imaging: Ct Head Wo Contrast  Result Date: 05/01/2018 CLINICAL DATA:  84 y/o  M; follow-up of acute stroke. EXAM: CT HEAD WITHOUT CONTRAST TECHNIQUE: Contiguous axial images were obtained from the base of the skull through the vertex without intravenous contrast. COMPARISON:  05/01/2018 MRI head. FINDINGS: Brain: Subcentimeter lucency within the right paramedian midbrain corresponding to infarct on prior MRI of the head. No new acute stroke, hemorrhage, focal mass effect, extra-axial collection, hydrocephalus, or herniation. Stable mild chronic microvascular ischemic changes and volume loss of the brain. Vascular: Calcific atherosclerosis of the carotid siphons and vertebral arteries. No hyperdense vessel identified. Skull: Normal. Negative for fracture or focal lesion. Sinuses/Orbits: No acute finding. Other: None. IMPRESSION: 1. Stable right paramedian midbrain acute infarction given differences in technique. 2. No new acute intracranial abnormality identified. 3. Stable mild chronic microvascular ischemic changes and volume loss of the brain. Electronically Signed   By: Kristine Garbe M.D.   On: 05/01/2018 22:30   Ct Head Wo Contrast  Result Date: 04/30/2018 CLINICAL DATA:  Droopy eyelid after tPA EXAM: CT HEAD WITHOUT CONTRAST TECHNIQUE: Contiguous axial images were obtained from the base of the skull through the vertex without intravenous contrast. COMPARISON:  04/30/2018, 12/23/2017 FINDINGS: Brain: No acute territorial infarction, hemorrhage or intracranial mass. Stable atrophy and small vessel ischemic changes of the white matter. Chronic lacunar infarcts within the thalamus. Small chronic infarct in the left lateral  frontal lobe. Vascular: No hyperdense vessels. Carotid vascular calcification. Vertebral artery calcification. Skull: No fracture or suspicious lesion. Sinuses/Orbits: No acute finding. Other: None IMPRESSION: 1. No CT evidence for acute intracranial abnormality. No significant change as compared with CT performed earlier today. 2. Atrophy and small vessel ischemic changes of the white matter. Chronic lacunar infarcts within the thalamus Electronically Signed   By: Donavan Foil M.D.   On: 04/30/2018 23:24   Mr Jodene Nam Neck W Wo Contrast  Result Date: 05/01/2018 CLINICAL DATA:  Visual disturbance with double vision and dizziness. Improved symptoms after tPA EXAM: MR HEAD WITHOUT CONTRAST MR CIRCLE OF WILLIS WITHOUT CONTRAST MRA OF THE NECK WITHOUT AND WITH CONTRAST TECHNIQUE: Multiplanar, multiecho pulse sequences of the brain, circle of willis and surrounding structures were obtained without intravenous contrast. Angiographic images of the neck were obtained using MRA technique without and with intravenous contrast. CONTRAST:  7.5 cc Gadavist intravenous COMPARISON:  12/23/2017 FINDINGS: MR HEAD FINDINGS Brain: 7 mm acute infarct in the midbrain, just anterior and right para median to the cerebral aqua duct. Remote lacunar infarcts in the bilateral medial thalamus. Small remote left frontal cortex infarcts seen on FLAIR imaging image 36. No acute hemorrhage, hydrocephalus, or masslike finding.  Vascular: Arterial findings described below. Skull and upper cervical spine: Scalp scarring at the vertex. Negative for marrow lesion Sinuses/Orbits: Bilateral cataract resection. MR CIRCLE OF WILLIS FINDINGS Lower V4 segments are incompletely covered but are seen on neck MRA. The left vertebral artery is dominant. Bilateral PICA patency. Small basilar in the setting of hypoplastic P1 segments, especially on the right. No branch occlusion, beading, or aneurysm. There is mild atheromatous irregularity of the basilar. MRA NECK  FINDINGS Time-of-flight shows antegrade flow in both carotid and vertebral circulations. Postcontrast imaging shows the arch which is normal diameter with 3 vessel branching. There is atheromatous luminal irregularity at the carotid bifurcations without flow limiting stenosis or ulceration. Proximal subclaviansare widely patent. There is moderate narrowing at the origin the right vertebral artery measured at 50%. Mild atheromatous irregularity of the right V4 segment. IMPRESSION: Brain MRI: 1. Acute lacunar infarct in the right para median midbrain. 2. Remote lacunar infarcts in the bilateral medial thalamus. Intracranial MRA: 1. No emergent finding. 2. Atherosclerosis without flow limiting stenosis. Neck MRA: 1. 50% narrowing at the origin of the non dominant right vertebral artery. 2. Cervical carotid atherosclerosis without flow limiting stenosis. Electronically Signed   By: Monte Fantasia M.D.   On: 05/01/2018 10:25   Mr Brain Wo Contrast  Result Date: 05/01/2018 CLINICAL DATA:  Visual disturbance with double vision and dizziness. Improved symptoms after tPA EXAM: MR HEAD WITHOUT CONTRAST MR CIRCLE OF WILLIS WITHOUT CONTRAST MRA OF THE NECK WITHOUT AND WITH CONTRAST TECHNIQUE: Multiplanar, multiecho pulse sequences of the brain, circle of willis and surrounding structures were obtained without intravenous contrast. Angiographic images of the neck were obtained using MRA technique without and with intravenous contrast. CONTRAST:  7.5 cc Gadavist intravenous COMPARISON:  12/23/2017 FINDINGS: MR HEAD FINDINGS Brain: 7 mm acute infarct in the midbrain, just anterior and right para median to the cerebral aqua duct. Remote lacunar infarcts in the bilateral medial thalamus. Small remote left frontal cortex infarcts seen on FLAIR imaging image 36. No acute hemorrhage, hydrocephalus, or masslike finding. Vascular: Arterial findings described below. Skull and upper cervical spine: Scalp scarring at the vertex.  Negative for marrow lesion Sinuses/Orbits: Bilateral cataract resection. MR CIRCLE OF WILLIS FINDINGS Lower V4 segments are incompletely covered but are seen on neck MRA. The left vertebral artery is dominant. Bilateral PICA patency. Small basilar in the setting of hypoplastic P1 segments, especially on the right. No branch occlusion, beading, or aneurysm. There is mild atheromatous irregularity of the basilar. MRA NECK FINDINGS Time-of-flight shows antegrade flow in both carotid and vertebral circulations. Postcontrast imaging shows the arch which is normal diameter with 3 vessel branching. There is atheromatous luminal irregularity at the carotid bifurcations without flow limiting stenosis or ulceration. Proximal subclaviansare widely patent. There is moderate narrowing at the origin the right vertebral artery measured at 50%. Mild atheromatous irregularity of the right V4 segment. IMPRESSION: Brain MRI: 1. Acute lacunar infarct in the right para median midbrain. 2. Remote lacunar infarcts in the bilateral medial thalamus. Intracranial MRA: 1. No emergent finding. 2. Atherosclerosis without flow limiting stenosis. Neck MRA: 1. 50% narrowing at the origin of the non dominant right vertebral artery. 2. Cervical carotid atherosclerosis without flow limiting stenosis. Electronically Signed   By: Monte Fantasia M.D.   On: 05/01/2018 10:25   Dg Chest Portable 1 View  Result Date: 04/30/2018 CLINICAL DATA:  Dizziness. EXAM: PORTABLE CHEST 1 VIEW COMPARISON:  09/30/2015 FINDINGS: The cardiomediastinal silhouette is within normal limits. The lungs are  less well inflated than on the prior study with minimal atelectasis in the lung bases. No edema, pleural effusion, or pneumothorax is identified. No acute osseous abnormality is seen. IMPRESSION: No active disease. Electronically Signed   By: Logan Bores M.D.   On: 04/30/2018 20:01   Mr Jodene Nam Head Wo Contrast  Result Date: 05/01/2018 CLINICAL DATA:  Visual disturbance  with double vision and dizziness. Improved symptoms after tPA EXAM: MR HEAD WITHOUT CONTRAST MR CIRCLE OF WILLIS WITHOUT CONTRAST MRA OF THE NECK WITHOUT AND WITH CONTRAST TECHNIQUE: Multiplanar, multiecho pulse sequences of the brain, circle of willis and surrounding structures were obtained without intravenous contrast. Angiographic images of the neck were obtained using MRA technique without and with intravenous contrast. CONTRAST:  7.5 cc Gadavist intravenous COMPARISON:  12/23/2017 FINDINGS: MR HEAD FINDINGS Brain: 7 mm acute infarct in the midbrain, just anterior and right para median to the cerebral aqua duct. Remote lacunar infarcts in the bilateral medial thalamus. Small remote left frontal cortex infarcts seen on FLAIR imaging image 36. No acute hemorrhage, hydrocephalus, or masslike finding. Vascular: Arterial findings described below. Skull and upper cervical spine: Scalp scarring at the vertex. Negative for marrow lesion Sinuses/Orbits: Bilateral cataract resection. MR CIRCLE OF WILLIS FINDINGS Lower V4 segments are incompletely covered but are seen on neck MRA. The left vertebral artery is dominant. Bilateral PICA patency. Small basilar in the setting of hypoplastic P1 segments, especially on the right. No branch occlusion, beading, or aneurysm. There is mild atheromatous irregularity of the basilar. MRA NECK FINDINGS Time-of-flight shows antegrade flow in both carotid and vertebral circulations. Postcontrast imaging shows the arch which is normal diameter with 3 vessel branching. There is atheromatous luminal irregularity at the carotid bifurcations without flow limiting stenosis or ulceration. Proximal subclaviansare widely patent. There is moderate narrowing at the origin the right vertebral artery measured at 50%. Mild atheromatous irregularity of the right V4 segment. IMPRESSION: Brain MRI: 1. Acute lacunar infarct in the right para median midbrain. 2. Remote lacunar infarcts in the bilateral  medial thalamus. Intracranial MRA: 1. No emergent finding. 2. Atherosclerosis without flow limiting stenosis. Neck MRA: 1. 50% narrowing at the origin of the non dominant right vertebral artery. 2. Cervical carotid atherosclerosis without flow limiting stenosis. Electronically Signed   By: Monte Fantasia M.D.   On: 05/01/2018 10:25   Ct Head Code Stroke Wo Contrast  Result Date: 04/30/2018 CLINICAL DATA:  Code stroke. 84 y/o M; dizziness and blurred vision for 1 hour. History of stroke. EXAM: CT HEAD WITHOUT CONTRAST TECHNIQUE: Contiguous axial images were obtained from the base of the skull through the vertex without intravenous contrast. COMPARISON:  12/23/2017 CT head and MRI head. FINDINGS: Brain: No evidence of acute infarction, hemorrhage, hydrocephalus, extra-axial collection or mass lesion/mass effect. Small chronic lacunar infarcts are present within the bilateral thalami. There are mild chronic microvascular ischemic changes of the brain and volume loss. Very small chronic cortical infarction within the left lateral frontal lobe. Vascular: Calcific atherosclerosis of carotid siphons. No hyperdense vessel identified. Skull: Normal. Negative for fracture or focal lesion. Sinuses/Orbits: No acute finding. Other: None. ASPECTS [facility] Stroke Program Early CT Score) - Ganglionic level infarction (caudate, lentiform nuclei, internal capsule, insula, M1-M3 cortex): 7 - Supraganglionic infarction (M4-M6 cortex): 3 Total score (0-10 with 10 being normal): 10 IMPRESSION: 1. No acute intracranial abnormality identified.  ASPECTS is 10. 2. Stable chronic microvascular ischemic changes and volume loss of the brain. Stable small chronic infarcts within the thalami and left  lateral frontal lobe. These results were called by telephone at the time of interpretation on 04/30/2018 at 8:10 pm to Dr. Conni Slipper , who verbally acknowledged these results. Electronically Signed   By: Kristine Garbe M.D.   On:  04/30/2018 20:10    Medications:  I have reviewed the patient's current medications. Prior to Admission:  Medications Prior to Admission  Medication Sig Dispense Refill Last Dose  . aspirin 81 MG chewable tablet Chew 81 mg by mouth daily.    04/30/2018 at am  . atenolol (TENORMIN) 25 MG tablet Take 25 mg by mouth daily.    04/30/2018 at 0630  . atorvastatin (LIPITOR) 40 MG tablet Take 1 tablet (40 mg total) by mouth daily. 30 tablet 11 04/30/2018 at am  . fluticasone (FLONASE) 50 MCG/ACT nasal spray Place 2 sprays into both nostrils daily.   04/30/2018 at am  . gemfibrozil (LOPID) 600 MG tablet Take 600 mg by mouth 2 (two) times daily.   3 04/30/2018 at am  . hydrochlorothiazide (HYDRODIURIL) 25 MG tablet Take 25 mg by mouth daily.   3 04/30/2018 at am  . lisinopril (PRINIVIL,ZESTRIL) 30 MG tablet Take 30 mg by mouth daily.  3 04/30/2018 at am  . omeprazole (PRILOSEC) 20 MG capsule Take 20 mg by mouth daily.  3 04/30/2018 at am   Scheduled: . atorvastatin  40 mg Oral Daily  . pantoprazole  40 mg Oral Daily   Patient seen and examined.  Clinical course and management discussed.  Necessary edits performed.  I agree with the above.  Assessment and plan of care developed and discussed below.    Assessment: 84 y.o. male presenting with dizziness, diplopia and unsteadiness noted to have an acute right midbrain infarct.  Status post IV tPA.  Symptoms continue to improve with no new episodes of stroke or strokelike symptoms.  CT head repeated 24 hours post IV alteplase reviewed and show stable right paramedian midbrain acute infarction with no evidence of hemorrhage.  Hemoglobin A1c 6.0, LDL 88.  Plan:  1. Start dual therapy antiplatelet with Aspirin 81 mg/day and Plavix 75 mg /day 2. Statin for goal low density lipoprotein (LDL) <70 mg/dl 3. Continue therapy 4. Echocardiogram with bubble study pending 5.Telemetry monitoring 6. Will need outpatient follow-up with neurology  This patient was staffed  with Dr. Magda Paganini, Doy Mince who personally evaluated patient, reviewed documentation and agreed with assessment and plan of care as above.  Rufina Falco, DNP, FNP-BC Board certified Nurse Practitioner Neurology Department    LOS: 1 day   05/02/2018  9:33 AM  Alexis Goodell, MD Neurology [phone]  05/02/2018  2:16 PM

## 2018-05-02 NOTE — Evaluation (Addendum)
Physical Therapy Evaluation Patient Details Name: Ronald Haney MRN: [id number] DOB: [birth year] Today's Date: 05/02/2018   History of Present Illness  84yo male pt admitted with positive CVA work up and s/p TPA. PMHx includes GERD, HLD, HTN, hx skin cancer, and CVA in June 2019.   Clinical Impression  Patient A&O x4 at start of session with no complaints of pain, but complaining of intermittent double vision and dizziness. Vitals stable throughout session. Pt reports living in 1 story home with wife, completely independent in ADLs/IADLs/ambulation including driving prior to admission.  Upon assessment patient demonstrated symmetrical LE/UE strength, coordination, light touch sensation. Patient with difficulty of smooth pursuit, saccades, convergence and peripheral vision. Patient demonstrated mobility and ambulation with CGA and RW. Of note, patient exhibited deficits with dynamic balance and while turning with AD, assist from PT needed for 2 LOB. Patient educated on condition, AD management, sequencing of moving, activity pacing to maximize safety. Overall, the patient would benefit from skilled PT intervention to address these changes from PLOF to optimize pt independence, mobility, safety and to return patient to PLOF. Current recommendation in HHPT with supervision/OOB due to balance deficits.    Follow Up Recommendations Supervision for mobility/OOB;Home health PT    Equipment Recommendations  Rolling walker with 5" wheels    Recommendations for Other Services       Precautions / Restrictions Precautions Precautions: Fall Restrictions Weight Bearing Restrictions: No      Mobility  Bed Mobility Overal bed mobility: Modified Independent             General bed mobility comments: deferred, up in chair  Transfers Overall transfer level: Needs assistance Equipment used: Rolling walker (2 wheeled) Transfers: Sit to/from Stand Sit to Stand: Min guard         General  transfer comment: no LOB noted, good safety awareness  Ambulation/Gait   Gait Distance (Feet): 180 Feet Assistive device: Rolling walker (2 wheeled) Gait Pattern/deviations: Step-through pattern Gait velocity: varying velocities throughout ambulation, decreased to start with improvement.    General Gait Details: Patient needs cues for AD management, as well as sequencing for turns, 1 LOB noted with initial turn with walker, minAx1 from PT to correct.  Stairs            Wheelchair Mobility    Modified Rankin (Stroke Patients Only) Modified Rankin (Stroke Patients Only) Pre-Morbid Rankin Score: No symptoms Modified Rankin: No significant disability     Balance Overall balance assessment: Needs assistance Sitting-balance support: Feet supported Sitting balance-Leahy Scale: Good     Standing balance support: No upper extremity supported Standing balance-Leahy Scale: Fair Standing balance comment: Patient demonstrated initial standing balance deficits. Asked to march in place, LOB noted and modAx1 from PT to correct.                              Pertinent Vitals/Pain Pain Assessment: No/denies pain    Home Living Family/patient expects to be discharged to:: Private residence Living Arrangements: Spouse/significant other Available Help at Discharge: Family;Available 24 hours/day Type of Home: House Home Access: Stairs to enter Entrance Stairs-Rails: Psychiatric nurse of Steps: 2 +1 Home Layout: One level Home Equipment: Other (comment);Hand held shower head(walking sticks)      Prior Function Level of Independence: Independent         Comments: Independent at baseline, no AD, community ambulator, drives. Active - walks daily and enjoys woodworking in his shop  Hand Dominance   Dominant Hand: Right    Extremity/Trunk Assessment   Upper Extremity Assessment Upper Extremity Assessment: RUE deficits/detail;LUE  deficits/detail RUE Deficits / Details: slowed coordination, but symmetrical, grossly 4/5 RUE Sensation: WNL RUE Coordination: WNL LUE Deficits / Details: slowed coordination, but symmetrical, grossly 4/5 LUE Sensation: WNL LUE Coordination: WNL    Lower Extremity Assessment Lower Extremity Assessment: RLE deficits/detail;LLE deficits/detail RLE Deficits / Details: grossly 4/5 RLE Sensation: WNL RLE Coordination: WNL LLE Deficits / Details: grossly 4/5 LLE Sensation: WNL LLE Coordination: WNL    Cervical / Trunk Assessment Cervical / Trunk Assessment: Normal  Communication   Communication: No difficulties;Other (comment)(mild word finding deficits since previous CVA)  Cognition Arousal/Alertness: Awake/alert Behavior During Therapy: WFL for tasks assessed/performed Overall Cognitive Status: Within Functional Limits for tasks assessed                                 General Comments: alert and oriented, follows all commands well      General Comments      Exercises Other Exercises Other Exercises: pt/family instructed in falls prevention strategies   Assessment/Plan    PT Assessment Patient needs continued PT services  PT Problem List Decreased strength;Decreased knowledge of use of DME;Decreased activity tolerance;Decreased balance;Decreased knowledge of precautions;Decreased mobility       PT Treatment Interventions DME instruction;Balance training;Gait training;Neuromuscular re-education;Stair training;Functional mobility training;Patient/family education;Therapeutic activities;Therapeutic exercise    PT Goals (Current goals can be found in the Care Plan section)  Acute Rehab PT Goals Patient Stated Goal: to go home, return to PLOF PT Goal Formulation: With patient Time For Goal Achievement: 05/16/18 Potential to Achieve Goals: Good    Frequency 7X/week   Barriers to discharge        Co-evaluation               AM-PAC PT "6 Clicks"  Daily Activity  Outcome Measure Difficulty turning over in bed (including adjusting bedclothes, sheets and blankets)?: None Difficulty moving from lying on back to sitting on the side of the bed? : None Difficulty sitting down on and standing up from a chair with arms (e.g., wheelchair, bedside commode, etc,.)?: Unable Help needed moving to and from a bed to chair (including a wheelchair)?: A Little Help needed walking in hospital room?: A Little Help needed climbing 3-5 steps with a railing? : A Little 6 Click Score: 18    End of Session Equipment Utilized During Treatment: Gait belt Activity Tolerance: Patient tolerated treatment well Patient left: in chair;with call bell/phone within reach Nurse Communication: Mobility status PT Visit Diagnosis: Unsteadiness on feet (R26.81);Other abnormalities of gait and mobility (R26.89);Difficulty in walking, not elsewhere classified (R26.2)    Time: 0109-3235 PT Time Calculation (min) (ACUTE ONLY): 25 min   Charges:   PT Evaluation $PT Eval Low Complexity: 1 Low PT Treatments $Gait Training: 8-22 mins       Lieutenant Diego PT, DPT 11:57 AM,05/02/18 [phone]

## 2018-05-02 NOTE — Progress Notes (Addendum)
Pharmacy Electrolyte Monitoring Consult:  Pharmacy consulted to assist in monitoring and replacing electrolytes in this 84 y.o. male admitted on 04/30/2018 with Stroke s/p TPA administration in the ED.   Labs:  Sodium (mmol/L)  Date Value  05/02/2018 137  06/22/2013 138   Potassium (mmol/L)  Date Value  05/02/2018 4.0  06/22/2013 4.6   Magnesium (mg/dL)  Date Value  05/02/2018 2.2   Calcium (mg/dL)  Date Value  05/02/2018 9.1   Calcium, Total (mg/dL)  Date Value  06/22/2013 9.0   Albumin (g/dL)  Date Value  04/30/2018 4.1    Assessment/Plan: Goals: Potassium ~4.0 and magnesium ~2.0  Electrolyte replacement not warranted at this time.  Pharmacy will sign off.  Paticia Stack, PharmD Pharmacy Resident  05/02/2018 11:50 AM

## 2018-05-02 NOTE — Progress Notes (Signed)
[facility] [place] NAME: Ronald Haney    MR#:  [id number]  DATE OF BIRTH:  [birth year]  SUBJECTIVE:  CHIEF COMPLAINT:   Chief Complaint  Patient presents with  . Dizziness   -Diplopia is much improving.  Patient is status post TPA.  Transfer to floor  REVIEW OF SYSTEMS:  Review of Systems  Constitutional: Negative for chills, fever and malaise/fatigue.  HENT: Negative for congestion, ear discharge, hearing loss and nosebleeds.   Eyes: Positive for blurred vision and double vision.  Respiratory: Negative for cough, shortness of breath and wheezing.   Cardiovascular: Negative for chest pain and palpitations.  Gastrointestinal: Negative for abdominal pain, constipation, diarrhea, nausea and vomiting.  Genitourinary: Negative for dysuria.  Musculoskeletal: Negative for myalgias.  Neurological: Negative for dizziness, focal weakness, seizures, weakness and headaches.  Psychiatric/Behavioral: Negative for depression.    DRUG ALLERGIES:  No Known Allergies  VITALS:  Blood pressure (!) 158/76, pulse (!) 57, temperature 97.9 F (36.6 C), temperature source Oral, resp. rate 20, height 5\' 10"  (1.778 m), weight 78.7 kg, SpO2 98 %.  PHYSICAL EXAMINATION:  Physical Exam   GENERAL:  84 y.o.-year-old patient lying in the bed with no acute distress.  EYES: Pupils equal, round, reactive to light and accommodation. No scleral icterus. Extraocular muscles intact.  HEENT: Head atraumatic, normocephalic. Oropharynx and nasopharynx clear.  NECK:  Supple, no jugular venous distention. No thyroid enlargement, no tenderness.  LUNGS: Normal breath sounds bilaterally, no wheezing, rales,rhonchi or crepitation. No use of accessory muscles of respiration.  CARDIOVASCULAR: S1, S2 normal. No murmurs, rubs, or gallops.  ABDOMEN: Soft, nontender, nondistended. Bowel sounds present. No organomegaly or mass.  EXTREMITIES: No pedal edema, cyanosis, or clubbing.    NEUROLOGIC: Cranial nerves II through XII are intact.  Diplopia still present intermittently.  Muscle strength 5/5 in all extremities. Sensation intact. Gait not checked.  PSYCHIATRIC: The patient is alert and oriented x 3.  SKIN: No obvious rash, lesion, or ulcer.    LABORATORY PANEL:   CBC Recent Labs  Lab 05/02/18 0512  WBC 8.1  HGB 15.5  HCT 45.5  PLT 147*   ------------------------------------------------------------------------------------------------------------------  Chemistries  Recent Labs  Lab 04/30/18 1936  05/02/18 0512  NA 134*   < > 137  K 3.6   < > 4.0  CL 101   < > 101  CO2 25   < > 28  GLUCOSE 133*   < > 107*  BUN 32*   < > 31*  CREATININE 1.14   < > 1.20  CALCIUM 9.0   < > 9.1  MG  --    < > 2.2  AST 23  --   --   ALT 19  --   --   ALKPHOS 73  --   --   BILITOT 0.8  --   --    < > = values in this interval not displayed.   ------------------------------------------------------------------------------------------------------------------  Cardiac Enzymes Recent Labs  Lab 04/30/18 1936  TROPONINI <0.03   ------------------------------------------------------------------------------------------------------------------  RADIOLOGY:  Ct Head Wo Contrast  Result Date: 05/01/2018 CLINICAL DATA:  84 y/o  M; follow-up of acute stroke. EXAM: CT HEAD WITHOUT CONTRAST TECHNIQUE: Contiguous axial images were obtained from the base of the skull through the vertex without intravenous contrast. COMPARISON:  05/01/2018 MRI head. FINDINGS: Brain: Subcentimeter lucency within the right paramedian midbrain corresponding to infarct on prior MRI of the head. No new acute stroke, hemorrhage, focal  mass effect, extra-axial collection, hydrocephalus, or herniation. Stable mild chronic microvascular ischemic changes and volume loss of the brain. Vascular: Calcific atherosclerosis of the carotid siphons and vertebral arteries. No hyperdense vessel identified. Skull:  Normal. Negative for fracture or focal lesion. Sinuses/Orbits: No acute finding. Other: None. IMPRESSION: 1. Stable right paramedian midbrain acute infarction given differences in technique. 2. No new acute intracranial abnormality identified. 3. Stable mild chronic microvascular ischemic changes and volume loss of the brain. Electronically Signed   By: Kristine Garbe M.D.   On: 05/01/2018 22:30   Ct Head Wo Contrast  Result Date: 04/30/2018 CLINICAL DATA:  Droopy eyelid after tPA EXAM: CT HEAD WITHOUT CONTRAST TECHNIQUE: Contiguous axial images were obtained from the base of the skull through the vertex without intravenous contrast. COMPARISON:  04/30/2018, 12/23/2017 FINDINGS: Brain: No acute territorial infarction, hemorrhage or intracranial mass. Stable atrophy and small vessel ischemic changes of the white matter. Chronic lacunar infarcts within the thalamus. Small chronic infarct in the left lateral frontal lobe. Vascular: No hyperdense vessels. Carotid vascular calcification. Vertebral artery calcification. Skull: No fracture or suspicious lesion. Sinuses/Orbits: No acute finding. Other: None IMPRESSION: 1. No CT evidence for acute intracranial abnormality. No significant change as compared with CT performed earlier today. 2. Atrophy and small vessel ischemic changes of the white matter. Chronic lacunar infarcts within the thalamus Electronically Signed   By: Donavan Foil M.D.   On: 04/30/2018 23:24   Mr Jodene Nam Neck W Wo Contrast  Result Date: 05/01/2018 CLINICAL DATA:  Visual disturbance with double vision and dizziness. Improved symptoms after tPA EXAM: MR HEAD WITHOUT CONTRAST MR CIRCLE OF WILLIS WITHOUT CONTRAST MRA OF THE NECK WITHOUT AND WITH CONTRAST TECHNIQUE: Multiplanar, multiecho pulse sequences of the brain, circle of willis and surrounding structures were obtained without intravenous contrast. Angiographic images of the neck were obtained using MRA technique without and with  intravenous contrast. CONTRAST:  7.5 cc Gadavist intravenous COMPARISON:  12/23/2017 FINDINGS: MR HEAD FINDINGS Brain: 7 mm acute infarct in the midbrain, just anterior and right para median to the cerebral aqua duct. Remote lacunar infarcts in the bilateral medial thalamus. Small remote left frontal cortex infarcts seen on FLAIR imaging image 36. No acute hemorrhage, hydrocephalus, or masslike finding. Vascular: Arterial findings described below. Skull and upper cervical spine: Scalp scarring at the vertex. Negative for marrow lesion Sinuses/Orbits: Bilateral cataract resection. MR CIRCLE OF WILLIS FINDINGS Lower V4 segments are incompletely covered but are seen on neck MRA. The left vertebral artery is dominant. Bilateral PICA patency. Small basilar in the setting of hypoplastic P1 segments, especially on the right. No branch occlusion, beading, or aneurysm. There is mild atheromatous irregularity of the basilar. MRA NECK FINDINGS Time-of-flight shows antegrade flow in both carotid and vertebral circulations. Postcontrast imaging shows the arch which is normal diameter with 3 vessel branching. There is atheromatous luminal irregularity at the carotid bifurcations without flow limiting stenosis or ulceration. Proximal subclaviansare widely patent. There is moderate narrowing at the origin the right vertebral artery measured at 50%. Mild atheromatous irregularity of the right V4 segment. IMPRESSION: Brain MRI: 1. Acute lacunar infarct in the right para median midbrain. 2. Remote lacunar infarcts in the bilateral medial thalamus. Intracranial MRA: 1. No emergent finding. 2. Atherosclerosis without flow limiting stenosis. Neck MRA: 1. 50% narrowing at the origin of the non dominant right vertebral artery. 2. Cervical carotid atherosclerosis without flow limiting stenosis. Electronically Signed   By: Monte Fantasia M.D.   On: 05/01/2018 10:25  Mr Brain Wo Contrast  Result Date: 05/01/2018 CLINICAL DATA:  Visual  disturbance with double vision and dizziness. Improved symptoms after tPA EXAM: MR HEAD WITHOUT CONTRAST MR CIRCLE OF WILLIS WITHOUT CONTRAST MRA OF THE NECK WITHOUT AND WITH CONTRAST TECHNIQUE: Multiplanar, multiecho pulse sequences of the brain, circle of willis and surrounding structures were obtained without intravenous contrast. Angiographic images of the neck were obtained using MRA technique without and with intravenous contrast. CONTRAST:  7.5 cc Gadavist intravenous COMPARISON:  12/23/2017 FINDINGS: MR HEAD FINDINGS Brain: 7 mm acute infarct in the midbrain, just anterior and right para median to the cerebral aqua duct. Remote lacunar infarcts in the bilateral medial thalamus. Small remote left frontal cortex infarcts seen on FLAIR imaging image 36. No acute hemorrhage, hydrocephalus, or masslike finding. Vascular: Arterial findings described below. Skull and upper cervical spine: Scalp scarring at the vertex. Negative for marrow lesion Sinuses/Orbits: Bilateral cataract resection. MR CIRCLE OF WILLIS FINDINGS Lower V4 segments are incompletely covered but are seen on neck MRA. The left vertebral artery is dominant. Bilateral PICA patency. Small basilar in the setting of hypoplastic P1 segments, especially on the right. No branch occlusion, beading, or aneurysm. There is mild atheromatous irregularity of the basilar. MRA NECK FINDINGS Time-of-flight shows antegrade flow in both carotid and vertebral circulations. Postcontrast imaging shows the arch which is normal diameter with 3 vessel branching. There is atheromatous luminal irregularity at the carotid bifurcations without flow limiting stenosis or ulceration. Proximal subclaviansare widely patent. There is moderate narrowing at the origin the right vertebral artery measured at 50%. Mild atheromatous irregularity of the right V4 segment. IMPRESSION: Brain MRI: 1. Acute lacunar infarct in the right para median midbrain. 2. Remote lacunar infarcts in the  bilateral medial thalamus. Intracranial MRA: 1. No emergent finding. 2. Atherosclerosis without flow limiting stenosis. Neck MRA: 1. 50% narrowing at the origin of the non dominant right vertebral artery. 2. Cervical carotid atherosclerosis without flow limiting stenosis. Electronically Signed   By: Monte Fantasia M.D.   On: 05/01/2018 10:25   Dg Chest Portable 1 View  Result Date: 04/30/2018 CLINICAL DATA:  Dizziness. EXAM: PORTABLE CHEST 1 VIEW COMPARISON:  09/30/2015 FINDINGS: The cardiomediastinal silhouette is within normal limits. The lungs are less well inflated than on the prior study with minimal atelectasis in the lung bases. No edema, pleural effusion, or pneumothorax is identified. No acute osseous abnormality is seen. IMPRESSION: No active disease. Electronically Signed   By: Logan Bores M.D.   On: 04/30/2018 20:01   Mr Jodene Nam Head Wo Contrast  Result Date: 05/01/2018 CLINICAL DATA:  Visual disturbance with double vision and dizziness. Improved symptoms after tPA EXAM: MR HEAD WITHOUT CONTRAST MR CIRCLE OF WILLIS WITHOUT CONTRAST MRA OF THE NECK WITHOUT AND WITH CONTRAST TECHNIQUE: Multiplanar, multiecho pulse sequences of the brain, circle of willis and surrounding structures were obtained without intravenous contrast. Angiographic images of the neck were obtained using MRA technique without and with intravenous contrast. CONTRAST:  7.5 cc Gadavist intravenous COMPARISON:  12/23/2017 FINDINGS: MR HEAD FINDINGS Brain: 7 mm acute infarct in the midbrain, just anterior and right para median to the cerebral aqua duct. Remote lacunar infarcts in the bilateral medial thalamus. Small remote left frontal cortex infarcts seen on FLAIR imaging image 36. No acute hemorrhage, hydrocephalus, or masslike finding. Vascular: Arterial findings described below. Skull and upper cervical spine: Scalp scarring at the vertex. Negative for marrow lesion Sinuses/Orbits: Bilateral cataract resection. MR CIRCLE OF WILLIS  FINDINGS Lower V4 segments  are incompletely covered but are seen on neck MRA. The left vertebral artery is dominant. Bilateral PICA patency. Small basilar in the setting of hypoplastic P1 segments, especially on the right. No branch occlusion, beading, or aneurysm. There is mild atheromatous irregularity of the basilar. MRA NECK FINDINGS Time-of-flight shows antegrade flow in both carotid and vertebral circulations. Postcontrast imaging shows the arch which is normal diameter with 3 vessel branching. There is atheromatous luminal irregularity at the carotid bifurcations without flow limiting stenosis or ulceration. Proximal subclaviansare widely patent. There is moderate narrowing at the origin the right vertebral artery measured at 50%. Mild atheromatous irregularity of the right V4 segment. IMPRESSION: Brain MRI: 1. Acute lacunar infarct in the right para median midbrain. 2. Remote lacunar infarcts in the bilateral medial thalamus. Intracranial MRA: 1. No emergent finding. 2. Atherosclerosis without flow limiting stenosis. Neck MRA: 1. 50% narrowing at the origin of the non dominant right vertebral artery. 2. Cervical carotid atherosclerosis without flow limiting stenosis. Electronically Signed   By: Monte Fantasia M.D.   On: 05/01/2018 10:25   Ct Head Code Stroke Wo Contrast  Result Date: 04/30/2018 CLINICAL DATA:  Code stroke. 84 y/o M; dizziness and blurred vision for 1 hour. History of stroke. EXAM: CT HEAD WITHOUT CONTRAST TECHNIQUE: Contiguous axial images were obtained from the base of the skull through the vertex without intravenous contrast. COMPARISON:  12/23/2017 CT head and MRI head. FINDINGS: Brain: No evidence of acute infarction, hemorrhage, hydrocephalus, extra-axial collection or mass lesion/mass effect. Small chronic lacunar infarcts are present within the bilateral thalami. There are mild chronic microvascular ischemic changes of the brain and volume loss. Very small chronic cortical  infarction within the left lateral frontal lobe. Vascular: Calcific atherosclerosis of carotid siphons. No hyperdense vessel identified. Skull: Normal. Negative for fracture or focal lesion. Sinuses/Orbits: No acute finding. Other: None. ASPECTS [facility] Stroke Program Early CT Score) - Ganglionic level infarction (caudate, lentiform nuclei, internal capsule, insula, M1-M3 cortex): 7 - Supraganglionic infarction (M4-M6 cortex): 3 Total score (0-10 with 10 being normal): 10 IMPRESSION: 1. No acute intracranial abnormality identified.  ASPECTS is 10. 2. Stable chronic microvascular ischemic changes and volume loss of the brain. Stable small chronic infarcts within the thalami and left lateral frontal lobe. These results were called by telephone at the time of interpretation on 04/30/2018 at 8:10 pm to Dr. Conni Slipper , who verbally acknowledged these results. Electronically Signed   By: Kristine Garbe M.D.   On: 04/30/2018 20:10    EKG:   Orders placed or performed during the hospital encounter of 04/30/18  . EKG 12-Lead  . EKG 12-Lead  . EKG 12-Lead  . EKG 12-Lead  . EKG 12-Lead  . EKG 12-Lead  . ED EKG  . ED EKG  . EKG    ASSESSMENT AND PLAN:   84 year old male with past medical history significant for stroke on aspirin, GERD, hypertension and hyperlipidemia presents to hospital secondary to diplopia.  1.  Acute CVA-right midbrain infarct-status post IV TPA -Admitted to ICU, much improved symptoms -Transfer to floor.  Added aspirin and Plavix today.  On statin to keep the cholesterol low. -Appreciate neurology consult.  Echo with bubble study pending  2.  Hypertension-hold hydrochlorothiazide.  Also lisinopril atenolol on hold.  Can be restarted at discharge.  3.  GERD-PPI  4.  DVT prophylaxis-teds and SCDs  PT recommended home with home health   All the records are reviewed and case discussed with Care Management/Social Workerr. Management plans  discussed with the  patient, family and they are in agreement.  CODE STATUS: Full code  TOTAL TIME TAKING CARE OF THIS PATIENT: 37 minutes.   POSSIBLE D/C IN 1-2 DAYS, DEPENDING ON CLINICAL CONDITION.   Gladstone Lighter M.D on 05/02/2018 at 4:37 PM  Between 7am to 6pm - Pager - [phone]  After 6pm go to [email or web address] - password [email or web address]  Office  [phone]  CC: Primary care physician; Tracie Harrier, MD

## 2018-05-03 LAB — BASIC METABOLIC PANEL
ANION GAP: 8 (ref 5–15)
BUN: 26 mg/dL — AB (ref 8–23)
CALCIUM: 8.9 mg/dL (ref 8.9–10.3)
CO2: 27 mmol/L (ref 22–32)
Chloride: 99 mmol/L (ref 98–111)
Creatinine, Ser: 0.98 mg/dL (ref 0.61–1.24)
GFR calc Af Amer: >60 mL/min (ref >60)
GLUCOSE: 102 mg/dL — AB (ref 70–99)
Potassium: 3.3 mmol/L — ABNORMAL LOW (ref 3.5–5.1)
Sodium: 134 mmol/L — ABNORMAL LOW (ref 135–145)

## 2018-05-03 LAB — MAGNESIUM: Magnesium: 2.1 mg/dL (ref 1.7–2.4)

## 2018-05-03 MED ORDER — CLOPIDOGREL BISULFATE 75 MG PO TABS: 75.0000 mg | ORAL_TABLET | Freq: Every day | ORAL | 2 refills | Status: DC

## 2018-05-03 MED ORDER — LISINOPRIL 20 MG PO TABS: 20.0000 mg | ORAL_TABLET | Freq: Every day | ORAL | 2 refills | Status: DC

## 2018-05-03 MED ORDER — CLOPIDOGREL BISULFATE 75 MG PO TABS: 75.0000 mg | ORAL_TABLET | Freq: Every day | ORAL | 2 refills | Status: AC

## 2018-05-03 NOTE — Progress Notes (Signed)
Physical Therapy Treatment Patient Details Name: Ronald Haney MRN: [id number] DOB: [birth year] Today's Date: 05/03/2018    History of Present Illness 85yo male pt admitted with positive CVA work up and s/p TPA. PMHx includes GERD, HLD, HTN, hx skin cancer, and CVA in June 2019.     PT Comments    Diplopia continues.  Pt reports little to no change.  Sitting in chair, dressed.  Stood without assist and was able to ambulate x 3 around unit with walker and min guard.  He did walk about 9' without AD but overall decreased gait quality noted and pt stated he felt safer with walker at this time.  Discussed with pt and family regarding safety.  Encouraged pt to slow down to increase overall safety and decrease fall risk.  They voiced understanding.   Follow Up Recommendations  Supervision for mobility/OOB;Home health PT     Equipment Recommendations  Rolling walker with 5" wheels    Recommendations for Other Services       Precautions / Restrictions Precautions Precautions: Fall Restrictions Weight Bearing Restrictions: No    Mobility  Bed Mobility               General bed mobility comments: deferred, up in chair  Transfers Overall transfer level: Modified independent Equipment used: Rolling walker (2 wheeled) Transfers: Sit to/from Stand Sit to Stand: Supervision            Ambulation/Gait Ambulation/Gait assistance: Min guard;Supervision Gait Distance (Feet): 450 Feet Assistive device: Rolling walker (2 wheeled) Gait Pattern/deviations: Step-through pattern Gait velocity: varied speed - balance decreased with increased speed   General Gait Details: verbal cues to keep walker closer and to slow down   Stairs             Wheelchair Mobility    Modified Rankin (Stroke Patients Only)       Balance Overall balance assessment: Needs assistance Sitting-balance support: Feet supported Sitting balance-Leahy Scale: Good     Standing balance  support: Bilateral upper extremity supported Standing balance-Leahy Scale: Fair                              Cognition Arousal/Alertness: Awake/alert Behavior During Therapy: WFL for tasks assessed/performed Overall Cognitive Status: Within Functional Limits for tasks assessed                                        Exercises Other Exercises Other Exercises: safety education with pt and family    General Comments        Pertinent Vitals/Pain Pain Assessment: No/denies pain    Home Living                      Prior Function            PT Goals (current goals can now be found in the care plan section) Progress towards PT goals: Progressing toward goals    Frequency    7X/week      PT Plan Current plan remains appropriate    Co-evaluation              AM-PAC PT "6 Clicks" Daily Activity  Outcome Measure  Difficulty turning over in bed (including adjusting bedclothes, sheets and blankets)?: None Difficulty moving from lying on back to sitting on the side of the  bed? : None Difficulty sitting down on and standing up from a chair with arms (e.g., wheelchair, bedside commode, etc,.)?: None Help needed moving to and from a bed to chair (including a wheelchair)?: None Help needed walking in hospital room?: A Little Help needed climbing 3-5 steps with a railing? : A Little 6 Click Score: 22    End of Session Equipment Utilized During Treatment: Gait belt Activity Tolerance: Patient tolerated treatment well Patient left: in chair;with call bell/phone within reach         Time: 0827-0835 PT Time Calculation (min) (ACUTE ONLY): 8 min  Charges:  $Gait Training: 8-22 mins                     Chesley Noon, PTA 05/03/18, 8:48 AM

## 2018-05-03 NOTE — Discharge Summary (Signed)
[facility] NAME: Ronald Haney    MR#:  [id number]  DATE OF BIRTH:  03/01/1931  DATE OF ADMISSION:  04/30/2018   ADMITTING PHYSICIAN: Lance Coon, MD  DATE OF DISCHARGE:  05/03/18  PRIMARY CARE PHYSICIAN: Tracie Harrier, MD   ADMISSION DIAGNOSIS:   Cerebrovascular accident (CVA), unspecified mechanism ([place]) [I63.9]  DISCHARGE DIAGNOSIS:   Principal Problem:   Visual disturbance Active Problems:   HTN (hypertension)   HLD (hyperlipidemia)   GERD (gastroesophageal reflux disease)   SECONDARY DIAGNOSIS:   Past Medical History:  Diagnosis Date  . GERD (gastroesophageal reflux disease)   . Hyperlipemia   . Hypertension   . Skin cancer   . Stroke [facility])     HOSPITAL COURSE:   84 year old male with past medical history significant for stroke on aspirin, GERD, hypertension and hyperlipidemia presents to hospital secondary to diplopia.  1.  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  On statin to keep the cholesterol low. -Appreciate neurology consult.  Echo with bubble study with no evidence of cardiac source of emboli  2.  Hypertension-hold hydrochlorothiazide and atenolol as BP low and heart rate borderline - decrease the dose of lisinopril at discharge  3.  GERD-PPI   PT recommended home with home health Discharge today Ambulating independently  DISCHARGE CONDITIONS:   Guarded  CONSULTS OBTAINED:   Treatment Team:  Alexis Goodell, MD  DRUG ALLERGIES:   No Known Allergies DISCHARGE MEDICATIONS:   Allergies as of 05/03/2018   No Known Allergies     Medication List    STOP taking these medications   atenolol 25 MG tablet Commonly known as:  TENORMIN   hydrochlorothiazide 25 MG tablet Commonly known as:  HYDRODIURIL     TAKE these  medications   aspirin 81 MG chewable tablet Chew 81 mg by mouth daily.   atorvastatin 40 MG tablet Commonly known as:  LIPITOR Take 1 tablet (40 mg total) by mouth daily.   clopidogrel 75 MG tablet Commonly known as:  PLAVIX Take 1 tablet (75 mg total) by mouth daily. Start taking on:  05/04/2018   fluticasone 50 MCG/ACT nasal spray Commonly known as:  FLONASE Place 2 sprays into both nostrils daily.   gemfibrozil 600 MG tablet Commonly known as:  LOPID Take 600 mg by mouth 2 (two) times daily.   lisinopril 20 MG tablet Commonly known as:  PRINIVIL,ZESTRIL Take 1 tablet (20 mg total) by mouth daily. What changed:    medication strength  how much to take   omeprazole 20 MG capsule Commonly known as:  PRILOSEC Take 20 mg by mouth daily.        DISCHARGE INSTRUCTIONS:   1. PCP f/u in 1 week 2. Neurology f/u in 2 weeks  DIET:   Cardiac diet  ACTIVITY:   Activity as tolerated  OXYGEN:   Home Oxygen: No.  Oxygen Delivery: room air  DISCHARGE LOCATION:   home   If you experience worsening of your admission symptoms, develop shortness of breath, life threatening emergency, suicidal or homicidal thoughts you must seek medical attention immediately by calling 911 or calling your MD immediately  if symptoms less severe.  You Must read complete instructions/literature along with all the possible adverse reactions/side effects for all the Medicines you take and that have been prescribed to you.  Take any new Medicines after you have completely understood and accpet all the possible adverse reactions/side effects.   Please note  You were cared for by a hospitalist during your hospital stay. If you have any questions about your discharge medications or the care you received while you were in the hospital after you are discharged, you can call the unit and asked to speak with the hospitalist on call if the hospitalist that took care of you is not available. Once you  are discharged, your primary care physician will handle any further medical issues. Please note that NO REFILLS for any discharge medications will be authorized once you are discharged, as it is imperative that you return to your primary care physician (or establish a relationship with a primary care physician if you do not have one) for your aftercare needs so that they can reassess your need for medications and monitor your lab values.    On the day of Discharge:  VITAL SIGNS:   Blood pressure 119/64, pulse 66, temperature 97.7 F (36.5 C), temperature source Oral, resp. rate 18, height 5\' 10"  (1.778 m), weight 78.7 kg, SpO2 99 %.  PHYSICAL EXAMINATION:    GENERAL:  84 y.o.-year-old patient lying in the bed with no acute distress.  EYES: Pupils equal, round, reactive to light and accommodation. No scleral icterus. Extraocular muscles intact.  HEENT: Head atraumatic, normocephalic. Oropharynx and nasopharynx clear.  NECK:  Supple, no jugular venous distention. No thyroid enlargement, no tenderness.  LUNGS: Normal breath sounds bilaterally, no wheezing, rales,rhonchi or crepitation. No use of accessory muscles of respiration.  CARDIOVASCULAR: S1, S2 normal. No murmurs, rubs, or gallops.  ABDOMEN: Soft, nontender, nondistended. Bowel sounds present. No organomegaly or mass.  EXTREMITIES: No pedal edema, cyanosis, or clubbing.  NEUROLOGIC: Cranial nerves II through XII are intact.  Diplopia still present intermittently.  Muscle strength 5/5 in all extremities. Sensation intact. Gait not checked.  PSYCHIATRIC: The patient is alert and oriented x 3.  SKIN: No obvious rash, lesion, or ulcer.    DATA REVIEW:   CBC Recent Labs  Lab 05/02/18 0512  WBC 8.1  HGB 15.5  HCT 45.5  PLT 147*    Chemistries  Recent Labs  Lab 04/30/18 1936  05/03/18 0307  NA 134*   < > 134*  K 3.6   < > 3.3*  CL 101   < > 99  CO2 25   < > 27  GLUCOSE 133*   < > 102*  BUN 32*   < > 26*  CREATININE  1.14   < > 0.98  CALCIUM 9.0   < > 8.9  MG  --    < > 2.1  AST 23  --   --   ALT 19  --   --   ALKPHOS 73  --   --   BILITOT 0.8  --   --    < > = values in this interval not displayed.     Microbiology Results  Results for orders placed or performed during the hospital encounter of 04/30/18  MRSA PCR Screening     Status: None   Collection Time: 05/01/18  3:11 AM  Result Value Ref Range Status   MRSA by PCR NEGATIVE NEGATIVE Final    Comment:        The GeneXpert MRSA Assay (FDA approved for NASAL specimens only), is one component of a comprehensive MRSA colonization surveillance program. It is not intended to diagnose MRSA infection nor  to guide or monitor treatment for MRSA infections. Performed at [facility], [address], [place], [place] [address]     RADIOLOGY:  No results found.   Management plans discussed with the patient, family and they are in agreement.  CODE STATUS:     Code Status Orders  (From admission, onward)         Start     Ordered   05/01/18 0258  Full code  Continuous     05/01/18 0257        Code Status History    Date Active Date Inactive Code Status Order ID Comments User Context   12/23/2017 1048 12/24/2017 1529 Full Code [id number]  Dustin Flock, MD ED    Advance Directive Documentation     Most Recent Value  Type of Advance Directive  Healthcare Power of Attorney  Pre-existing out of facility DNR order (yellow form or pink MOST form)  -  "MOST" Form in Place?  -      TOTAL TIME TAKING CARE OF THIS PATIENT: 38 minutes.    Gladstone Lighter M.D on 05/03/2018 at 10:36 AM  Between 7am to 6pm - Pager - [phone]  After 6pm go to [email or web address] - Proofreader  [facility]  Office  [phone]  CC: Primary care physician; Tracie Harrier, MD   Note: This dictation was prepared with Dragon dictation along with smaller phrase technology. Any transcriptional errors that  result from this process are unintentional.

## 2018-05-03 NOTE — Care Management Note (Addendum)
Case Management Note  Patient Details  Name: Ronald Haney MRN: [id number] Date of Birth: 10-05-1930  Subjective/Objective:  Admitted to [facility] with diagnosis of visual disturbance. Lives with wife, [name] [phone]). Last seen Dr, Ginette Pitman 4 weeks ago. Seen Dr. Altamese Cataract  at [facility] in June. Prescriptions are filled at [facility].  No home Health. No skilled facility. No home oxygen. Uses a "walking stick." No other medical equipment. Takes care of all basic activities of daily living himself, drives. No falls. Appetite is better. Family will transport                  Action/Plan: Physical therapy is recommending therapies in the home. Will give this case to [facility], [name] has a Soil scientist with the [employer]. [facility]   Expected Discharge Date:  05/03/18               Expected Discharge Plan:     In-House Referral:   yes  Discharge planning Services   yes  Post Acute Care Choice:   yes Choice offered to:   patient  DME Arranged:    DME Agency:     HH Arranged:   yes HH Agency:   [facility]  Status of Service:     If discussed at [place] of Stay Meetings, dates discussed:    Additional Comments:  Shelbie Ammons, RN MSN CCM Care Management [phone] 05/03/2018, 12:00 PM

## 2018-05-08 ENCOUNTER — Telehealth: Payer: Self-pay

## 2018-05-08 NOTE — Telephone Encounter (Signed)
EMMI Follow-up: Noted on the report that the patient wasn't sure who to contact if there was a change in his condition.  I left a voice message with my contact information for Ronald Haney to call me at his convenience.

## 2020-03-27 IMAGING — US US CAROTID DUPLEX BILAT
1 series · 13 of 24 positions shown · non-contrast
Comparison: None.

CLINICAL DATA: Stroke.  Hypertension, hyperlipidemia.

EXAM:
BILATERAL CAROTID DUPLEX ULTRASOUND
TECHNIQUE: Gray scale imaging, color Doppler and duplex ultrasound was
performed of bilateral carotid and vertebral arteries in the neck.
TECHNIQUE: Quantification of carotid stenosis is based on velocity parameters
that correlate the residual internal carotid diameter with
NASCET-based stenosis levels, using the diameter of the distal
internal carotid lumen as the denominator for stenosis measurement.

[Series 1: us carotid duplex bilat · 13 of 66 slices shown]
[im 1/66]
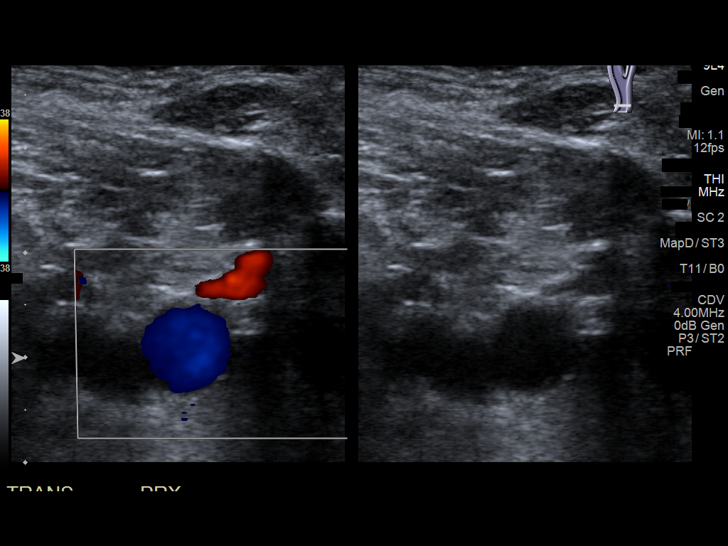
[im 6/66]
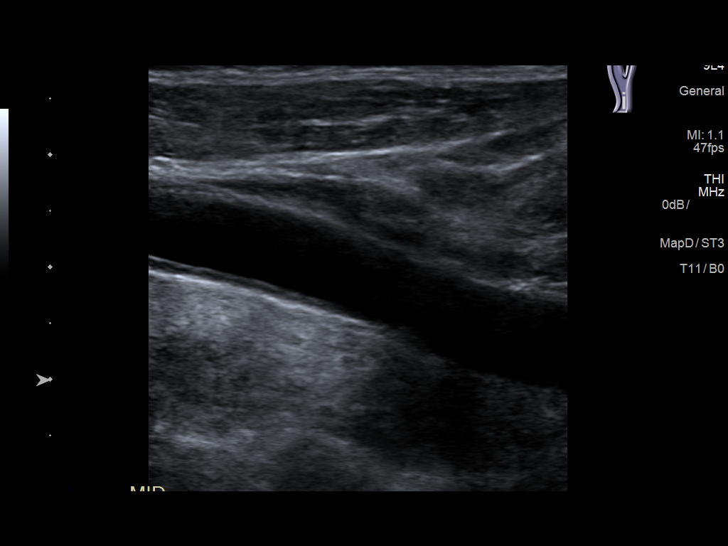
[im 12/66]
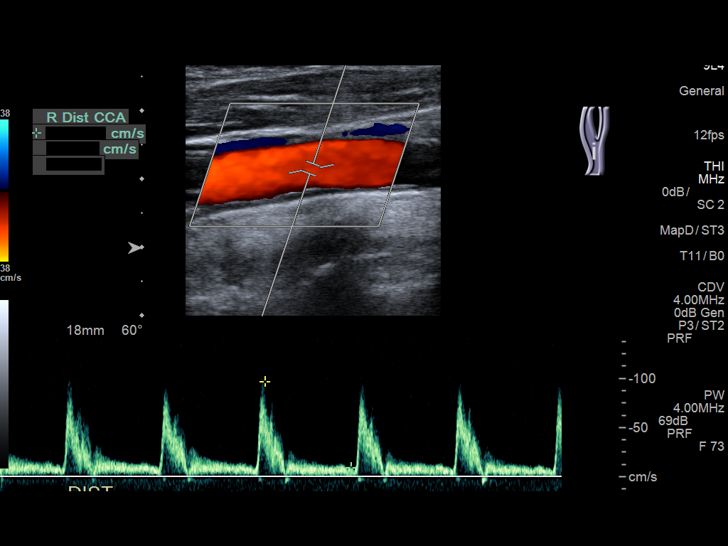
[im 17/66]
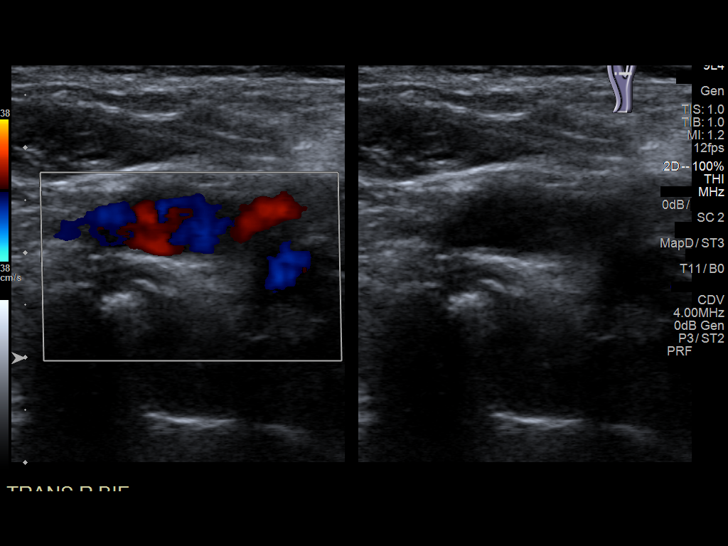
[im 23/66]
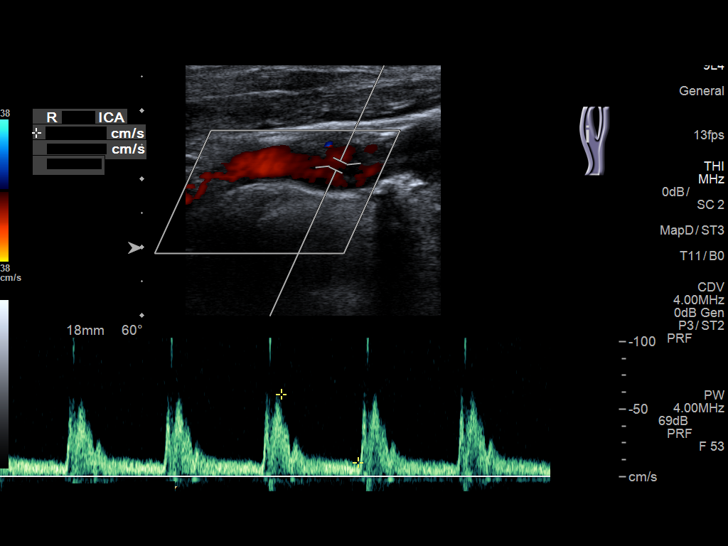
[im 29/66]
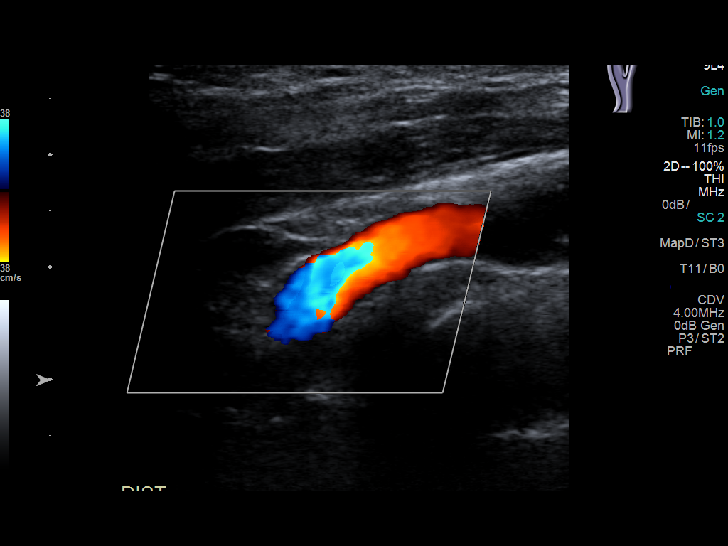
[im 34/66]
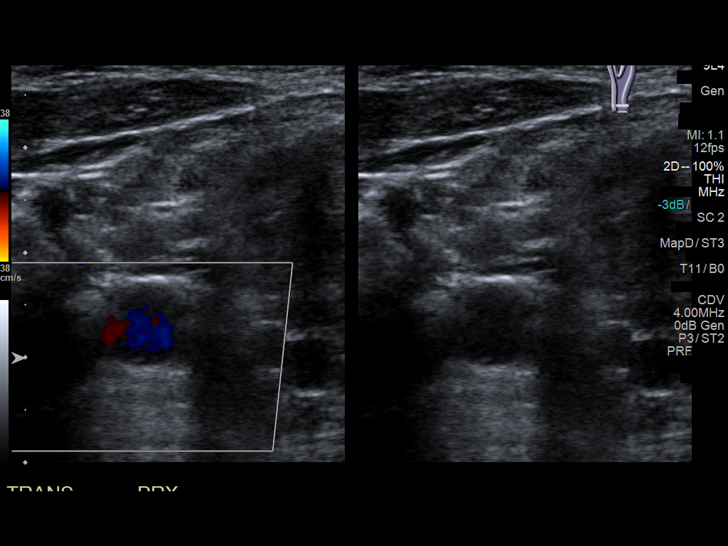
[im 37/66]
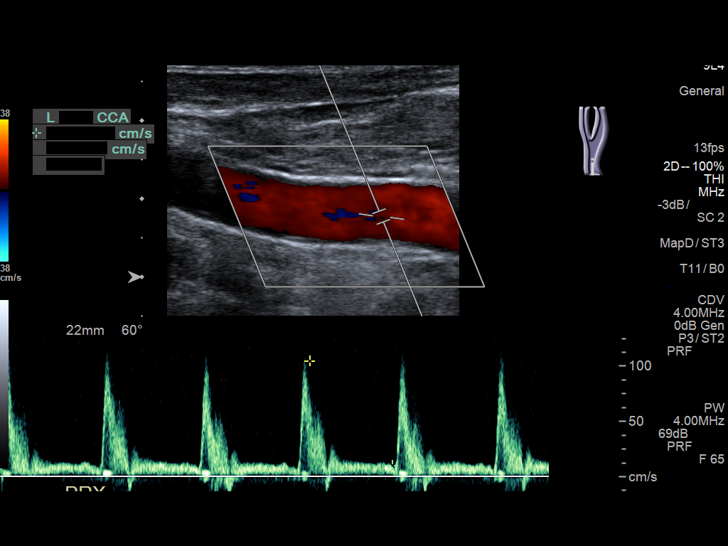
[im 43/66]
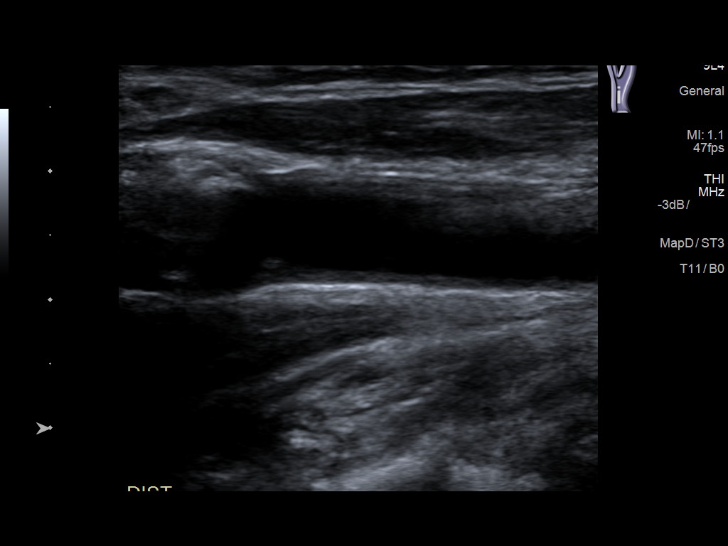
[im 49/66]
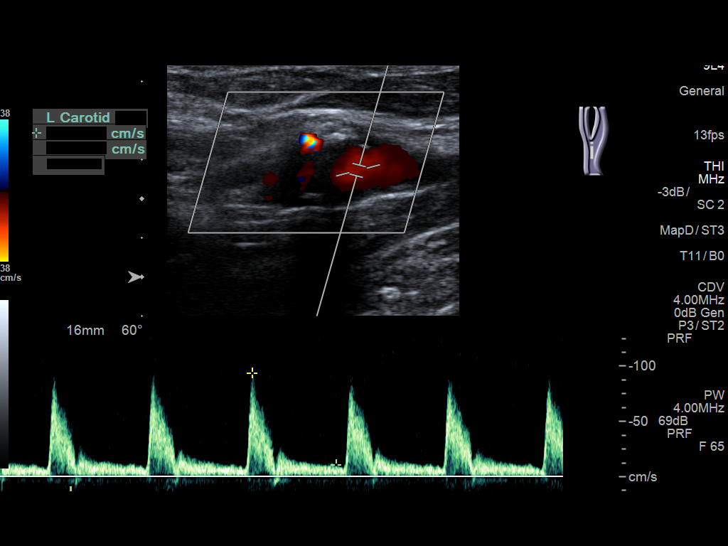
[im 54/66]
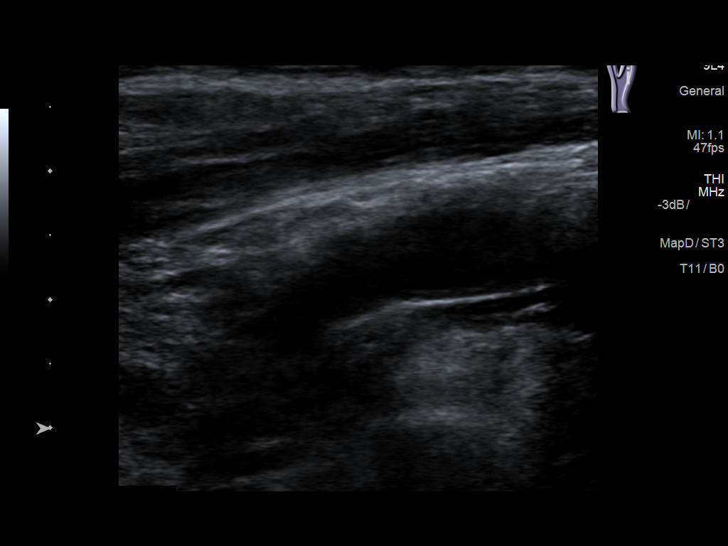
[im 60/66]
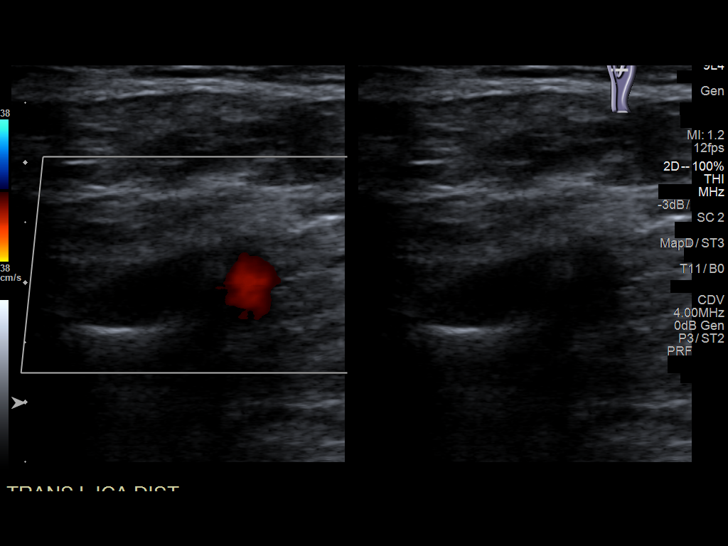
[im 66/66]
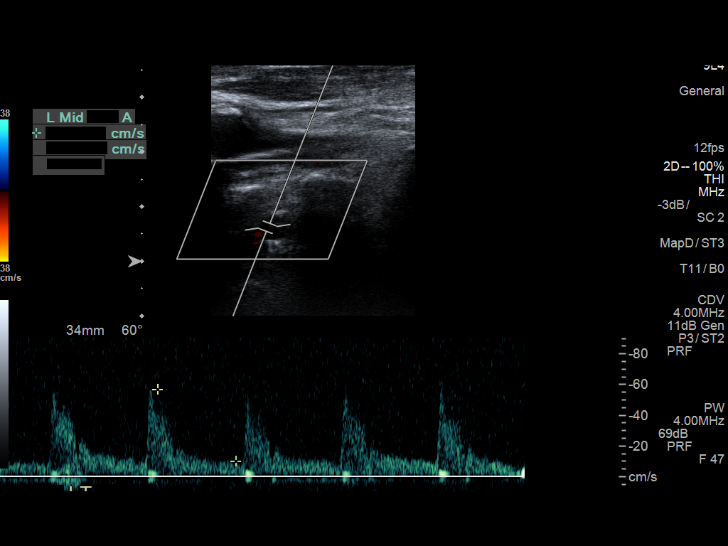

[13 of 24 positions shown; findings below may reference images not displayed]

The following velocity measurements were obtained:

PEAK SYSTOLIC/END DIASTOLIC

RIGHT

ICA:                     123/27cm/sec

CCA:                     118/11cm/sec

SYSTOLIC ICA/CCA RATIO:

ECA:                     164cm/sec

LEFT

ICA:                     116/23cm/sec

CCA:                     113/13cm/sec

SYSTOLIC ICA/CCA RATIO:  1

ECA:                     122cm/sec
FINDINGS: RIGHT CAROTID ARTERY: Smooth noncalcified plaque in the distal
common carotid artery. Mildly irregular plaque in the carotid bulb
extending into proximal internal and external carotid arteries,
resulting in at least mild stenosis. Normal waveforms and color
Doppler signal. Mild tortuosity of the ICA.

RIGHT VERTEBRAL ARTERY:  Normal flow direction and waveform.

LEFT CAROTID ARTERY: Irregular partially calcified plaque in the
carotid bulb. No high-grade stenosis. Normal waveforms and color
Doppler signal.

LEFT VERTEBRAL ARTERY: Normal flow direction and waveform.
IMPRESSION: 1. Bilateral carotid bifurcation plaque resulting in less than 50%
diameter stenosis.
2.  Antegrade bilateral vertebral arterial flow.

## 2020-08-17 DIAGNOSIS — E781 Pure hyperglyceridemia: Secondary | ICD-10-CM | POA: Diagnosis not present

## 2020-08-17 DIAGNOSIS — Z8673 Personal history of transient ischemic attack (TIA), and cerebral infarction without residual deficits: Secondary | ICD-10-CM | POA: Diagnosis not present

## 2020-08-17 DIAGNOSIS — I10 Essential (primary) hypertension: Secondary | ICD-10-CM | POA: Diagnosis not present

## 2020-09-04 DIAGNOSIS — I10 Essential (primary) hypertension: Secondary | ICD-10-CM | POA: Diagnosis not present

## 2020-09-04 DIAGNOSIS — M48061 Spinal stenosis, lumbar region without neurogenic claudication: Secondary | ICD-10-CM | POA: Diagnosis not present

## 2020-09-04 DIAGNOSIS — Z6825 Body mass index (BMI) 25.0-25.9, adult: Secondary | ICD-10-CM | POA: Diagnosis not present

## 2020-09-11 ENCOUNTER — Other Ambulatory Visit: Payer: Self-pay | Admitting: Neurological Surgery

## 2020-10-08 ENCOUNTER — Other Ambulatory Visit: Payer: Self-pay

## 2020-10-08 ENCOUNTER — Other Ambulatory Visit
Admission: RE | Admit: 2020-10-08 | Discharge: 2020-10-08 | Disposition: A | Discharge status: Home / Self Care | Payer: Medicare HMO | Source: Ambulatory Visit | Attending: Neurological Surgery | Admitting: Neurological Surgery

## 2020-10-08 DIAGNOSIS — Z20822 Contact with and (suspected) exposure to covid-19: Secondary | ICD-10-CM | POA: Insufficient documentation

## 2020-10-08 DIAGNOSIS — Z01812 Encounter for preprocedural laboratory examination: Secondary | ICD-10-CM | POA: Diagnosis not present

## 2020-10-08 LAB — SARS CORONAVIRUS 2 (TAT 6-24 HRS): SARS Coronavirus 2: NEGATIVE

## 2020-10-09 ENCOUNTER — Encounter (HOSPITAL_COMMUNITY): Payer: Self-pay | Admitting: Neurological Surgery

## 2020-10-09 ENCOUNTER — Other Ambulatory Visit: Payer: Self-pay

## 2020-10-09 NOTE — Progress Notes (Addendum)
Ronald Haney denies chest pain or shortness of breath. Prior to back issue- approximately 1 year ago, Ronald Haney walked two miles a day. Patient was tested for Covid and has been in quarantine since that time. Ronald Haney reports that he will be able to quarantine this weekend with the people that live in the house with him.  Ronald Haney denies having an EKG in the past 12 months, last EKG done at the [place] in [place] has been requested.  Ronald Haney states that last dose of ASA and Plavix was 10/03/20.  I asked an anesthesiology PA-C to review chart.

## 2020-10-09 NOTE — Progress Notes (Signed)
Anesthesia Chart Review: Ronald Haney   Case: [id number] Date/Time: 10/12/20 1132   Procedure: Laminectomy and Foraminotomy - L4-L5 - bilateral (Bilateral Back) - 3C   Anesthesia type: General   Pre-op diagnosis: Stenosis   Location: [facility] OR ROOM 19 / [facility]   Surgeons: Kristeen Miss, MD      DISCUSSION: Patient is an 87 year old male scheduled for the above procedure.  History includes never smoker, HTN, HLD, GERD, hiatal hernia, CVA (11/2017), skin cancer, BPH (s/p TURP), cholecystectomy.  Last EKG currently available > 1 year ago. PAT RN requested last tracing from [place], but given 10/09/20 holiday would not anticipate getting any receipt of outside records prior to the day of surgery. Stated last Plavix and ASA 10/03/20. He denied chest pain and SOB per PAT RN phone interview.  Pfizer COVID-19 vaccine #3 05/01/20 ([id number]). 10/08/20 COVID-19 test negative.   He is a same day work-up, so anesthesia team evaluation on the day of surgery. Labs and EKG as indicated.   VS: Ht 5\' 10"  (1.778 m)   Wt 80.7 kg   BMI 25.54 kg/m     PROVIDERS: Tracie Harrier, MD is listed as PCP. He also receives care through the [facility].   LABS: For day of surgery. As of 04/13/20 ([facility]), H/H 14.8/45.3, PLT 141K, BUN 21, Cr 1.2, AST 15, ALT 15.   IMAGES: MRI L-spine 06/30/20: Images viewable in [facility]/PACS.   EKG: Last EKG currently available > 1 year ago.   CV: Echo 05/01/18: Study Conclusions  - Left ventricle: The cavity size was normal. Systolic function was  normal. The estimated ejection fraction was in the range of 60%  to 65%.  - Mitral valve: Valve area by pressure half-time: 1.8 cm^2.   Carotid US 12/23/17: IMPRESSION: 1. Bilateral carotid bifurcation plaque resulting in less than 50% diameter stenosis. 2.  Antegrade bilateral vertebral arterial flow.   Past Medical History:  Diagnosis Date  . GERD (gastroesophageal reflux disease)   . History of hiatal hernia   .  Hyperlipemia   . Hypertension   . Skin cancer    face and arms- squam  . Stroke ([place]) 2019   vision- has returned to 99%    Past Surgical History:  Procedure Laterality Date  . ANKLE SURGERY Right   . ccy    . CHOLECYSTECTOMY    . COLONOSCOPY    . TRANSURETHRAL RESECTION OF PROSTATE      MEDICATIONS: No current facility-administered medications for this encounter.   Marland Kitchen acetaminophen (TYLENOL) 500 MG tablet  . amLODipine (NORVASC) 5 MG tablet  . atorvastatin (LIPITOR) 40 MG tablet  . clopidogrel (PLAVIX) 75 MG tablet  . gemfibrozil (LOPID) 600 MG tablet  . lisinopril (ZESTRIL) 40 MG tablet  . omeprazole (PRILOSEC) 20 MG capsule  . aspirin 81 MG chewable tablet    Myra Gianotti, PA-C Surgical Short Stay/Anesthesiology [facility] Phone [phone] [facility] Phone [phone] 10/09/2020 11:28 AM

## 2020-10-09 NOTE — Anesthesia Preprocedure Evaluation (Addendum)
Anesthesia Evaluation  Patient identified by MRN, date of birth, ID band Patient awake    Reviewed: Allergy & Precautions, NPO status , Patient's Chart, lab work & pertinent test results  History of Anesthesia Complications Negative for: history of anesthetic complications  Airway Mallampati: II  TM Distance: >3 FB Neck ROM: Full    Dental no notable dental hx. (+) Dental Advisory Given   Pulmonary neg pulmonary ROS,    Pulmonary exam normal        Cardiovascular hypertension, Pt. on medications Normal cardiovascular exam  Echo 05/01/18: Study Conclusions  - Left ventricle: The cavity size was normal. Systolic function was  normal. The estimated ejection fraction was in the range of 60%  to 65%.  - Mitral valve: Valve area by pressure half-time: 1.8 cm^2.   Carotid US 12/23/17: IMPRESSION: 1. Bilateral carotid bifurcation plaque resulting in less than 50% diameter stenosis. 2. Antegrade bilateral vertebral arterial flow.    Neuro/Psych CVA, No Residual Symptoms    GI/Hepatic Neg liver ROS, hiatal hernia, GERD  ,  Endo/Other  negative endocrine ROS  Renal/GU negative Renal ROS     Musculoskeletal negative musculoskeletal ROS (+)   Abdominal   Peds  Hematology negative hematology ROS (+)   Anesthesia Other Findings   Reproductive/Obstetrics                          Anesthesia Physical Anesthesia Plan  ASA: III  Anesthesia Plan: General   Post-op Pain Management:    Induction: Intravenous  PONV Risk Score and Plan: 3 and Ondansetron, Dexamethasone and Diphenhydramine  Airway Management Planned: Oral ETT  Additional Equipment:   Intra-op Plan:   Post-operative Plan: Extubation in OR  Informed Consent: I have reviewed the patients History and Physical, chart, labs and discussed the procedure including the risks, benefits and alternatives for the proposed anesthesia  with the patient or authorized representative who has indicated his/her understanding and acceptance.     Dental advisory given  Plan Discussed with: Anesthesiologist and CRNA  Anesthesia Plan Comments: (PAT note written 10/09/2020 by Myra Gianotti, PA-C. )      Anesthesia Quick Evaluation

## 2020-10-12 ENCOUNTER — Encounter (HOSPITAL_COMMUNITY)
Admission: RE | Disposition: A | Discharge status: Home / Self Care | Payer: Self-pay | Source: Home / Self Care | Attending: Neurological Surgery

## 2020-10-12 ENCOUNTER — Observation Stay (HOSPITAL_COMMUNITY)
Admission: RE | Admit: 2020-10-12 | Discharge: 2020-10-13 | Disposition: A | Discharge status: Home / Self Care | Payer: Medicare HMO | Attending: Neurological Surgery | Admitting: Neurological Surgery

## 2020-10-12 ENCOUNTER — Ambulatory Visit (HOSPITAL_COMMUNITY): Payer: Medicare HMO

## 2020-10-12 ENCOUNTER — Encounter (HOSPITAL_COMMUNITY): Payer: Self-pay | Admitting: Neurological Surgery

## 2020-10-12 ENCOUNTER — Other Ambulatory Visit: Payer: Self-pay

## 2020-10-12 ENCOUNTER — Ambulatory Visit (HOSPITAL_COMMUNITY): Payer: Medicare HMO | Admitting: Vascular Surgery

## 2020-10-12 DIAGNOSIS — M7138 Other bursal cyst, other site: Secondary | ICD-10-CM | POA: Insufficient documentation

## 2020-10-12 DIAGNOSIS — M549 Dorsalgia, unspecified: Secondary | ICD-10-CM | POA: Diagnosis present

## 2020-10-12 DIAGNOSIS — I10 Essential (primary) hypertension: Secondary | ICD-10-CM | POA: Diagnosis not present

## 2020-10-12 DIAGNOSIS — M48062 Spinal stenosis, lumbar region with neurogenic claudication: Secondary | ICD-10-CM | POA: Diagnosis not present

## 2020-10-12 DIAGNOSIS — M4726 Other spondylosis with radiculopathy, lumbar region: Secondary | ICD-10-CM | POA: Insufficient documentation

## 2020-10-12 DIAGNOSIS — Z981 Arthrodesis status: Secondary | ICD-10-CM | POA: Diagnosis not present

## 2020-10-12 DIAGNOSIS — Z419 Encounter for procedure for purposes other than remedying health state, unspecified: Secondary | ICD-10-CM

## 2020-10-12 HISTORY — PX: LUMBAR LAMINECTOMY/DECOMPRESSION MICRODISCECTOMY: SHX5026

## 2020-10-12 HISTORY — DX: Personal history of other diseases of the digestive system: Z87.19

## 2020-10-12 LAB — BASIC METABOLIC PANEL
Anion gap: 9 (ref 5–15)
BUN: 20 mg/dL (ref 8–23)
CO2: 22 mmol/L (ref 22–32)
Calcium: 9.5 mg/dL (ref 8.9–10.3)
Chloride: 106 mmol/L (ref 98–111)
Creatinine, Ser: 1.02 mg/dL (ref 0.61–1.24)
GFR, Estimated: >60 mL/min (ref >60)
Glucose, Bld: 100 mg/dL — ABNORMAL HIGH (ref 70–99)
Potassium: 4.7 mmol/L (ref 3.5–5.1)
Sodium: 137 mmol/L (ref 135–145)

## 2020-10-12 LAB — CBC
HCT: 46 % (ref 39.0–52.0)
Hemoglobin: 15.5 g/dL (ref 13.0–17.0)
MCH: 33 pg (ref 26.0–34.0)
MCHC: 33.7 g/dL (ref 30.0–36.0)
MCV: 98.1 fL (ref 80.0–100.0)
Platelets: 178 10*3/uL (ref 150–400)
RBC: 4.69 MIL/uL (ref 4.22–5.81)
RDW: 12.6 % (ref 11.5–15.5)
WBC: 6.6 10*3/uL (ref 4.0–10.5)
nRBC: 0 % (ref 0.0–0.2)

## 2020-10-12 SURGERY — LUMBAR LAMINECTOMY/DECOMPRESSION MICRODISCECTOMY 1 LEVEL
Anesthesia: General | Site: Back | Laterality: Bilateral

## 2020-10-12 MED ORDER — PHENOL 1.4 % MT LIQD: 1.0000 | OROMUCOSAL | Status: DC | PRN

## 2020-10-12 MED ORDER — SODIUM CHLORIDE 0.9% FLUSH: 3.0000 mL | INTRAVENOUS | Status: DC | PRN

## 2020-10-12 MED ORDER — ONDANSETRON HCL 4 MG/2ML IJ SOLN
INTRAMUSCULAR | Status: DC | PRN
  Administered 2020-10-12: 4 mg via INTRAVENOUS

## 2020-10-12 MED ORDER — BUPIVACAINE HCL (PF) 0.5 % IJ SOLN
INTRAMUSCULAR | Status: AC
  Filled 2020-10-12: qty 30

## 2020-10-12 MED ORDER — CEFAZOLIN SODIUM-DEXTROSE 2-4 GM/100ML-% IV SOLN
2.0000 g | INTRAVENOUS | Status: AC
  Administered 2020-10-12: 2 g via INTRAVENOUS

## 2020-10-12 MED ORDER — ACETAMINOPHEN 500 MG PO TABS
ORAL_TABLET | ORAL | Status: AC
  Administered 2020-10-12: 1000 mg via ORAL
  Filled 2020-10-12: qty 2

## 2020-10-12 MED ORDER — FLEET ENEMA 7-19 GM/118ML RE ENEM: 1.0000 | ENEMA | Freq: Once | RECTAL | Status: DC | PRN

## 2020-10-12 MED ORDER — MORPHINE SULFATE (PF) 2 MG/ML IV SOLN: 2.0000 mg | INTRAVENOUS | Status: DC | PRN

## 2020-10-12 MED ORDER — 0.9 % SODIUM CHLORIDE (POUR BTL) OPTIME
TOPICAL | Status: DC | PRN
  Administered 2020-10-12: 1000 mL

## 2020-10-12 MED ORDER — PHENYLEPHRINE 40 MCG/ML (10ML) SYRINGE FOR IV PUSH (FOR BLOOD PRESSURE SUPPORT)
PREFILLED_SYRINGE | INTRAVENOUS | Status: AC
  Filled 2020-10-12: qty 10

## 2020-10-12 MED ORDER — OXYCODONE-ACETAMINOPHEN 5-325 MG PO TABS: 1.0000 | ORAL_TABLET | ORAL | Status: DC | PRN

## 2020-10-12 MED ORDER — BISACODYL 10 MG RE SUPP: 10.0000 mg | Freq: Every day | RECTAL | Status: DC | PRN

## 2020-10-12 MED ORDER — ORAL CARE MOUTH RINSE: 15.0000 mL | Freq: Once | OROMUCOSAL | Status: AC

## 2020-10-12 MED ORDER — PHENYLEPHRINE HCL-NACL 10-0.9 MG/250ML-% IV SOLN
INTRAVENOUS | Status: DC | PRN
  Administered 2020-10-12: 30 ug/min via INTRAVENOUS

## 2020-10-12 MED ORDER — LISINOPRIL 20 MG PO TABS
40.0000 mg | ORAL_TABLET | Freq: Every morning | ORAL | Status: DC
  Administered 2020-10-13: 40 mg via ORAL
  Filled 2020-10-12: qty 2

## 2020-10-12 MED ORDER — FENTANYL CITRATE (PF) 250 MCG/5ML IJ SOLN
INTRAMUSCULAR | Status: AC
  Filled 2020-10-12: qty 5

## 2020-10-12 MED ORDER — CELECOXIB 200 MG PO CAPS: 200.0000 mg | ORAL_CAPSULE | Freq: Once | ORAL | Status: AC

## 2020-10-12 MED ORDER — CEFAZOLIN SODIUM-DEXTROSE 2-4 GM/100ML-% IV SOLN
2.0000 g | Freq: Three times a day (TID) | INTRAVENOUS | Status: AC
  Administered 2020-10-12 – 2020-10-13 (×2): 2 g via INTRAVENOUS
  Filled 2020-10-12 (×2): qty 100

## 2020-10-12 MED ORDER — ACETAMINOPHEN 500 MG PO TABS: 1000.0000 mg | ORAL_TABLET | Freq: Once | ORAL | Status: AC

## 2020-10-12 MED ORDER — ATORVASTATIN CALCIUM 40 MG PO TABS
40.0000 mg | ORAL_TABLET | Freq: Every morning | ORAL | Status: DC
  Administered 2020-10-13: 40 mg via ORAL
  Filled 2020-10-12: qty 1

## 2020-10-12 MED ORDER — ACETAMINOPHEN 325 MG PO TABS
650.0000 mg | ORAL_TABLET | ORAL | Status: DC | PRN
  Administered 2020-10-12 – 2020-10-13 (×2): 650 mg via ORAL
  Filled 2020-10-12 (×2): qty 2

## 2020-10-12 MED ORDER — ROCURONIUM BROMIDE 10 MG/ML (PF) SYRINGE
PREFILLED_SYRINGE | INTRAVENOUS | Status: DC | PRN
  Administered 2020-10-12: 100 mg via INTRAVENOUS

## 2020-10-12 MED ORDER — LIDOCAINE-EPINEPHRINE 1 %-1:100000 IJ SOLN
INTRAMUSCULAR | Status: DC | PRN
  Administered 2020-10-12: 5 mL

## 2020-10-12 MED ORDER — POLYETHYLENE GLYCOL 3350 17 G PO PACK: 17.0000 g | PACK | Freq: Every day | ORAL | Status: DC | PRN

## 2020-10-12 MED ORDER — ROCURONIUM BROMIDE 10 MG/ML (PF) SYRINGE
PREFILLED_SYRINGE | INTRAVENOUS | Status: AC
  Filled 2020-10-12: qty 30

## 2020-10-12 MED ORDER — DEXAMETHASONE SODIUM PHOSPHATE 10 MG/ML IJ SOLN
INTRAMUSCULAR | Status: AC
  Filled 2020-10-12: qty 1

## 2020-10-12 MED ORDER — CHLORHEXIDINE GLUCONATE 0.12 % MT SOLN: 15.0000 mL | Freq: Once | OROMUCOSAL | Status: AC

## 2020-10-12 MED ORDER — SUCCINYLCHOLINE CHLORIDE 200 MG/10ML IV SOSY
PREFILLED_SYRINGE | INTRAVENOUS | Status: AC
  Filled 2020-10-12: qty 10

## 2020-10-12 MED ORDER — BUPIVACAINE HCL (PF) 0.5 % IJ SOLN
INTRAMUSCULAR | Status: DC | PRN
  Administered 2020-10-12: 20 mL
  Administered 2020-10-12: 5 mL

## 2020-10-12 MED ORDER — DEXAMETHASONE SODIUM PHOSPHATE 10 MG/ML IJ SOLN
INTRAMUSCULAR | Status: DC | PRN
  Administered 2020-10-12: 10 mg via INTRAVENOUS

## 2020-10-12 MED ORDER — DIPHENHYDRAMINE HCL 50 MG/ML IJ SOLN
INTRAMUSCULAR | Status: DC | PRN
  Administered 2020-10-12: 12.5 mg via INTRAVENOUS

## 2020-10-12 MED ORDER — SODIUM CHLORIDE 0.9% FLUSH
3.0000 mL | Freq: Two times a day (BID) | INTRAVENOUS | Status: DC
  Administered 2020-10-12: 3 mL via INTRAVENOUS

## 2020-10-12 MED ORDER — ONDANSETRON HCL 4 MG/2ML IJ SOLN: 4.0000 mg | Freq: Four times a day (QID) | INTRAMUSCULAR | Status: DC | PRN

## 2020-10-12 MED ORDER — FENTANYL CITRATE (PF) 250 MCG/5ML IJ SOLN
INTRAMUSCULAR | Status: DC | PRN
  Administered 2020-10-12: 100 ug via INTRAVENOUS

## 2020-10-12 MED ORDER — SODIUM CHLORIDE 0.9 % IV SOLN
250.0000 mL | INTRAVENOUS | Status: DC
  Administered 2020-10-12: 250 mL via INTRAVENOUS

## 2020-10-12 MED ORDER — LIDOCAINE 2% (20 MG/ML) 5 ML SYRINGE
INTRAMUSCULAR | Status: DC | PRN
  Administered 2020-10-12: 100 mg via INTRAVENOUS

## 2020-10-12 MED ORDER — PROPOFOL 10 MG/ML IV BOLUS
INTRAVENOUS | Status: DC | PRN
  Administered 2020-10-12: 150 mg via INTRAVENOUS

## 2020-10-12 MED ORDER — LACTATED RINGERS IV SOLN: INTRAVENOUS | Status: DC

## 2020-10-12 MED ORDER — AMLODIPINE BESYLATE 5 MG PO TABS
5.0000 mg | ORAL_TABLET | Freq: Every morning | ORAL | Status: DC
  Administered 2020-10-13: 5 mg via ORAL
  Filled 2020-10-12: qty 1

## 2020-10-12 MED ORDER — METHOCARBAMOL 1000 MG/10ML IJ SOLN
500.0000 mg | Freq: Four times a day (QID) | INTRAVENOUS | Status: DC | PRN
  Filled 2020-10-12: qty 5

## 2020-10-12 MED ORDER — ALBUMIN HUMAN 5 % IV SOLN: INTRAVENOUS | Status: DC | PRN

## 2020-10-12 MED ORDER — PANTOPRAZOLE SODIUM 40 MG PO TBEC
40.0000 mg | DELAYED_RELEASE_TABLET | Freq: Every day | ORAL | Status: DC
  Administered 2020-10-13: 40 mg via ORAL
  Filled 2020-10-12: qty 1

## 2020-10-12 MED ORDER — CELECOXIB 200 MG PO CAPS
ORAL_CAPSULE | ORAL | Status: AC
  Administered 2020-10-12: 200 mg via ORAL
  Filled 2020-10-12: qty 1

## 2020-10-12 MED ORDER — CHLORHEXIDINE GLUCONATE 0.12 % MT SOLN
OROMUCOSAL | Status: AC
  Administered 2020-10-12: 15 mL via OROMUCOSAL
  Filled 2020-10-12: qty 15

## 2020-10-12 MED ORDER — SENNA 8.6 MG PO TABS
1.0000 | ORAL_TABLET | Freq: Two times a day (BID) | ORAL | Status: DC
  Administered 2020-10-12 – 2020-10-13 (×2): 8.6 mg via ORAL
  Filled 2020-10-12 (×2): qty 1

## 2020-10-12 MED ORDER — PROPOFOL 10 MG/ML IV BOLUS
INTRAVENOUS | Status: AC
  Filled 2020-10-12: qty 20

## 2020-10-12 MED ORDER — THROMBIN 5000 UNITS EX SOLR
OROMUCOSAL | Status: DC | PRN
  Administered 2020-10-12: 5 mL via TOPICAL

## 2020-10-12 MED ORDER — MENTHOL 3 MG MT LOZG: 1.0000 | LOZENGE | OROMUCOSAL | Status: DC | PRN

## 2020-10-12 MED ORDER — SUGAMMADEX SODIUM 200 MG/2ML IV SOLN
INTRAVENOUS | Status: DC | PRN
  Administered 2020-10-12: 200 mg via INTRAVENOUS
  Administered 2020-10-12: 100 mg via INTRAVENOUS

## 2020-10-12 MED ORDER — ACETAMINOPHEN 650 MG RE SUPP: 650.0000 mg | RECTAL | Status: DC | PRN

## 2020-10-12 MED ORDER — ACETAMINOPHEN 500 MG PO TABS: 500.0000 mg | ORAL_TABLET | Freq: Four times a day (QID) | ORAL | Status: DC | PRN

## 2020-10-12 MED ORDER — ONDANSETRON HCL 4 MG/2ML IJ SOLN
INTRAMUSCULAR | Status: AC
  Filled 2020-10-12: qty 2

## 2020-10-12 MED ORDER — CHLORHEXIDINE GLUCONATE CLOTH 2 % EX PADS: 6.0000 | MEDICATED_PAD | Freq: Once | CUTANEOUS | Status: DC

## 2020-10-12 MED ORDER — CEFAZOLIN SODIUM-DEXTROSE 2-4 GM/100ML-% IV SOLN
INTRAVENOUS | Status: AC
  Filled 2020-10-12: qty 100

## 2020-10-12 MED ORDER — EPHEDRINE 5 MG/ML INJ
INTRAVENOUS | Status: AC
  Filled 2020-10-12: qty 10

## 2020-10-12 MED ORDER — DOCUSATE SODIUM 100 MG PO CAPS
100.0000 mg | ORAL_CAPSULE | Freq: Two times a day (BID) | ORAL | Status: DC
  Administered 2020-10-12 – 2020-10-13 (×2): 100 mg via ORAL
  Filled 2020-10-12 (×2): qty 1

## 2020-10-12 MED ORDER — ONDANSETRON HCL 4 MG PO TABS: 4.0000 mg | ORAL_TABLET | Freq: Four times a day (QID) | ORAL | Status: DC | PRN

## 2020-10-12 MED ORDER — METHOCARBAMOL 500 MG PO TABS
500.0000 mg | ORAL_TABLET | Freq: Four times a day (QID) | ORAL | Status: DC | PRN
  Administered 2020-10-12 – 2020-10-13 (×2): 500 mg via ORAL
  Filled 2020-10-12 (×2): qty 1

## 2020-10-12 MED ORDER — THROMBIN 5000 UNITS EX SOLR
CUTANEOUS | Status: AC
  Filled 2020-10-12: qty 5000

## 2020-10-12 MED ORDER — ALUM & MAG HYDROXIDE-SIMETH 200-200-20 MG/5ML PO SUSP: 30.0000 mL | Freq: Four times a day (QID) | ORAL | Status: DC | PRN

## 2020-10-12 MED ORDER — LIDOCAINE-EPINEPHRINE 1 %-1:100000 IJ SOLN
INTRAMUSCULAR | Status: AC
  Filled 2020-10-12: qty 1

## 2020-10-12 MED ORDER — FENTANYL CITRATE (PF) 100 MCG/2ML IJ SOLN: 25.0000 ug | INTRAMUSCULAR | Status: DC | PRN

## 2020-10-12 MED ORDER — LIDOCAINE 2% (20 MG/ML) 5 ML SYRINGE
INTRAMUSCULAR | Status: AC
  Filled 2020-10-12: qty 10

## 2020-10-12 MED ORDER — GEMFIBROZIL 600 MG PO TABS
600.0000 mg | ORAL_TABLET | Freq: Two times a day (BID) | ORAL | Status: DC
  Administered 2020-10-12: 600 mg via ORAL
  Filled 2020-10-12 (×4): qty 1

## 2020-10-12 SURGICAL SUPPLY — 50 items
BAND RUBBER #18 3X1/16 STRL (MISCELLANEOUS) IMPLANT
BLADE CLIPPER SURG (BLADE) IMPLANT
BUR ACORN 6.0 (BURR) IMPLANT
BUR MATCHSTICK NEURO 3.0 LAGG (BURR) ×2 IMPLANT
CANISTER SUCT 3000ML PPV (MISCELLANEOUS) ×2 IMPLANT
COVER WAND RF STERILE (DRAPES) ×2 IMPLANT
DECANTER SPIKE VIAL GLASS SM (MISCELLANEOUS) ×2 IMPLANT
DERMABOND ADVANCED (GAUZE/BANDAGES/DRESSINGS) ×1
DERMABOND ADVANCED .7 DNX12 (GAUZE/BANDAGES/DRESSINGS) ×1 IMPLANT
DEVICE DISSECT PLASMABLAD 3.0S (MISCELLANEOUS) ×1 IMPLANT
DRAPE HALF SHEET 40X57 (DRAPES) IMPLANT
DRAPE LAPAROTOMY T 102X78X121 (DRAPES) ×2 IMPLANT
DRAPE MICROSCOPE LEICA (MISCELLANEOUS) IMPLANT
DRSG OPSITE POSTOP 4X6 (GAUZE/BANDAGES/DRESSINGS) ×2 IMPLANT
DURAPREP 26ML APPLICATOR (WOUND CARE) ×2 IMPLANT
ELECT REM PT RETURN 9FT ADLT (ELECTROSURGICAL) ×2
ELECTRODE REM PT RTRN 9FT ADLT (ELECTROSURGICAL) ×1 IMPLANT
GAUZE 4X4 16PLY RFD (DISPOSABLE) IMPLANT
GAUZE SPONGE 4X4 12PLY STRL (GAUZE/BANDAGES/DRESSINGS) ×2 IMPLANT
GLOVE BIOGEL PI IND STRL 8.5 (GLOVE) ×1 IMPLANT
GLOVE BIOGEL PI INDICATOR 8.5 (GLOVE) ×1
GLOVE ECLIPSE 8.5 STRL (GLOVE) ×2 IMPLANT
GLOVE SRG 8 PF TXTR STRL LF DI (GLOVE) ×1 IMPLANT
GLOVE SURG UNDER POLY LF SZ8 (GLOVE) ×1
GLOVE SURG UNDER POLY LF SZ8.5 (GLOVE) ×2 IMPLANT
GOWN STRL REUS W/ TWL LRG LVL3 (GOWN DISPOSABLE) IMPLANT
GOWN STRL REUS W/ TWL XL LVL3 (GOWN DISPOSABLE) ×2 IMPLANT
GOWN STRL REUS W/TWL 2XL LVL3 (GOWN DISPOSABLE) ×2 IMPLANT
GOWN STRL REUS W/TWL LRG LVL3 (GOWN DISPOSABLE)
GOWN STRL REUS W/TWL XL LVL3 (GOWN DISPOSABLE) ×2
HEMOSTAT POWDER KIT SURGIFOAM (HEMOSTASIS) ×2 IMPLANT
KIT BASIN OR (CUSTOM PROCEDURE TRAY) ×2 IMPLANT
KIT TURNOVER KIT B (KITS) ×2 IMPLANT
NEEDLE HYPO 22GX1.5 SAFETY (NEEDLE) ×2 IMPLANT
NEEDLE SPNL 20GX3.5 QUINCKE YW (NEEDLE) IMPLANT
NS IRRIG 1000ML POUR BTL (IV SOLUTION) ×2 IMPLANT
PACK LAMINECTOMY NEURO (CUSTOM PROCEDURE TRAY) ×2 IMPLANT
PAD ARMBOARD 7.5X6 YLW CONV (MISCELLANEOUS) ×6 IMPLANT
PATTIES SURGICAL .5 X1 (DISPOSABLE) ×2 IMPLANT
PLASMABLADE 3.0S (MISCELLANEOUS) ×2
SPONGE SURGIFOAM ABS GEL 100 (HEMOSTASIS) IMPLANT
SPONGE SURGIFOAM ABS GEL SZ50 (HEMOSTASIS) IMPLANT
SUT VIC AB 1 CT1 18XBRD ANBCTR (SUTURE) ×1 IMPLANT
SUT VIC AB 1 CT1 8-18 (SUTURE) ×1
SUT VIC AB 2-0 CP2 18 (SUTURE) ×2 IMPLANT
SUT VIC AB 3-0 SH 8-18 (SUTURE) ×2 IMPLANT
SUT VIC AB 4-0 RB1 18 (SUTURE) ×2 IMPLANT
TOWEL GREEN STERILE (TOWEL DISPOSABLE) ×2 IMPLANT
TOWEL GREEN STERILE FF (TOWEL DISPOSABLE) ×2 IMPLANT
WATER STERILE IRR 1000ML POUR (IV SOLUTION) ×2 IMPLANT

## 2020-10-12 NOTE — Transfer of Care (Signed)
Immediate Anesthesia Transfer of Care Note  Patient: Ronald Haney  Procedure(s) Performed: Laminectomy and Foraminotomy - Lumbar four-Lumbar five - bilateral (Bilateral Back)  Patient Location: PACU  Anesthesia Type:General  Level of Consciousness: drowsy and patient cooperative  Airway & Oxygen Therapy: Patient Spontanous Breathing  Post-op Assessment: Report given to RN, Post -op Vital signs reviewed and stable and Patient moving all extremities X 4  Post vital signs: Reviewed and stable  Last Vitals:  Vitals Value Taken Time  BP 151/74 10/12/20 1407  Temp    Pulse 62 10/12/20 1412  Resp 19 10/12/20 1412  SpO2 99 % 10/12/20 1412  Vitals shown include unvalidated device data.  Last Pain:  Vitals:   10/12/20 0940  TempSrc: Oral         Complications: No complications documented.

## 2020-10-12 NOTE — Anesthesia Procedure Notes (Signed)
Procedure Name: Intubation Performed by: Darletta Moll, CRNA Pre-anesthesia Checklist: Patient identified, Emergency Drugs available, Suction available and Patient being monitored Patient Re-evaluated:Patient Re-evaluated prior to induction Oxygen Delivery Method: Circle system utilized Preoxygenation: Pre-oxygenation with 100% oxygen Induction Type: IV induction Ventilation: Mask ventilation without difficulty Laryngoscope Size: Mac and 4 Tube type: Oral Tube size: 7.5 mm Number of attempts: 1 Airway Equipment and Method: Stylet Placement Confirmation: ETT inserted through vocal cords under direct vision,  positive ETCO2 and breath sounds checked- equal and bilateral Secured at: 22 cm Tube secured with: Tape Dental Injury: Teeth and Oropharynx as per pre-operative assessment

## 2020-10-12 NOTE — Op Note (Signed)
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  Neurogenic claudication.  Lumbar radiculopathy. Postoperative diagnosis: Same Procedure: Bilateral laminotomies and foraminotomies L4-L5 with resection of synovial cyst and decompression of the L4 and L5 nerve roots. Surgeon: Kristeen Miss First Assistant: Newman Pies, MD Anesthesia: General endotracheal Indications: Callie Bunyard is an 87 year old individual who has had significant back and bilateral leg pain on the left worse than the right he was found to have severe spondylitic stenosis secondary to facet hypertrophy and on the left side there was a large facet cyst there is also a smaller cyst forming on the right side contributing to the stenosis.  He was advised regarding the need for surgical decompression of this region.  He did not have a spondylolisthesis.  Procedure: Patient was brought to the operating room supine on the stretcher.  After the smooth induction of general tracheal anesthesia, he was carefully turned prone.  The back was prepped with alcohol DuraPrep and draped in a sterile fashion.  Then a vertical incision was made in the midline of the lumbar spine overlying L4-5.  Dissection was carried down to the lumbodorsal fascia which was opened on either side of the midline.  A series of localizing radiographs identified the L4 laminar arch and then a dissection was carried out removing the inferior margin laminar arch out to the mesial wall of the facet.  A partial medial facetectomy was performed.  Thickened redundant yellow ligament was taken up in this region on the right side there was noted to be such thickening of the L ligament" severe stenosis.  This was taken up and a small medial cyst from the facet joint was identified.  This was removed.  The path of the L4 nerve root required further dissection cephalad to remove thickened and  redundant ligamentous material secondary to that cystic structure.  Once this was removed the path the L4 nerve root superiorly and the L5 nerve root inferiorly could be well decompressed once this was achieved the same procedure was carried out on the opposite side on the left side there was noted to be a large cyst synovial facet cyst this was resected in a piecemeal fashion and ultimately allowed for good decompression of the L4 nerve root superiorly and the L5 nerve root inferiorly.  Once the nerve roots were well decompressed and the canal was well decompressed hemostasis was carefully checked and after infiltrating the area with 20 cc of half percent Marcaine solution into the paraspinous muscle and fascia the retractor was removed lumbodorsal fascia was reapproximated with #1 Vicryl 2-0 Vicryl was used in subcutaneous tissues 3-0 Vicryl subcuticularly along with some final closure of 4-0 Vicryl subcuticular.  The patient tolerated procedure well blood loss was estimated 50 cc.

## 2020-10-12 NOTE — H&P (Signed)
  CHIEF COMPLAINT: Back and leg pain, left worse than right.  HISTORY OF PRESENT ILLNESS: [name] is an 87 year old right-handed individual who notes that for nearly the past year, starting in July of last year, he developed issues with his back and increasing back pain.  He was seen at the [place], and ultimately they discussed that he had spinal stenosis, and he had some bulging discs.  They noted that he had some cyst formation in his back.  He had an MRI of the lumbar spine that was performed on January 4 of this year, and this study demonstrates that the patient has evidence of some moderate disc degenerative changes at multiple levels, but at the singular level of L4-5, he has advanced spinal stenosis with a combination of facet arthropathy and a rather large left-sided synovial cyst.  On the right side, there is a smaller synovial cyst that is forming, and this creates a rather severe central stenosis and lateral recess stenosis for both L5 nerve roots.  Just recently, Ronald Haney tells me that he underwent injection done at L4-L5 bilaterally via the transforaminal route.  He notes that this has not really yielded much relief.  He is concerned because of an overall feeling of weakness in his legs and back pain that has been unrelenting, along with leg pain that is getting progressively worse.  He is seen now for further evaluation and intervention about this process.  PAST MEDICAL HISTORY: Has been generally fairly good.  He does have a history of stroke in the past, and he has been started on clopidogrel 75 mg for that.  He also takes atorvastatin, has used some diclofenac topical gel, fluorouracil topical cream, gabapentin, lisinopril, fish oil, and omeprazole.  PHYSICAL EXAMINATION: I note that he will stand with a slight forward stoop.  He walks with a moderate wide-based gat, and he demonstrates some weakness in the tibialis anterior of his gait on the left side.  Further exam reveals 4/5  strength in the tibialis anterior on the left side.  The right side is strong to 5/5.  His reflexes are absent in both patellae and Achilles.  IMPRESSION: Patient has evidence of some advanced spondylotic changes with stenosis that is high grade at the L4-L5 level.  I demonstrated the findings to the patient and his son.  I noted that ultimately I believe he should undergo surgical decompression of the L4-5 level via bilateral laminotomies.  This surgery should be tolerable.  It does bear some risk given the fact he has a history of stroke and has been on a blood thinner.  He would have to be off the Plavix for a period of about 7 days before surgery and then for about 5 days afterward.  I would also like to have him off the fish oil supplements.  That not withstanding, I believe that he should do well having this area decompressed and be able to resume more normal activities after about 6-8 weeks of recuperation.  At this point, he notes that the epidural injections have not helped at all, and I believe, given the mechanical nature of the stenosis, surgical decompression is a reasonable procedure for him to consider.  We will plan on scheduling this at the earliest convenience.

## 2020-10-12 NOTE — Anesthesia Postprocedure Evaluation (Signed)
Anesthesia Post Note  Patient: Ronald Haney  Procedure(s) Performed: Laminectomy and Foraminotomy - Lumbar four-Lumbar five - bilateral (Bilateral Back)     Patient location during evaluation: PACU Anesthesia Type: General Level of consciousness: sedated Pain management: pain level controlled Vital Signs Assessment: post-procedure vital signs reviewed and stable Respiratory status: spontaneous breathing and respiratory function stable Cardiovascular status: stable Postop Assessment: no apparent nausea or vomiting Anesthetic complications: no   No complications documented.  Last Vitals:  Vitals:   10/12/20 1455 10/12/20 1537  BP: 139/66 (!) 169/78  Pulse: (!) 55 60  Resp: 17 16  Temp: 36.5 C (!) 36.4 C  SpO2: 96% 99%    Last Pain:  Vitals:   10/12/20 1537  TempSrc: Oral  PainSc:                  Lauryl Seyer DANIEL

## 2020-10-13 ENCOUNTER — Encounter (HOSPITAL_COMMUNITY): Payer: Self-pay | Admitting: Neurological Surgery

## 2020-10-13 DIAGNOSIS — M48062 Spinal stenosis, lumbar region with neurogenic claudication: Secondary | ICD-10-CM | POA: Diagnosis not present

## 2020-10-13 DIAGNOSIS — M7138 Other bursal cyst, other site: Secondary | ICD-10-CM | POA: Diagnosis not present

## 2020-10-13 DIAGNOSIS — M4726 Other spondylosis with radiculopathy, lumbar region: Secondary | ICD-10-CM | POA: Diagnosis not present

## 2020-10-13 MED ORDER — OXYCODONE-ACETAMINOPHEN 5-325 MG PO TABS: 1.0000 | ORAL_TABLET | ORAL | 0 refills | Status: DC | PRN

## 2020-10-13 MED ORDER — METHOCARBAMOL 500 MG PO TABS: 500.0000 mg | ORAL_TABLET | Freq: Four times a day (QID) | ORAL | 0 refills | Status: DC | PRN

## 2020-10-13 NOTE — Discharge Instructions (Signed)
Wound Care Remove outer dressing in 2-3 days Leave incision open to air. You may shower. Do not scrub directly on incision.  Do not put any creams, lotions, or ointments on incision. Activity Walk each and every day, increasing distance each day. No lifting greater than 5 lbs.  Avoid bending, Lifting, and twisting. No driving for 2 weeks; may ride as a passenger locally.  Diet Resume your normal diet.   Call Your Doctor If Any of These Occur Redness, drainage, or swelling at the wound.  Temperature greater than 101 degrees. Severe pain not relieved by pain medication. Incision starts to come apart.  Follow Up Appt. Call  [phone])  for problems.

## 2020-10-13 NOTE — Progress Notes (Signed)
Occupational Therapy Evaluation Patient Details Name: Ronald Haney MRN: [id number] DOB: [birth year] Today's Date: 10/13/2020    History of Present Illness [name] is an 87 year old male who presents s/p L4-5 Laminectomy and Foraminotomy 10/12/20. PMh includes HTN and CVA.   Clinical Impression   Ronald Haney is pleasant and adheres to his back precautions well. Ronald Haney was indep in all ADLs/IADLs prior to admission, ambulated with cane, drove, and gardens with his wife. Ronald Haney completed all ADLs with supervision/min A for verbal cues to adhere to back precautions, and was without AD. Pt educated on compensatory techniques and demonstrated understanding with use of reacher and body mechanics. Ronald Haney would benefit from a shower chair for energy conservation and safety upon discharge home. No OT follow up necessary. D/c to home with intermittent supervision for functional mobility and ADLs initally.     Follow Up Recommendations  No OT follow up    Equipment Recommendations  Tub/shower seat;Other (comment) (reacher)       Precautions / Restrictions Precautions Precautions: Back Precaution Booklet Issued: Yes (comment) Precaution Comments: Reviewed precautions Restrictions Weight Bearing Restrictions: No      Mobility Bed Mobility Overal bed mobility: Modified Independent             General bed mobility comments: Cues for log roll technique, HOB flat, no use of rails to simulate home.    Transfers Overall transfer level: Needs assistance Equipment used: None Transfers: Sit to/from Stand Sit to Stand: Supervision         General transfer comment: Supervision for safety. Stood from [employer], transferred to chair post ambulation.    Balance Overall balance assessment: Needs assistance Sitting-balance support: Feet supported;No upper extremity supported Sitting balance-Ronald Haney Scale: Good     Standing balance support: During functional activity     ADL either performed or  assessed with clinical judgement   ADL Overall ADL's : Needs assistance/impaired Eating/Feeding: Modified independent   Grooming: Wash/dry hands;Wash/dry face;Oral care;Applying deodorant;Supervision/safety;Standing;Cueing for compensatory techniques   Upper Body Bathing: Supervision/ safety;Sitting   Lower Body Bathing: Minimal assistance;Cueing for compensatory techniques;Cueing for back precautions;Sit to/from stand   Upper Body Dressing : Supervision/safety   Lower Body Dressing: Minimal assistance;Cueing for compensatory techniques;Cueing for back precautions;Bed level   Toilet Transfer: Supervision/safety;Comfort height toilet   Toileting- Clothing Manipulation and Hygiene: Supervision/safety;Sit to/from stand;Cueing for compensatory techniques   Tub/ Shower Transfer: Supervision/safety;Shower seat   Functional mobility during ADLs: Supervision/safety General ADL Comments: Ronald Haney required cues to adhere to back precautions during ADLs                  Pertinent Vitals/Pain Pain Assessment: 0-10 Pain Score: 1  Pain Location: lower back Pain Descriptors / Indicators: Discomfort;Sore;Operative site guarding Pain Intervention(s): Monitored during session;Repositioned     Hand Dominance Right   Extremity/Trunk Assessment Upper Extremity Assessment Upper Extremity Assessment: Defer to OT evaluation   Lower Extremity Assessment Lower Extremity Assessment: Overall WFL for tasks assessed   Cervical / Trunk Assessment Cervical / Trunk Assessment: Other exceptions Cervical / Trunk Exceptions: s/p back surgery   Communication Communication Communication: No difficulties   Cognition Arousal/Alertness: Awake/alert Behavior During Therapy: WFL for tasks assessed/performed Overall Cognitive Status: Within Functional Limits for tasks assessed               General Comments  Bandage- clean, dry and intact.            Home Living Family/patient expects to be  discharged to:: Private residence Living  Arrangements: Spouse/significant other Available Help at Discharge: Family;Available 24 hours/day Type of Home: House Home Access: Stairs to enter [employer] of Steps: 3 Entrance Stairs-Rails: Right;Left Home Layout: One level     Bathroom Shower/Tub: Producer, television/film/video: Handicapped height     Home Equipment: Environmental consultant - 2 wheels;Cane - single point;Toilet riser;Grab bars - tub/shower          Prior Functioning/Environment Level of Independence: Independent with assistive device(s)        Comments: Pt uses SPC for ambulation PRN. Loves to work in his garden.        OT Problem List: Decreased strength;Decreased range of motion;Decreased activity tolerance;Impaired balance (sitting and/or standing);Decreased knowledge of use of DME or AE;Decreased knowledge of precautions;Pain      OT Treatment/Interventions:      OT Goals(Current goals can be found in the care plan section) Acute Rehab OT Goals Patient Stated Goal: to get back to gardening   AM-PAC OT "6 Clicks" Daily Activity     Outcome Measure Help from another person eating meals?: None Help from another person taking care of personal grooming?: A Little Help from another person toileting, which includes using toliet, bedpan, or urinal?: A Little Help from another person bathing (including washing, rinsing, drying)?: A Little Help from another person to put on and taking off regular upper body clothing?: A Little Help from another person to put on and taking off regular lower body clothing?: A Little 6 Click Score: 19   End of Session Equipment Utilized During Treatment: Other (comment) Lexicographer) Nurse Communication: Mobility status  Activity Tolerance: Patient tolerated treatment well Patient left: in chair;with call bell/phone within reach  OT Visit Diagnosis: Unsteadiness on feet (R26.81);Muscle weakness (generalized) (M62.81);Pain Pain -  Right/Left:  (back) Pain - part of body:  (back)                Time: 0840-0900 OT Time Calculation (min): 20 min Charges:  OT General Charges $OT Visit: 1 Visit OT Evaluation $OT Eval Low Complexity: 1 Low    [name] 10/13/2020, 10:37 AM

## 2020-10-13 NOTE — Progress Notes (Signed)
Patient alert and oriented, voiding adequately, MAE well with no difficulty. Incision area cdi with no s/s of infection. Patient discharged home per order. Patient and son stated understanding of discharge instructions given. Patient has an appointment with Dr. Ellene Route

## 2020-10-13 NOTE — Evaluation (Signed)
Physical Therapy Evaluation Patient Details Name: Ronald Haney MRN: [id number] DOB: [birth year] Today's Date: 10/13/2020   History of Present Illness  [name] is an 87 year old male who presents s/p L4-5 Laminectomy and Foraminotomy 10/12/20. PMh includes HTN and CVA.  Clinical Impression  Patient presents with pain and post surgical deficits s/p above surgery. Pt lives at home with wife and reports using [facility] for ambulation at baseline. Pt loves to work in his garden and reports no falls. Today, pt tolerated bed mobility, transfers and gait training with supervision for safety. Encouraged use of RW at home initially for safety. Pt agreeable. Tolerated stair training with supervision and cues for safety as well. Education re: back precautions, log roll technique, car transfer, walking program, positioning etc. All education completed. Discharge from therapy as pt functioning at supervision level and has wife at home.    Follow Up Recommendations No PT follow up    Equipment Recommendations  None recommended by PT    Recommendations for Other Services       Precautions / Restrictions Precautions Precautions: Back Precaution Booklet Issued: Yes (comment) Precaution Comments: Reviewed precautions Restrictions Weight Bearing Restrictions: No      Mobility  Bed Mobility Overal bed mobility: Modified Independent             General bed mobility comments: Cues for log roll technique, HOB flat, no use of rails to simulate home.    Transfers Overall transfer level: Needs assistance Equipment used: None Transfers: Sit to/from Stand Sit to Stand: Supervision         General transfer comment: Supervision for safety. Stood from [employer], transferred to chair post ambulation.  Ambulation/Gait Ambulation/Gait assistance: Supervision Gait Distance (Feet): 200 Feet Assistive device: Straight cane Gait Pattern/deviations: Step-through pattern;Decreased stride length;Drifts  right/left Gait velocity: decreased   General Gait Details: Slow, mildly unsteady gait with SPC for support; no overt LOB but mild drifting noted.  Stairs Stairs: Yes Stairs assistance: Supervision Stair Management: One rail Left Number of Stairs: 3 General stair comments: Cues for technique/safety.  Wheelchair Mobility    Modified Rankin (Stroke Patients Only)       Balance Overall balance assessment: Needs assistance Sitting-balance support: Feet supported;No upper extremity supported Sitting balance-Leahy Scale: Good     Standing balance support: During functional activity Standing balance-Leahy Scale: Fair Standing balance comment: Does better with UE support but able to stand statically wtihout support.                             Pertinent Vitals/Pain Pain Assessment: 0-10 Pain Score: 1  Pain Location: lower back Pain Descriptors / Indicators: Discomfort;Sore;Operative site guarding Pain Intervention(s): Monitored during session;Repositioned    Home Living Family/patient expects to be discharged to:: Private residence Living Arrangements: Spouse/significant other Available Help at Discharge: Family;Available 24 hours/day Type of Home: House Home Access: Stairs to enter Entrance Stairs-Rails: Psychiatric nurse of Steps: 3 Home Layout: One level Home Equipment: Walker - 2 wheels;Cane - single point;Toilet riser;Grab bars - tub/shower      Prior Function Level of Independence: Independent with assistive device(s)         Comments: Pt uses SPC for ambulation PRN. Loves to work in his garden.     Hand Dominance   Dominant Hand: Right    Extremity/Trunk Assessment   Upper Extremity Assessment Upper Extremity Assessment: Defer to OT evaluation    Lower Extremity Assessment Lower Extremity  Assessment: Overall WFL for tasks assessed    Cervical / Trunk Assessment Cervical / Trunk Assessment: Other exceptions Cervical /  Trunk Exceptions: s/p back surgery  Communication   Communication: No difficulties  Cognition Arousal/Alertness: Awake/alert Behavior During Therapy: WFL for tasks assessed/performed Overall Cognitive Status: Within Functional Limits for tasks assessed                                        General Comments General comments (skin integrity, edema, etc.): Bandage- clean, dry and intact.    Exercises     Assessment/Plan    PT Assessment Patent does not need any further PT services  PT Problem List         PT Treatment Interventions      PT Goals (Current goals can be found in the Care Plan section)  Acute Rehab PT Goals Patient Stated Goal: to get back to gardening PT Goal Formulation: All assessment and education complete, DC therapy    Frequency     Barriers to discharge        Co-evaluation               AM-PAC PT "6 Clicks" Mobility  Outcome Measure Help needed turning from your back to your side while in a flat bed without using bedrails?: None Help needed moving from lying on your back to sitting on the side of a flat bed without using bedrails?: None Help needed moving to and from a bed to a chair (including a wheelchair)?: A Little Help needed standing up from a chair using your arms (e.g., wheelchair or bedside chair)?: A Little Help needed to walk in hospital room?: A Little Help needed climbing 3-5 steps with a railing? : A Little 6 Click Score: 20    End of Session Equipment Utilized During Treatment: Gait belt Activity Tolerance: Patient tolerated treatment well Patient left: in chair;with call bell/phone within reach Nurse Communication: Mobility status PT Visit Diagnosis: Pain;Difficulty in walking, not elsewhere classified (R26.2) Pain - part of body:  (back)    Time: 9528-4132 PT Time Calculation (min) (ACUTE ONLY): 25 min   Charges:   PT Evaluation $PT Eval Moderate Complexity: 1 Mod PT Treatments $Gait Training:  8-22 mins        Marisa Severin, PT, DPT Acute Rehabilitation Services Pager [phone] Office [phone]      Marguarite Arbour A Rogers 10/13/2020, 10:25 AM

## 2020-10-13 NOTE — Plan of Care (Signed)
Adequately ready for discharge 

## 2020-10-14 ENCOUNTER — Encounter (HOSPITAL_COMMUNITY): Payer: Self-pay | Admitting: Neurological Surgery

## 2020-10-16 ENCOUNTER — Other Ambulatory Visit: Payer: Self-pay

## 2020-10-16 ENCOUNTER — Emergency Department
Admission: EM | Admit: 2020-10-16 | Discharge: 2020-10-16 | Disposition: A | Discharge status: Home / Self Care | Payer: Medicare HMO | Attending: Physician Assistant | Admitting: Physician Assistant

## 2020-10-16 DIAGNOSIS — S51811A Laceration without foreign body of right forearm, initial encounter: Secondary | ICD-10-CM | POA: Diagnosis not present

## 2020-10-16 DIAGNOSIS — Z85828 Personal history of other malignant neoplasm of skin: Secondary | ICD-10-CM | POA: Diagnosis not present

## 2020-10-16 DIAGNOSIS — X58XXXA Exposure to other specified factors, initial encounter: Secondary | ICD-10-CM | POA: Diagnosis not present

## 2020-10-16 DIAGNOSIS — Z7982 Long term (current) use of aspirin: Secondary | ICD-10-CM | POA: Diagnosis not present

## 2020-10-16 DIAGNOSIS — Z7902 Long term (current) use of antithrombotics/antiplatelets: Secondary | ICD-10-CM | POA: Insufficient documentation

## 2020-10-16 DIAGNOSIS — S59911A Unspecified injury of right forearm, initial encounter: Secondary | ICD-10-CM | POA: Diagnosis present

## 2020-10-16 DIAGNOSIS — Z79899 Other long term (current) drug therapy: Secondary | ICD-10-CM | POA: Diagnosis not present

## 2020-10-16 DIAGNOSIS — I10 Essential (primary) hypertension: Secondary | ICD-10-CM | POA: Insufficient documentation

## 2020-10-16 DIAGNOSIS — Z8673 Personal history of transient ischemic attack (TIA), and cerebral infarction without residual deficits: Secondary | ICD-10-CM | POA: Insufficient documentation

## 2020-10-16 NOTE — Discharge Instructions (Addendum)
Received bandage in place for 2 days.  Remove in 2 days using warm water.  Cover with bandage.

## 2020-10-16 NOTE — ED Provider Notes (Signed)
[facility] Emergency Department Provider Note   ____________________________________________   Event Date/Time   First MD Initiated Contact with Patient 10/16/20 1502     (approximate)  I have reviewed the triage vital signs and the nursing notes.   HISTORY  Chief Complaint skin tear    HPI Ronald Haney is a 87 y.o. male patient presents with skin tear to right forearm secondary to procedure earlier this week.  Patient states due to blood thinner bleeding is not controlled.  Patient put cornstarch on the wound 2 days ago with no improvement in hemorrhage..  Denies pain associated with complaint.  Denies loss of sensation.         Past Medical History:  Diagnosis Date  . GERD (gastroesophageal reflux disease)   . History of hiatal hernia   . Hyperlipemia   . Hypertension   . Skin cancer    face and arms- squam  . Stroke ([place]) 2019   vision- has returned to 99%    Patient Active Problem List   Diagnosis Date Noted  . Lumbar stenosis with neurogenic claudication 10/12/2020  . Visual disturbance 05/01/2018  . History of stroke 05/01/2018  . HTN (hypertension) 05/01/2018  . HLD (hyperlipidemia) 05/01/2018  . GERD (gastroesophageal reflux disease) 05/01/2018  . CVA (cerebral vascular accident) ([place]) 12/23/2017    Past Surgical History:  Procedure Laterality Date  . ANKLE SURGERY Right   . ccy    . CHOLECYSTECTOMY    . COLONOSCOPY    . LUMBAR LAMINECTOMY/DECOMPRESSION MICRODISCECTOMY Bilateral 10/12/2020   Procedure: Laminectomy and Foraminotomy - Lumbar four-Lumbar five - bilateral;  Surgeon: Kristeen Miss, MD;  Location: [facility];  Service: Neurosurgery;  Laterality: Bilateral;  . TRANSURETHRAL RESECTION OF PROSTATE      Prior to Admission medications   Medication Sig Start Date End Date Taking? Authorizing Provider  acetaminophen (TYLENOL) 500 MG tablet Take 500-1,000 mg by mouth every 6 (six) hours as needed (pain).    [provider]  amLODipine (NORVASC) 5 MG tablet Take 5 mg by mouth in the morning.    [provider]  aspirin 81 MG chewable tablet Chew 81 mg by mouth daily.     [provider]  atorvastatin (LIPITOR) 40 MG tablet Take 1 tablet (40 mg total) by mouth daily. Patient taking differently: Take 40 mg by mouth in the morning. 12/24/17 12/24/18  Dustin Flock, MD  clopidogrel (PLAVIX) 75 MG tablet Take 1 tablet (75 mg total) by mouth daily. Patient taking differently: Take 75 mg by mouth in the morning. 05/04/18   Gladstone Lighter, MD  gemfibrozil (LOPID) 600 MG tablet Take 600 mg by mouth 2 (two) times daily.     [provider]  lisinopril (ZESTRIL) 40 MG tablet Take 40 mg by mouth in the morning.    [provider]  methocarbamol (ROBAXIN) 500 MG tablet Take 1 tablet (500 mg total) by mouth every 6 (six) hours as needed for muscle spasms. 10/13/20   Kristeen Miss, MD  omeprazole (PRILOSEC) 20 MG capsule Take 20 mg by mouth daily before breakfast. 11/20/17   [provider]  oxyCODONE-acetaminophen (PERCOCET/ROXICET) 5-325 MG tablet Take 1-2 tablets by mouth every 4 (four) hours as needed for moderate pain or severe pain. 10/13/20   Kristeen Miss, MD    Allergies Patient has no known allergies.  Family History  Problem Relation Age of Onset  . CAD Father     Social History Social History  Tobacco Use  . Smoking status: Never Smoker  . Smokeless tobacco: Never Used  Vaping Use  . Vaping Use: Never used  Substance Use Topics  . Alcohol use: Not Currently  . Drug use: Not Currently    Review of Systems Constitutional: No fever/chills Eyes: No visual changes. ENT: No sore throat. Cardiovascular: Denies chest pain. Respiratory: Denies shortness of breath. Gastrointestinal: No abdominal pain.  No nausea, no vomiting.  No diarrhea.  No constipation. Genitourinary: Negative for dysuria. Musculoskeletal: Negative for back pain. Skin:  Negative for rash. Neurological: Negative for headaches, but focal left-sided weakness status post CVA.    Endocrine:  Hyperlipidemia and hypertension.   ____________________________________________   PHYSICAL EXAM:  VITAL SIGNS: ED Triage Vitals  Enc Vitals Group     BP 10/16/20 1414 (!) 173/80     Pulse Rate 10/16/20 1414 78     Resp 10/16/20 1414 18     Temp 10/16/20 1416 98.4 F (36.9 C)     Temp Source 10/16/20 1416 Oral     SpO2 10/16/20 1414 97 %     Weight 10/16/20 1414 178 lb (80.7 kg)     Height 10/16/20 1414 5\' 10"  (1.778 m)     Head Circumference --      Peak Flow --      Pain Score 10/16/20 1414 2     Pain Loc --      Pain Edu? --      Excl. in [place]? --     Constitutional: Alert and oriented. Well appearing and in no acute distress. Cardiovascular: Normal rate, regular rhythm. Grossly normal heart sounds.  Good peripheral circulation.  Elevated blood pressure. Respiratory: Normal respiratory effort.  No retractions. Lungs CTAB. Gastrointestinal: Soft and nontender. No distention. No abdominal bruits. No CVA tenderness. Genitourinary: Deferred Musculoskeletal: No lower extremity tenderness nor edema.  No joint effusions. Neurologic:  Normal speech and language. No gross focal neurologic deficits are appreciated. No gait instability. Skin:  Skin is warm, dry and intact. No rash noted.  Skin tear right forearm. Psychiatric: Mood and affect are normal. Speech and behavior are normal.  ____________________________________________   LABS (all labs ordered are listed, but only abnormal results are displayed)  Labs Reviewed - No data to display ____________________________________________  EKG   ____________________________________________  RADIOLOGY I, Sable Feil, personally viewed and evaluated these images (plain radiographs) as part of my medical decision making, as well as reviewing the written report by the radiologist.  ED MD interpretation:     Official radiology report(s): No results found.  ____________________________________________   PROCEDURES  Procedure(s) performed (including Critical Care):  Procedures   ____________________________________________   INITIAL IMPRESSION / ASSESSMENT AND PLAN / ED COURSE  As part of my medical decision making, I reviewed the following data within the [place]        Patient presents with bleeding skin tear for 2 days.  Area was irrigated to remove cornstarch and hemostasis with Surgicel.  Pressure dressing placed and patient advised follow-up PCP.      ____________________________________________   FINAL CLINICAL IMPRESSION(S) / ED DIAGNOSES  Final diagnoses:  Skin tear of right forearm without complication, initial encounter     ED Discharge Orders    None      *Please note:  Ronald Haney was evaluated in Emergency Department on 10/16/2020 for the symptoms described in the history of present illness. He was evaluated in the context of the global COVID-19 pandemic, which  necessitated consideration that the patient might be at risk for infection with the SARS-CoV-2 virus that causes COVID-19. Institutional protocols and algorithms that pertain to the evaluation of patients at risk for COVID-19 are in a state of rapid change based on information released by regulatory bodies including the CDC and federal and state organizations. These policies and algorithms were followed during the patient's care in the ED.  Some ED evaluations and interventions may be delayed as a result of limited staffing during and the pandemic.*   Note:  This document was prepared using Dragon voice recognition software and may include unintentional dictation errors.    Sable Feil, PA-C 10/16/20 1525    Carrie Mew, MD 10/17/20 [phone]

## 2020-10-16 NOTE — ED Triage Notes (Signed)
Pt to ED POV for skin tear to right forearm that happened this week while getting procedure, keeps bleeding. Takes blood thinners. Ambulatory

## 2020-10-16 NOTE — ED Notes (Signed)
Pt states he got a skin tear during procedure turning in the bed, tear on right posterior forearm. Bleeding noted through dressing, pr states he put cornstarch on wound to stop bleeding at home. Pt takes blood thinner, recent back surgery. Redressed tear with gauze pad and gauze wrap. Wound slowly oozing blood, bleeding controlled with dressing at this time. Pt AOX4, NAD noted.

## 2020-10-23 DIAGNOSIS — Z85828 Personal history of other malignant neoplasm of skin: Secondary | ICD-10-CM | POA: Diagnosis not present

## 2020-10-23 DIAGNOSIS — L570 Actinic keratosis: Secondary | ICD-10-CM | POA: Diagnosis not present

## 2020-10-23 DIAGNOSIS — R42 Dizziness and giddiness: Secondary | ICD-10-CM | POA: Diagnosis not present

## 2020-10-23 DIAGNOSIS — K219 Gastro-esophageal reflux disease without esophagitis: Secondary | ICD-10-CM | POA: Diagnosis not present

## 2020-10-23 DIAGNOSIS — D649 Anemia, unspecified: Secondary | ICD-10-CM | POA: Diagnosis not present

## 2020-10-23 DIAGNOSIS — Z8673 Personal history of transient ischemic attack (TIA), and cerebral infarction without residual deficits: Secondary | ICD-10-CM | POA: Diagnosis not present

## 2020-10-23 DIAGNOSIS — I10 Essential (primary) hypertension: Secondary | ICD-10-CM | POA: Diagnosis not present

## 2020-10-23 DIAGNOSIS — M47816 Spondylosis without myelopathy or radiculopathy, lumbar region: Secondary | ICD-10-CM | POA: Diagnosis not present

## 2020-10-23 DIAGNOSIS — Z125 Encounter for screening for malignant neoplasm of prostate: Secondary | ICD-10-CM | POA: Diagnosis not present

## 2020-10-23 DIAGNOSIS — E781 Pure hyperglyceridemia: Secondary | ICD-10-CM | POA: Diagnosis not present

## 2020-10-27 DIAGNOSIS — K219 Gastro-esophageal reflux disease without esophagitis: Secondary | ICD-10-CM | POA: Diagnosis not present

## 2020-10-27 DIAGNOSIS — Z79899 Other long term (current) drug therapy: Secondary | ICD-10-CM | POA: Diagnosis not present

## 2020-10-27 DIAGNOSIS — R972 Elevated prostate specific antigen [PSA]: Secondary | ICD-10-CM | POA: Diagnosis not present

## 2020-10-27 DIAGNOSIS — I10 Essential (primary) hypertension: Secondary | ICD-10-CM | POA: Diagnosis not present

## 2020-10-27 DIAGNOSIS — Z8673 Personal history of transient ischemic attack (TIA), and cerebral infarction without residual deficits: Secondary | ICD-10-CM | POA: Diagnosis not present

## 2020-10-27 DIAGNOSIS — Z0000 Encounter for general adult medical examination without abnormal findings: Secondary | ICD-10-CM | POA: Diagnosis not present

## 2020-12-07 NOTE — Discharge Summary (Addendum)
Discharge summary: Date of admission: 10/12/2020 Date of discharge: 10/13/2020 Admitting diagnosis: Lumbar stenosis L4-L5 with neurogenic claudication, lumbar radiculopathy. Discharge and final diagnosis: Lumbar stenosis L4-L5 with neurogenic claudication, lumbar radiculopathy. Procedure: Bilateral laminotomies and foraminotomies L4-L5 with decompression of synovial cyst, decompression of the L4 and the L5 nerve roots. Condition on discharge: Stable Hospital course: Patient underwent surgical decompression at L4-L5 without difficulty he tolerated surgery well the next day he was ambulated and he was able to be discharged home without complication. Discharge medications: Percocet 5 #30, Robaxin 500 mg #30, Follow-up: 2 weeks in office as outpatient.

## 2020-12-16 ENCOUNTER — Other Ambulatory Visit: Payer: Self-pay

## 2020-12-16 ENCOUNTER — Encounter: Payer: Self-pay | Admitting: Urology

## 2020-12-16 ENCOUNTER — Ambulatory Visit: Payer: Medicare HMO | Admitting: Urology

## 2020-12-16 VITALS — BP 162/80 | HR 60 | Ht 70.0 in | Wt 178.0 lb

## 2020-12-16 DIAGNOSIS — N400 Enlarged prostate without lower urinary tract symptoms: Secondary | ICD-10-CM

## 2020-12-16 DIAGNOSIS — R310 Gross hematuria: Secondary | ICD-10-CM

## 2020-12-16 NOTE — Progress Notes (Signed)
12/16/2020 9:12 AM   Shelbie Ammons Jun 15, 1931 [id number]  Referring provider: Tracie Harrier, MD [address] [facility] [place] [address]  Chief Complaint  Patient presents with   Hematuria   Elevated PSA    HPI: Ronald Haney is an 87 y.o. male referred for a rising PSA and hematuria.  Episode total gross painless hematuria last week Urine described as red without clots and resolved after 48 hours On Plavix which he has held per his PCP No prior history hematuria He had no bothersome LUTS Denies flank, abdominal or pelvic pain No prior tobacco history Prior history TURP Dr. Madelin Headings ~ 15 years ago PSA performed at [facility] 10/23/2020 was 3.03; was 2.0 10/08/2019 and 1.7 09/2018   PMH: Past Medical History:  Diagnosis Date   GERD (gastroesophageal reflux disease)    History of hiatal hernia    Hyperlipemia    Hypertension    Skin cancer    face and arms- squam   Stroke ([place]) 2019   vision- has returned to 99%    Surgical History: Past Surgical History:  Procedure Laterality Date   ANKLE SURGERY Right    ccy     CHOLECYSTECTOMY     COLONOSCOPY     LUMBAR LAMINECTOMY/DECOMPRESSION MICRODISCECTOMY Bilateral 10/12/2020   Procedure: Laminectomy and Foraminotomy - Lumbar four-Lumbar five - bilateral;  Surgeon: Kristeen Miss, MD;  Location: [place];  Service: Neurosurgery;  Laterality: Bilateral;   TRANSURETHRAL RESECTION OF PROSTATE      Home Medications:  Allergies as of 12/16/2020   No Known Allergies      Medication List        Accurate as of December 16, 2020  9:12 AM. If you have any questions, ask your nurse or doctor.          acetaminophen 500 MG tablet Commonly known as: TYLENOL Take 500-1,000 mg by mouth every 6 (six) hours as needed (pain).   amLODipine 5 MG tablet Commonly known as: NORVASC Take 5 mg by mouth in the morning.   aspirin 81 MG chewable tablet Chew 81 mg by mouth daily.   atorvastatin 40 MG  tablet Commonly known as: Lipitor Take 1 tablet (40 mg total) by mouth daily. What changed: when to take this   atorvastatin 80 MG tablet Commonly known as: LIPITOR What changed: Another medication with the same name was changed. Make sure you understand how and when to take each.   clopidogrel 75 MG tablet Commonly known as: PLAVIX Take 1 tablet (75 mg total) by mouth daily. What changed: when to take this   gemfibrozil 600 MG tablet Commonly known as: LOPID Take 600 mg by mouth 2 (two) times daily.   lisinopril 40 MG tablet Commonly known as: ZESTRIL Take 40 mg by mouth in the morning.   methocarbamol 500 MG tablet Commonly known as: ROBAXIN Take 1 tablet (500 mg total) by mouth every 6 (six) hours as needed for muscle spasms.   omeprazole 20 MG capsule Commonly known as: PRILOSEC Take 20 mg by mouth daily before breakfast.   oxyCODONE-acetaminophen 5-325 MG tablet Commonly known as: PERCOCET/ROXICET Take 1-2 tablets by mouth every 4 (four) hours as needed for moderate pain or severe pain.        Allergies: No Known Allergies  Family History: Family History  Problem Relation Age of Onset   CAD Father     Social History:  reports that he has never smoked. He has never used  smokeless tobacco. He reports previous alcohol use. He reports previous drug use.   Physical Exam: BP (!) 162/80   Pulse 60   Ht 5\' 10"  (1.778 m)   Wt 178 lb (80.7 kg)   BMI 25.54 kg/m   Constitutional:  Alert, No acute distress. HEENT: [place] AT, moist mucus membranes.  Trachea midline, no masses. Cardiovascular: No clubbing, cyanosis, or edema. Respiratory: Normal respiratory effort, no increased work of breathing. GI: Abdomen is soft, nontender, nondistended, no abdominal masses GU: Prostate 60+ cc, smooth without nodules Skin: No rashes, bruises or suspicious lesions. Neurologic: Grossly intact, no focal deficits, moving all 4 extremities. Psychiatric: Normal mood and  affect.  Laboratory Data:  Urinalysis Dipstick/microscopy negative  Assessment & Plan:    1.  Gross hematuria AUA risk stratification: High We discussed the recommended evaluation of high risk hematuria to include cystoscopy and CT urogram.  Creatinine April 2022 was 1.1.  The procedures were discussed in detail and he has elected to schedule  2.  BPH without LUTS Benign DRE We discussed the recommended ages of prostate cancer screening are between 31-69 and would not recommend PSA screening at age 17. We discussed discontinuing PSA checks or if desired he can continue since his PSA is slowly rising He would like to think over   Abbie Sons, [place] [address] [place], [place] [address] [phone]

## 2020-12-17 LAB — URINALYSIS, COMPLETE
Bilirubin, UA: NEGATIVE
Glucose, UA: NEGATIVE
Ketones, UA: NEGATIVE
Leukocytes,UA: NEGATIVE
Nitrite, UA: NEGATIVE
Protein,UA: NEGATIVE
RBC, UA: NEGATIVE
Specific Gravity, UA: 1.02 (ref 1.005–1.030)
Urobilinogen, Ur: 0.2 mg/dL (ref 0.2–1.0)
pH, UA: 7 (ref 5.0–7.5)

## 2021-01-06 ENCOUNTER — Other Ambulatory Visit: Payer: Self-pay

## 2021-01-06 ENCOUNTER — Ambulatory Visit
Admission: RE | Admit: 2021-01-06 | Discharge: 2021-01-06 | Disposition: A | Discharge status: Home / Self Care | Payer: Medicare HMO | Source: Ambulatory Visit | Attending: Urology | Admitting: Urology

## 2021-01-06 DIAGNOSIS — R310 Gross hematuria: Secondary | ICD-10-CM | POA: Diagnosis not present

## 2021-01-06 DIAGNOSIS — K449 Diaphragmatic hernia without obstruction or gangrene: Secondary | ICD-10-CM | POA: Diagnosis not present

## 2021-01-06 DIAGNOSIS — I700 Atherosclerosis of aorta: Secondary | ICD-10-CM | POA: Diagnosis not present

## 2021-01-06 DIAGNOSIS — Z9049 Acquired absence of other specified parts of digestive tract: Secondary | ICD-10-CM | POA: Diagnosis not present

## 2021-01-06 LAB — POCT I-STAT CREATININE: Creatinine, Ser: 1.2 mg/dL (ref 0.61–1.24)

## 2021-01-06 MED ORDER — IOHEXOL 300 MG/ML  SOLN: 100.0000 mL | Freq: Once | INTRAMUSCULAR | Status: DC | PRN

## 2021-01-06 MED ORDER — IOHEXOL 300 MG/ML  SOLN
150.0000 mL | Freq: Once | INTRAMUSCULAR | Status: AC | PRN
  Administered 2021-01-06: 125 mL via INTRAVENOUS

## 2021-01-08 ENCOUNTER — Encounter: Payer: Self-pay | Admitting: Urology

## 2021-01-08 ENCOUNTER — Other Ambulatory Visit: Payer: Self-pay

## 2021-01-08 ENCOUNTER — Ambulatory Visit: Payer: Medicare HMO | Admitting: Urology

## 2021-01-08 VITALS — BP 170/73 | HR 71 | Ht 70.0 in | Wt 170.0 lb

## 2021-01-08 DIAGNOSIS — R310 Gross hematuria: Secondary | ICD-10-CM

## 2021-01-08 LAB — URINALYSIS, COMPLETE
Bilirubin, UA: NEGATIVE
Glucose, UA: NEGATIVE
Ketones, UA: NEGATIVE
Leukocytes,UA: NEGATIVE
Nitrite, UA: NEGATIVE
Protein,UA: NEGATIVE
Specific Gravity, UA: 1.015 (ref 1.005–1.030)
Urobilinogen, Ur: 0.2 mg/dL (ref 0.2–1.0)
pH, UA: 6 (ref 5.0–7.5)

## 2021-01-08 LAB — MICROSCOPIC EXAMINATION
Bacteria, UA: NONE SEEN
Epithelial Cells (non renal): NONE SEEN /hpf (ref 0–10)
WBC, UA: NONE SEEN /hpf (ref 0–5)

## 2021-01-08 NOTE — Progress Notes (Signed)
   01/08/21  CC:  Chief Complaint  Patient presents with   Cysto    HPI: Refer to my prior note 12/16/2020.  Denies recurrent gross hematuria.  CT urogram reviewed and no upper tract abnormalities noted  Blood pressure (!) 170/73, pulse 71, height 5\' 10"  (1.778 m), weight 170 lb (77.1 kg). NED. A&Ox3.   No respiratory distress   Abd soft, NT, ND Normal phallus with bilateral descended testicles  Cystoscopy Procedure Note  Patient identification was confirmed, informed consent was obtained, and patient was prepped using Betadine solution.  Lidocaine jelly was administered per urethral meatus.     Pre-Procedure: - Inspection reveals a normal caliber urethral meatus.  Procedure: The flexible cystoscope was introduced without difficulty - No urethral strictures/lesions are present. -Prominent adenoma regrowth prostate with hypervascularity -Normal bladder neck - Bilateral ureteral orifices identified - Bladder mucosa  reveals no ulcers, tumors, or lesions - No bladder stones -Mild trabeculation  Retroflexion shows moderate intravesical median lobe with hypervascularity   Post-Procedure: - Patient tolerated the procedure well  Assessment/ Plan: No bladder mucosal lesions/tumors Gross hematuria most likely prostatic in origin Urine cytology sent Follow-up 32-month repeat UA He states Dr. Ginette Pitman discussed prostate cancer screening.  We discussed the AUA guidelines recommend prostate cancer screening with PSA/DRE between the ages of 70-69.  He had a PSA in April that was 3.03 and would not recommend additional PSA testing    Abbie Sons, MD

## 2021-02-24 DIAGNOSIS — I10 Essential (primary) hypertension: Secondary | ICD-10-CM | POA: Diagnosis not present

## 2021-02-24 DIAGNOSIS — E781 Pure hyperglyceridemia: Secondary | ICD-10-CM | POA: Diagnosis not present

## 2021-02-24 DIAGNOSIS — Z8673 Personal history of transient ischemic attack (TIA), and cerebral infarction without residual deficits: Secondary | ICD-10-CM | POA: Diagnosis not present

## 2021-04-30 DIAGNOSIS — K219 Gastro-esophageal reflux disease without esophagitis: Secondary | ICD-10-CM | POA: Diagnosis not present

## 2021-04-30 DIAGNOSIS — R972 Elevated prostate specific antigen [PSA]: Secondary | ICD-10-CM | POA: Diagnosis not present

## 2021-04-30 DIAGNOSIS — I10 Essential (primary) hypertension: Secondary | ICD-10-CM | POA: Diagnosis not present

## 2021-04-30 DIAGNOSIS — Z8673 Personal history of transient ischemic attack (TIA), and cerebral infarction without residual deficits: Secondary | ICD-10-CM | POA: Diagnosis not present

## 2021-04-30 DIAGNOSIS — L570 Actinic keratosis: Secondary | ICD-10-CM | POA: Diagnosis not present

## 2021-08-27 DIAGNOSIS — E7800 Pure hypercholesterolemia, unspecified: Secondary | ICD-10-CM | POA: Diagnosis not present

## 2021-08-27 DIAGNOSIS — I10 Essential (primary) hypertension: Secondary | ICD-10-CM | POA: Diagnosis not present

## 2021-08-27 DIAGNOSIS — R001 Bradycardia, unspecified: Secondary | ICD-10-CM | POA: Diagnosis not present

## 2021-09-17 DIAGNOSIS — L570 Actinic keratosis: Secondary | ICD-10-CM | POA: Diagnosis not present

## 2021-09-17 DIAGNOSIS — K219 Gastro-esophageal reflux disease without esophagitis: Secondary | ICD-10-CM | POA: Diagnosis not present

## 2021-09-17 DIAGNOSIS — R600 Localized edema: Secondary | ICD-10-CM | POA: Diagnosis not present

## 2021-09-17 DIAGNOSIS — Z8673 Personal history of transient ischemic attack (TIA), and cerebral infarction without residual deficits: Secondary | ICD-10-CM | POA: Diagnosis not present

## 2021-09-17 DIAGNOSIS — I10 Essential (primary) hypertension: Secondary | ICD-10-CM | POA: Diagnosis not present

## 2021-09-17 DIAGNOSIS — Z0000 Encounter for general adult medical examination without abnormal findings: Secondary | ICD-10-CM | POA: Diagnosis not present

## 2022-03-14 DIAGNOSIS — R001 Bradycardia, unspecified: Secondary | ICD-10-CM | POA: Diagnosis not present

## 2022-03-14 DIAGNOSIS — E781 Pure hyperglyceridemia: Secondary | ICD-10-CM | POA: Diagnosis not present

## 2022-03-14 DIAGNOSIS — R609 Edema, unspecified: Secondary | ICD-10-CM | POA: Diagnosis not present

## 2022-03-14 DIAGNOSIS — I10 Essential (primary) hypertension: Secondary | ICD-10-CM | POA: Diagnosis not present

## 2022-03-14 DIAGNOSIS — Z8673 Personal history of transient ischemic attack (TIA), and cerebral infarction without residual deficits: Secondary | ICD-10-CM | POA: Diagnosis not present

## 2022-03-15 DIAGNOSIS — R7309 Other abnormal glucose: Secondary | ICD-10-CM | POA: Diagnosis not present

## 2022-03-15 DIAGNOSIS — Z8673 Personal history of transient ischemic attack (TIA), and cerebral infarction without residual deficits: Secondary | ICD-10-CM | POA: Diagnosis not present

## 2022-03-15 DIAGNOSIS — R600 Localized edema: Secondary | ICD-10-CM | POA: Diagnosis not present

## 2022-03-15 DIAGNOSIS — K219 Gastro-esophageal reflux disease without esophagitis: Secondary | ICD-10-CM | POA: Diagnosis not present

## 2022-03-15 DIAGNOSIS — E538 Deficiency of other specified B group vitamins: Secondary | ICD-10-CM | POA: Diagnosis not present

## 2022-03-15 DIAGNOSIS — I10 Essential (primary) hypertension: Secondary | ICD-10-CM | POA: Diagnosis not present

## 2022-03-15 DIAGNOSIS — L570 Actinic keratosis: Secondary | ICD-10-CM | POA: Diagnosis not present

## 2022-03-22 DIAGNOSIS — D649 Anemia, unspecified: Secondary | ICD-10-CM | POA: Diagnosis not present

## 2022-03-22 DIAGNOSIS — Z8673 Personal history of transient ischemic attack (TIA), and cerebral infarction without residual deficits: Secondary | ICD-10-CM | POA: Diagnosis not present

## 2022-03-22 DIAGNOSIS — R600 Localized edema: Secondary | ICD-10-CM | POA: Diagnosis not present

## 2022-03-22 DIAGNOSIS — E538 Deficiency of other specified B group vitamins: Secondary | ICD-10-CM | POA: Diagnosis not present

## 2022-03-22 DIAGNOSIS — E785 Hyperlipidemia, unspecified: Secondary | ICD-10-CM | POA: Diagnosis not present

## 2022-03-22 DIAGNOSIS — R7309 Other abnormal glucose: Secondary | ICD-10-CM | POA: Diagnosis not present

## 2022-03-22 DIAGNOSIS — K219 Gastro-esophageal reflux disease without esophagitis: Secondary | ICD-10-CM | POA: Diagnosis not present

## 2022-03-22 DIAGNOSIS — L570 Actinic keratosis: Secondary | ICD-10-CM | POA: Diagnosis not present

## 2022-03-22 DIAGNOSIS — I10 Essential (primary) hypertension: Secondary | ICD-10-CM | POA: Diagnosis not present

## 2022-04-05 DIAGNOSIS — E860 Dehydration: Secondary | ICD-10-CM | POA: Diagnosis not present

## 2022-04-05 DIAGNOSIS — K449 Diaphragmatic hernia without obstruction or gangrene: Secondary | ICD-10-CM | POA: Diagnosis not present

## 2022-04-05 DIAGNOSIS — R509 Fever, unspecified: Secondary | ICD-10-CM | POA: Diagnosis not present

## 2022-04-05 DIAGNOSIS — R7401 Elevation of levels of liver transaminase levels: Secondary | ICD-10-CM | POA: Diagnosis not present

## 2022-04-05 DIAGNOSIS — Z1152 Encounter for screening for COVID-19: Secondary | ICD-10-CM | POA: Diagnosis not present

## 2022-04-05 DIAGNOSIS — S61402A Unspecified open wound of left hand, initial encounter: Secondary | ICD-10-CM | POA: Diagnosis not present

## 2022-04-05 DIAGNOSIS — A8489 Other tick-borne viral encephalitis: Secondary | ICD-10-CM | POA: Diagnosis not present

## 2022-04-05 DIAGNOSIS — N179 Acute kidney failure, unspecified: Secondary | ICD-10-CM | POA: Diagnosis not present

## 2022-04-05 DIAGNOSIS — I10 Essential (primary) hypertension: Secondary | ICD-10-CM | POA: Diagnosis not present

## 2022-04-05 DIAGNOSIS — M791 Myalgia, unspecified site: Secondary | ICD-10-CM | POA: Diagnosis not present

## 2022-04-05 DIAGNOSIS — R630 Anorexia: Secondary | ICD-10-CM | POA: Diagnosis not present

## 2022-04-05 DIAGNOSIS — Z23 Encounter for immunization: Secondary | ICD-10-CM | POA: Diagnosis not present

## 2022-04-05 DIAGNOSIS — Z20822 Contact with and (suspected) exposure to covid-19: Secondary | ICD-10-CM | POA: Diagnosis not present

## 2022-04-05 DIAGNOSIS — R5381 Other malaise: Secondary | ICD-10-CM | POA: Diagnosis not present

## 2022-04-05 DIAGNOSIS — R6510 Systemic inflammatory response syndrome (SIRS) of non-infectious origin without acute organ dysfunction: Secondary | ICD-10-CM | POA: Diagnosis not present

## 2022-04-05 DIAGNOSIS — Z03818 Encounter for observation for suspected exposure to other biological agents ruled out: Secondary | ICD-10-CM | POA: Diagnosis not present

## 2022-04-06 DIAGNOSIS — I10 Essential (primary) hypertension: Secondary | ICD-10-CM | POA: Diagnosis not present

## 2022-04-06 DIAGNOSIS — R6510 Systemic inflammatory response syndrome (SIRS) of non-infectious origin without acute organ dysfunction: Secondary | ICD-10-CM | POA: Diagnosis not present

## 2022-04-06 DIAGNOSIS — N179 Acute kidney failure, unspecified: Secondary | ICD-10-CM | POA: Diagnosis not present

## 2022-04-06 DIAGNOSIS — M791 Myalgia, unspecified site: Secondary | ICD-10-CM | POA: Diagnosis not present

## 2022-04-07 DIAGNOSIS — M791 Myalgia, unspecified site: Secondary | ICD-10-CM | POA: Diagnosis not present

## 2022-04-11 DIAGNOSIS — Z09 Encounter for follow-up examination after completed treatment for conditions other than malignant neoplasm: Secondary | ICD-10-CM | POA: Diagnosis not present

## 2022-04-11 DIAGNOSIS — R7989 Other specified abnormal findings of blood chemistry: Secondary | ICD-10-CM | POA: Diagnosis not present

## 2022-04-11 DIAGNOSIS — E860 Dehydration: Secondary | ICD-10-CM | POA: Diagnosis not present

## 2022-04-11 DIAGNOSIS — B349 Viral infection, unspecified: Secondary | ICD-10-CM | POA: Diagnosis not present

## 2022-04-11 DIAGNOSIS — N179 Acute kidney failure, unspecified: Secondary | ICD-10-CM | POA: Diagnosis not present

## 2022-04-14 ENCOUNTER — Other Ambulatory Visit: Payer: Self-pay | Admitting: Family Medicine

## 2022-04-14 DIAGNOSIS — R7989 Other specified abnormal findings of blood chemistry: Secondary | ICD-10-CM

## 2022-04-21 ENCOUNTER — Ambulatory Visit
Admission: RE | Admit: 2022-04-21 | Discharge: 2022-04-21 | Disposition: A | Discharge status: Home / Self Care | Payer: Medicare HMO | Source: Ambulatory Visit | Attending: Family Medicine | Admitting: Family Medicine

## 2022-04-21 DIAGNOSIS — R945 Abnormal results of liver function studies: Secondary | ICD-10-CM | POA: Diagnosis not present

## 2022-04-21 DIAGNOSIS — Z9049 Acquired absence of other specified parts of digestive tract: Secondary | ICD-10-CM | POA: Diagnosis not present

## 2022-04-21 DIAGNOSIS — R7989 Other specified abnormal findings of blood chemistry: Secondary | ICD-10-CM

## 2022-04-27 DIAGNOSIS — R748 Abnormal levels of other serum enzymes: Secondary | ICD-10-CM | POA: Diagnosis not present

## 2022-04-27 DIAGNOSIS — I10 Essential (primary) hypertension: Secondary | ICD-10-CM | POA: Diagnosis not present

## 2022-04-27 DIAGNOSIS — E7800 Pure hypercholesterolemia, unspecified: Secondary | ICD-10-CM | POA: Diagnosis not present

## 2022-07-20 DIAGNOSIS — R399 Unspecified symptoms and signs involving the genitourinary system: Secondary | ICD-10-CM | POA: Diagnosis not present

## 2022-07-20 DIAGNOSIS — Z8673 Personal history of transient ischemic attack (TIA), and cerebral infarction without residual deficits: Secondary | ICD-10-CM | POA: Diagnosis not present

## 2022-07-20 DIAGNOSIS — I10 Essential (primary) hypertension: Secondary | ICD-10-CM | POA: Diagnosis not present

## 2022-07-20 DIAGNOSIS — R310 Gross hematuria: Secondary | ICD-10-CM | POA: Diagnosis not present

## 2022-08-02 ENCOUNTER — Ambulatory Visit: Payer: Medicare HMO | Admitting: Physician Assistant

## 2022-08-02 ENCOUNTER — Encounter: Payer: Self-pay | Admitting: Physician Assistant

## 2022-08-02 VITALS — BP 153/71 | HR 72 | Ht 70.0 in | Wt 176.0 lb

## 2022-08-02 DIAGNOSIS — R310 Gross hematuria: Secondary | ICD-10-CM

## 2022-08-02 LAB — URINALYSIS, COMPLETE
Bilirubin, UA: NEGATIVE
Glucose, UA: NEGATIVE
Ketones, UA: NEGATIVE
Leukocytes,UA: NEGATIVE
Nitrite, UA: NEGATIVE
Protein,UA: NEGATIVE
RBC, UA: NEGATIVE
Specific Gravity, UA: 1.01 (ref 1.005–1.030)
Urobilinogen, Ur: 0.2 mg/dL (ref 0.2–1.0)
pH, UA: 5.5 (ref 5.0–7.5)

## 2022-08-02 LAB — MICROSCOPIC EXAMINATION

## 2022-08-02 LAB — BLADDER SCAN AMB NON-IMAGING: Scan Result: 0

## 2022-08-02 NOTE — Progress Notes (Signed)
08/02/2022 11:17 AM   Shelbie Ammons 11-01-1930 [id number]  CC: Chief Complaint  Patient presents with   Hematuria   HPI: Ronald Haney is a 89 y.o. male with PMH BPH with prostatomegaly not on pharmacotherapy, CVA on Plavix, gross hematuria with benign workup in 2022 with cystoscopy findings of hypervascular prostate who presents today for evaluation of gross hematuria.  He saw his PCP on 07/20/2022 for evaluation of the same.  UA was bland but he was treated with Cipro 500 mg twice daily x 7 days regardless.  Urine culture finalized with no significant growth.  Today he reports he completed antibiotics as prescribed and tolerated them well.  His gross hematuria spontaneously resolved after 1 week.  It was not associated with dysuria or flank pain.  He describes his gross hematuria as pink in color, with some redness with initiation.  He passed small clot fragments as well.  He reports some urinary hesitancy, but denies weak stream, intermittency, incomplete emptying, or straining.  In-office UA and microscopy today pan negative. PVR 33m.  PMH: Past Medical History:  Diagnosis Date   GERD (gastroesophageal reflux disease)    History of hiatal hernia    Hyperlipemia    Hypertension    Skin cancer    face and arms- squam   Stroke ([place] 2019   vision- has returned to 99%    Surgical History: Past Surgical History:  Procedure Laterality Date   ANKLE SURGERY Right    ccy     CHOLECYSTECTOMY     COLONOSCOPY     LUMBAR LAMINECTOMY/DECOMPRESSION MICRODISCECTOMY Bilateral 10/12/2020   Procedure: Laminectomy and Foraminotomy - Lumbar four-Lumbar five - bilateral;  Surgeon: EKristeen Miss MD;  [place] [facility]  Service: Neurosurgery;  Laterality: Bilateral;   TRANSURETHRAL RESECTION OF PROSTATE      Home Medications:  Allergies as of 08/02/2022   No Known Allergies      Medication List        Accurate as of August 02, 2022 11:17 AM. If you have any questions, ask your  nurse or doctor.          acetaminophen 500 MG tablet Commonly known as: TYLENOL Take 500-1,000 mg by mouth every 6 (six) hours as needed (pain).   amLODipine 5 MG tablet Commonly known as: NORVASC Take 5 mg by mouth in the morning.   atorvastatin 40 MG tablet Commonly known as: Lipitor Take 1 tablet (40 mg total) by mouth daily. What changed: when to take this   clopidogrel 75 MG tablet Commonly known as: PLAVIX Take 1 tablet (75 mg total) by mouth daily. What changed: when to take this   gemfibrozil 600 MG tablet Commonly known as: LOPID Take 600 mg by mouth 2 (two) times daily.   lisinopril 40 MG tablet Commonly known as: ZESTRIL Take 40 mg by mouth in the morning.   omeprazole 20 MG capsule Commonly known as: PRILOSEC Take 20 mg by mouth daily before breakfast.        Allergies:  No Known Allergies  Family History: Family History  Problem Relation Age of Onset   CAD Father     Social History:   reports that he has never smoked. He has never used smokeless tobacco. He reports that he does not currently use alcohol. He reports that he does not currently use drugs.  Physical Exam: BP (!) 153/71   Pulse 72   Ht '5\' 10"'$  (1.778 m)   Wt 176 lb (79.8 kg)  BMI 25.25 kg/m   Constitutional:  Alert and oriented, no acute distress, nontoxic appearing HEENT: [place], AT Cardiovascular: No clubbing, cyanosis, or edema Respiratory: Normal respiratory effort, no increased work of breathing Skin: No rashes, bruises or suspicious lesions Neurologic: Grossly intact, no focal deficits, moving all 4 extremities Psychiatric: Normal mood and affect  Laboratory Data: Results for orders placed or performed in visit on 08/02/22  Microscopic Examination   Urine  Result Value Ref Range   WBC, UA 0-5 0 - 5 /hpf   RBC, Urine 0-2 0 - 2 /hpf   Epithelial Cells (non renal) 0-10 0 - 10 /hpf   Bacteria, UA Few None seen/Few  Urinalysis, Complete  Result Value Ref Range    Specific Gravity, UA 1.010 1.005 - 1.030   pH, UA 5.5 5.0 - 7.5   Color, UA Yellow Yellow   Appearance Ur Clear Clear   Leukocytes,UA Negative Negative   Protein,UA Negative Negative/Trace   Glucose, UA Negative Negative   Ketones, UA Negative Negative   RBC, UA Negative Negative   Bilirubin, UA Negative Negative   Urobilinogen, Ur 0.2 0.2 - 1.0 mg/dL   Nitrite, UA Negative Negative   Microscopic Examination See below:   Bladder Scan (Post Void Residual) in office  Result Value Ref Range   Scan Result 0    Assessment & Plan:   1. Gross hematuria Spontaneously resolved.  Based on hematuria workup completed 1.5 years ago, I suspect most likely multifactorial in the setting of prostatomegaly with hypervascular prostate and Plavix use.  UA remains bland today and he is emptying appropriately.  We discussed various options at this point including repeat cystoscopy to rule out other lower tract pathologies that could be contributory versus initiation of finasteride.  He was hesitant to pursue finasteride due to the risk of erectile dysfunction.  I do not think it is necessary to pursue a repeat CT urogram at this point given the recency of his last scan.  Will pursue cystoscopy per patient preference.  Recommend consideration of finasteride to reduce his risk for prostatic bleeding in the future. - Urinalysis, Complete - Bladder Scan (Post Void Residual) in office  Return in about 2 weeks (around 08/16/2022) for Cysto with Dr. Bernardo Heater.  Debroah Loop, PA-C  [facility] [address], [place], [place] [address] [phone]

## 2022-08-25 ENCOUNTER — Encounter: Payer: Self-pay | Admitting: Urology

## 2022-08-25 ENCOUNTER — Ambulatory Visit: Payer: Medicare HMO | Admitting: Urology

## 2022-08-25 VITALS — BP 147/70 | HR 73 | Ht 70.0 in | Wt 175.0 lb

## 2022-08-25 DIAGNOSIS — R310 Gross hematuria: Secondary | ICD-10-CM | POA: Diagnosis not present

## 2022-08-25 DIAGNOSIS — N400 Enlarged prostate without lower urinary tract symptoms: Secondary | ICD-10-CM

## 2022-08-25 LAB — URINALYSIS, COMPLETE
Bilirubin, UA: NEGATIVE
Glucose, UA: NEGATIVE
Ketones, UA: NEGATIVE
Leukocytes,UA: NEGATIVE
Nitrite, UA: NEGATIVE
Protein,UA: NEGATIVE
RBC, UA: NEGATIVE
Specific Gravity, UA: 1.015 (ref 1.005–1.030)
Urobilinogen, Ur: 0.2 mg/dL (ref 0.2–1.0)
pH, UA: 6 (ref 5.0–7.5)

## 2022-08-25 LAB — MICROSCOPIC EXAMINATION: Bacteria, UA: NONE SEEN

## 2022-08-25 MED ORDER — FINASTERIDE 5 MG PO TABS: 5.0000 mg | ORAL_TABLET | Freq: Every day | ORAL | 11 refills | Status: DC

## 2022-08-25 NOTE — Progress Notes (Signed)
   08/25/22  CC:  Chief Complaint  Patient presents with   Cysto    HPI: Refer to Samantha Valcourt's note 08/02/2022  Blood pressure (!) 170/73, pulse 71, height 5' 10"$  (1.778 m), weight 170 lb (77.1 kg). NED. A&Ox3.   No respiratory distress   Abd soft, NT, ND Normal phallus with bilateral descended testicles  Cystoscopy Procedure Note  Patient identification was confirmed, informed consent was obtained, and patient was prepped using Betadine solution.  Lidocaine jelly was administered per urethral meatus.     Pre-Procedure: - Inspection reveals a normal caliber urethral meatus.  Procedure: The flexible cystoscope was introduced without difficulty - No urethral strictures/lesions are present. -Prominent adenoma regrowth prostate with hypervascularity - Normal bladder neck - Bilateral ureteral orifices identified - Bladder mucosa  reveals no ulcers, tumors, or lesions - No bladder stones -Mild trabeculation  Retroflexion shows moderate intravesical median lobe with hypervascularity   Post-Procedure: - Patient tolerated the procedure well  Assessment/ Plan: No bladder mucosal lesions/tumors Gross hematuria most likely prostatic in origin S. Vaillancourt had discussed starting finasteride if cystoscopy showed no bladder mucosal abnormalities and he was interested in starting.  Rx sent to pharmacy 1 year follow-up with UA.  Instructed call earlier for recurrent hematuria   Abbie Sons, MD

## 2022-09-15 DIAGNOSIS — I10 Essential (primary) hypertension: Secondary | ICD-10-CM | POA: Diagnosis not present

## 2022-09-15 DIAGNOSIS — R011 Cardiac murmur, unspecified: Secondary | ICD-10-CM | POA: Diagnosis not present

## 2022-09-15 DIAGNOSIS — R609 Edema, unspecified: Secondary | ICD-10-CM | POA: Diagnosis not present

## 2022-09-15 DIAGNOSIS — E781 Pure hyperglyceridemia: Secondary | ICD-10-CM | POA: Diagnosis not present

## 2022-09-15 DIAGNOSIS — Z8673 Personal history of transient ischemic attack (TIA), and cerebral infarction without residual deficits: Secondary | ICD-10-CM | POA: Diagnosis not present

## 2022-09-20 DIAGNOSIS — E782 Mixed hyperlipidemia: Secondary | ICD-10-CM | POA: Diagnosis not present

## 2022-09-20 DIAGNOSIS — Z125 Encounter for screening for malignant neoplasm of prostate: Secondary | ICD-10-CM | POA: Diagnosis not present

## 2022-09-20 DIAGNOSIS — D649 Anemia, unspecified: Secondary | ICD-10-CM | POA: Diagnosis not present

## 2022-09-20 DIAGNOSIS — R609 Edema, unspecified: Secondary | ICD-10-CM | POA: Diagnosis not present

## 2022-09-20 DIAGNOSIS — I10 Essential (primary) hypertension: Secondary | ICD-10-CM | POA: Diagnosis not present

## 2022-09-20 DIAGNOSIS — Z79899 Other long term (current) drug therapy: Secondary | ICD-10-CM | POA: Diagnosis not present

## 2022-09-20 DIAGNOSIS — H903 Sensorineural hearing loss, bilateral: Secondary | ICD-10-CM | POA: Diagnosis not present

## 2022-09-20 DIAGNOSIS — R7309 Other abnormal glucose: Secondary | ICD-10-CM | POA: Diagnosis not present

## 2022-09-20 DIAGNOSIS — K219 Gastro-esophageal reflux disease without esophagitis: Secondary | ICD-10-CM | POA: Diagnosis not present

## 2022-09-27 DIAGNOSIS — K219 Gastro-esophageal reflux disease without esophagitis: Secondary | ICD-10-CM | POA: Diagnosis not present

## 2022-09-27 DIAGNOSIS — Z0000 Encounter for general adult medical examination without abnormal findings: Secondary | ICD-10-CM | POA: Diagnosis not present

## 2022-09-27 DIAGNOSIS — Z1331 Encounter for screening for depression: Secondary | ICD-10-CM | POA: Diagnosis not present

## 2022-09-27 DIAGNOSIS — I10 Essential (primary) hypertension: Secondary | ICD-10-CM | POA: Diagnosis not present

## 2022-09-27 DIAGNOSIS — R7989 Other specified abnormal findings of blood chemistry: Secondary | ICD-10-CM | POA: Diagnosis not present

## 2022-09-27 DIAGNOSIS — R600 Localized edema: Secondary | ICD-10-CM | POA: Diagnosis not present

## 2022-09-27 DIAGNOSIS — D649 Anemia, unspecified: Secondary | ICD-10-CM | POA: Diagnosis not present

## 2022-09-27 DIAGNOSIS — E785 Hyperlipidemia, unspecified: Secondary | ICD-10-CM | POA: Diagnosis not present

## 2022-09-27 DIAGNOSIS — Z8673 Personal history of transient ischemic attack (TIA), and cerebral infarction without residual deficits: Secondary | ICD-10-CM | POA: Diagnosis not present

## 2022-09-28 DIAGNOSIS — R011 Cardiac murmur, unspecified: Secondary | ICD-10-CM | POA: Diagnosis not present

## 2022-11-29 DIAGNOSIS — I10 Essential (primary) hypertension: Secondary | ICD-10-CM | POA: Diagnosis not present

## 2022-12-11 IMAGING — CR DG LUMBAR SPINE 2-3V
3 series · 3 of 3 positions shown · non-contrast
Comparison: 06/08/2020

CLINICAL DATA: L4-5 laminectomy

EXAM:
LUMBAR SPINE - 2-3 VIEW

[xtable lateral (1 of 3)]
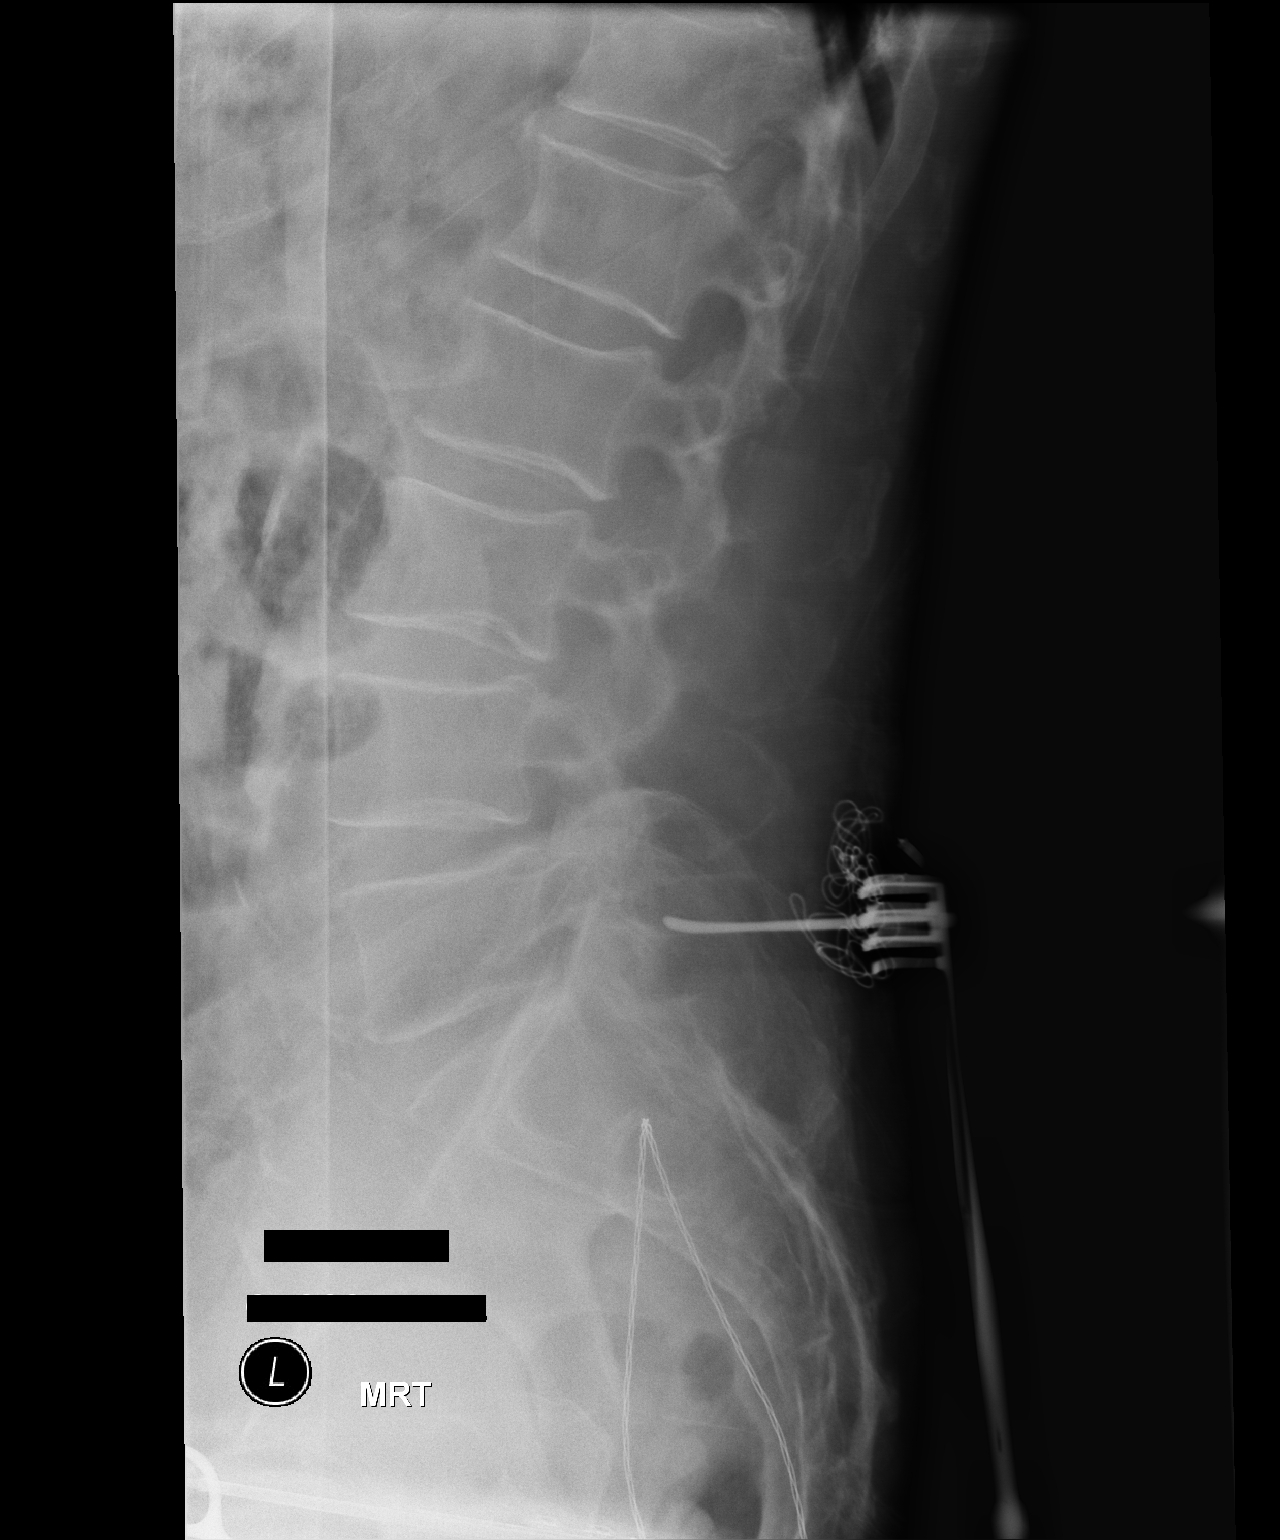

[xtable lateral (2 of 3)]
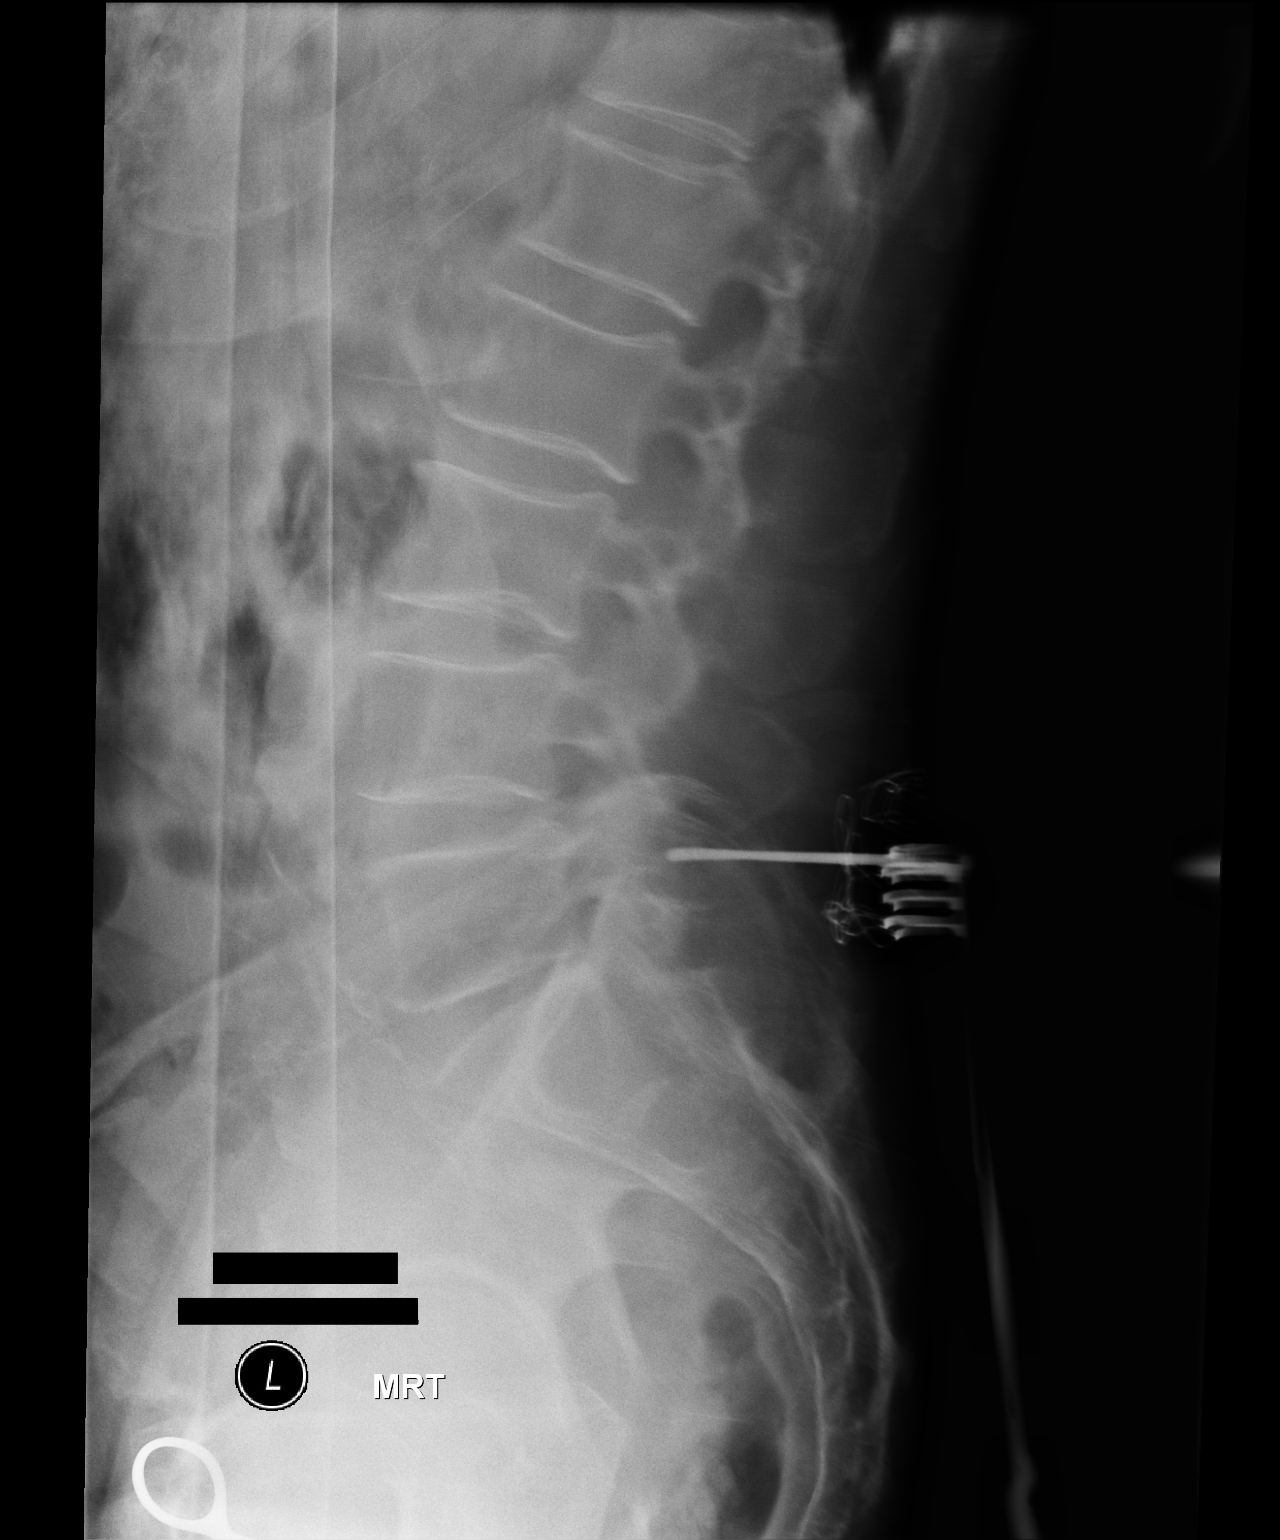

[xtable lateral (3 of 3)]
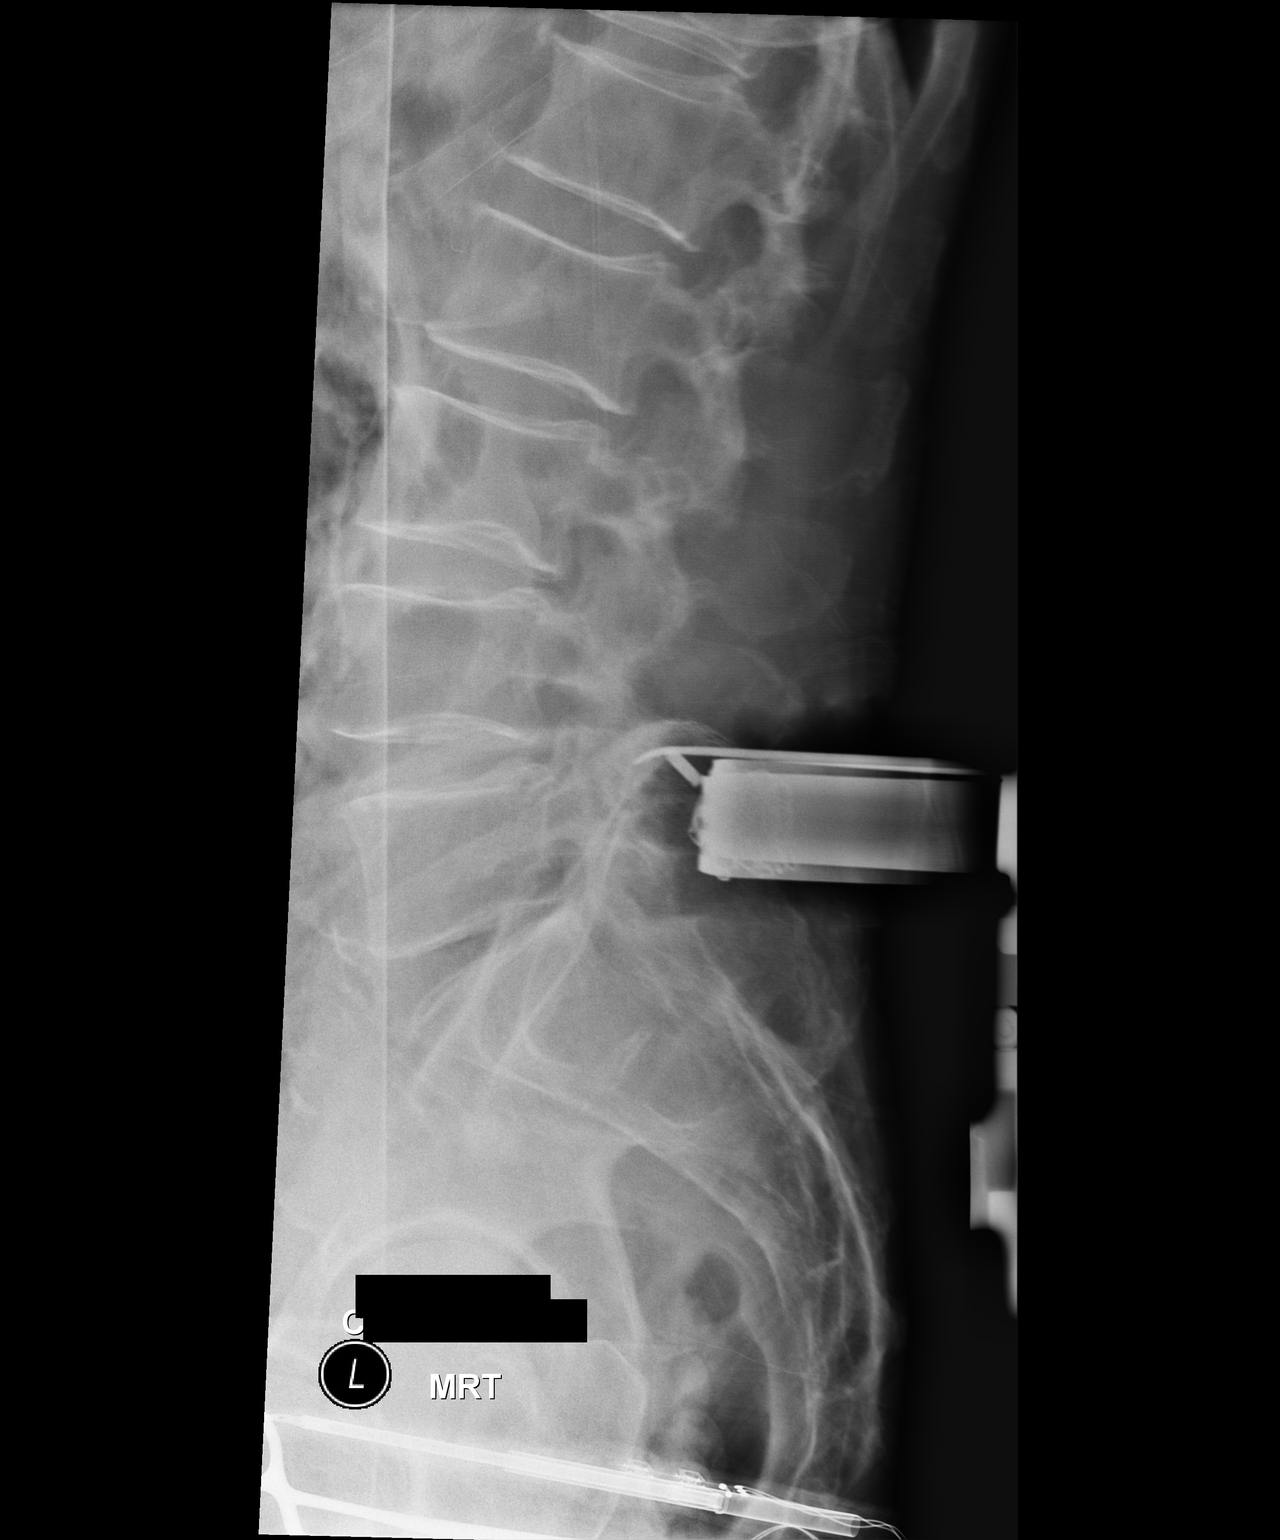

[3 of 3 positions shown; findings below may reference images not displayed]

FINDINGS: First lateral intraoperative image demonstrates posterior surgical
instrument directed at the L5 vertebral body. Second lateral
intraoperative image demonstrates posterior surgical instrument
directed at the L5 pedicles. Third lateral intraoperative image
demonstrates posterior surgical instruments directed at the L4-5
disc space.
IMPRESSION: Intraoperative localization as above.

## 2023-01-24 DIAGNOSIS — E039 Hypothyroidism, unspecified: Secondary | ICD-10-CM | POA: Diagnosis not present

## 2023-01-24 DIAGNOSIS — I509 Heart failure, unspecified: Secondary | ICD-10-CM | POA: Diagnosis not present

## 2023-01-24 DIAGNOSIS — G309 Alzheimer's disease, unspecified: Secondary | ICD-10-CM | POA: Diagnosis not present

## 2023-01-24 DIAGNOSIS — R569 Unspecified convulsions: Secondary | ICD-10-CM | POA: Diagnosis not present

## 2023-01-24 DIAGNOSIS — I639 Cerebral infarction, unspecified: Secondary | ICD-10-CM | POA: Diagnosis not present

## 2023-01-24 DIAGNOSIS — J449 Chronic obstructive pulmonary disease, unspecified: Secondary | ICD-10-CM | POA: Diagnosis not present

## 2023-01-24 DIAGNOSIS — F02B18 Dementia in other diseases classified elsewhere, moderate, with other behavioral disturbance: Secondary | ICD-10-CM | POA: Diagnosis not present

## 2023-03-07 IMAGING — CT CT ABD-PEL WO/W CM
3 of 6 series · 14 of 32 positions shown, 19 images · IV contrast (APPLIED)
Comparison: None.

CLINICAL DATA: Gross hematuria episode 2 weeks ago

EXAM:
CT ABDOMEN AND PELVIS WITHOUT AND WITH CONTRAST
TECHNIQUE: Multidetector CT imaging of the abdomen and pelvis was performed
following the standard protocol before and following the bolus
administration of intravenous contrast.
CONTRAST:  125mL OMNIPAQUE IOHEXOL 300 MG/ML  SOLN

[Series 2: axial pre · axial · non-contrast · 0.81mm/px · z∈[-1159,-834]mm · 6 of 93 slices shown, 11 images]
[im 14/93  soft-tissue]
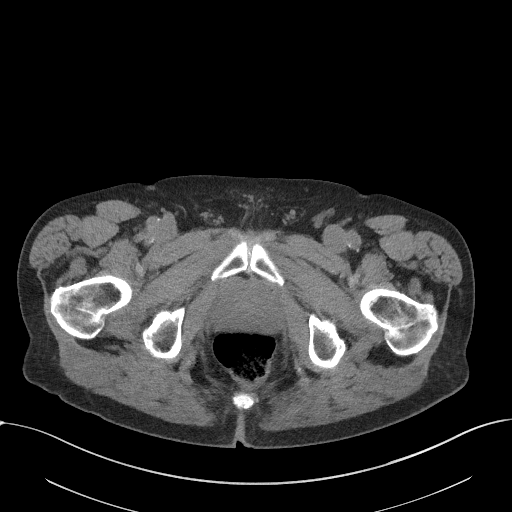
[im 14/93  bone]
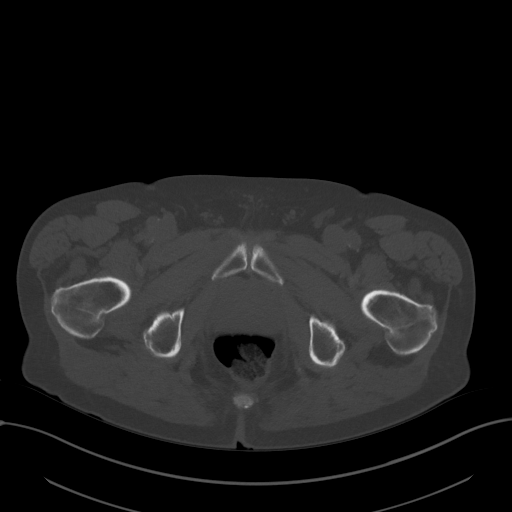
[im 27/93  soft-tissue]
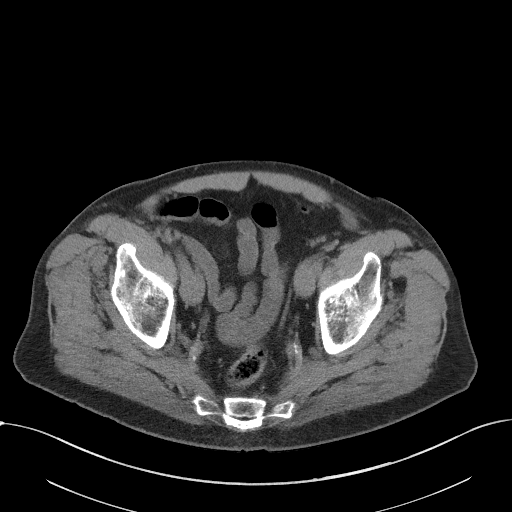
[im 40/93  soft-tissue]
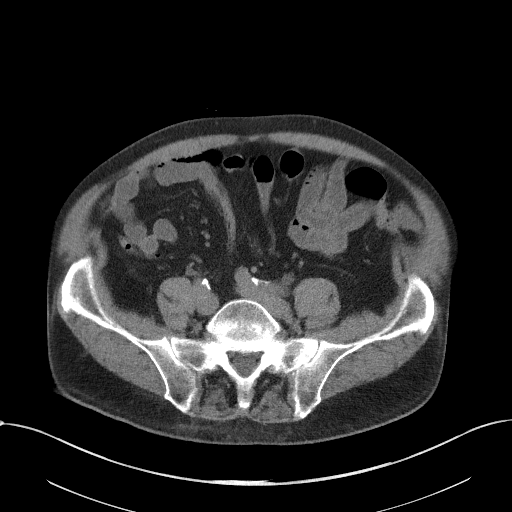
[im 40/93  lung]
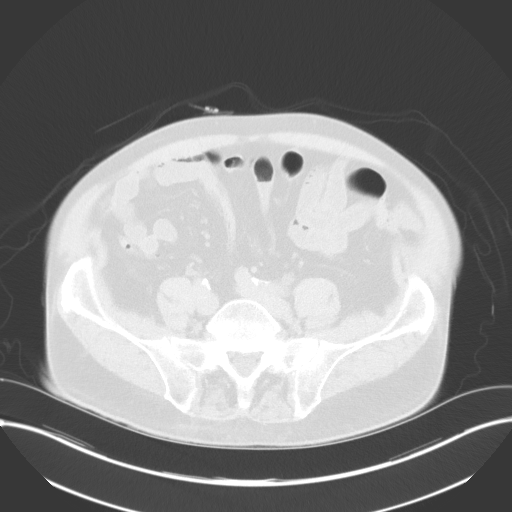
[im 53/93  soft-tissue]
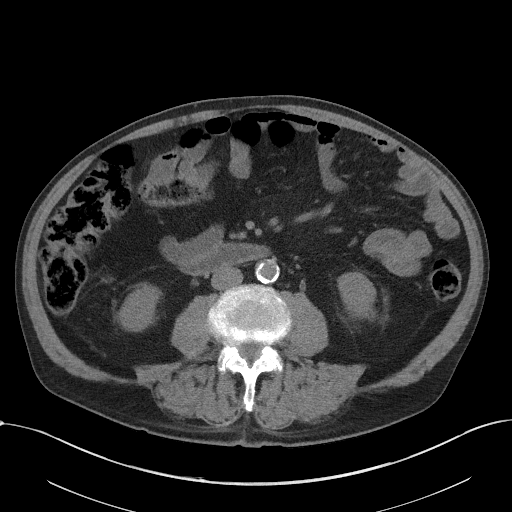
[im 53/93  lung]
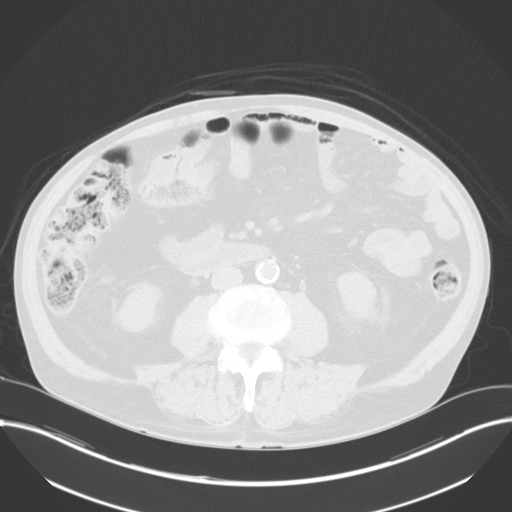
[im 66/93  soft-tissue]
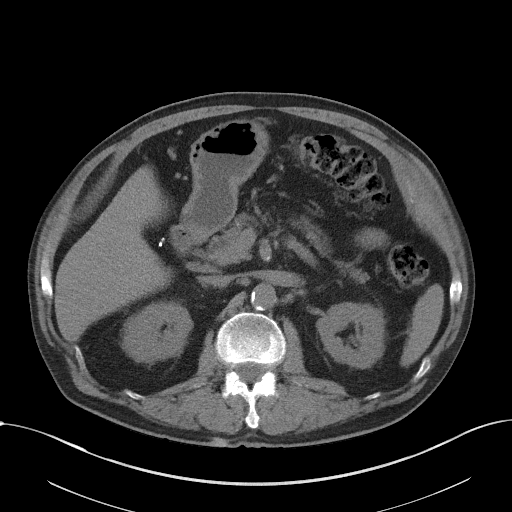
[im 66/93  lung]
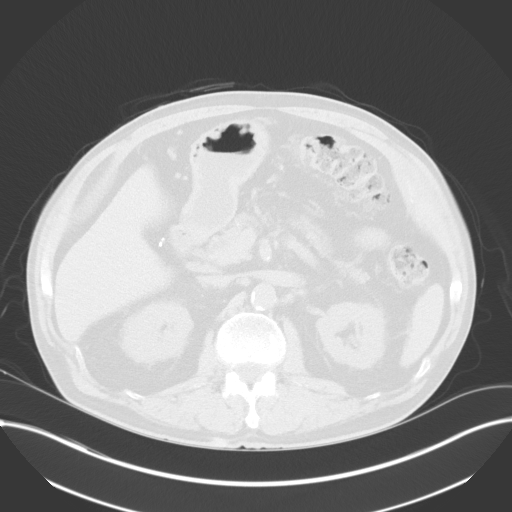
[im 79/93  soft-tissue]
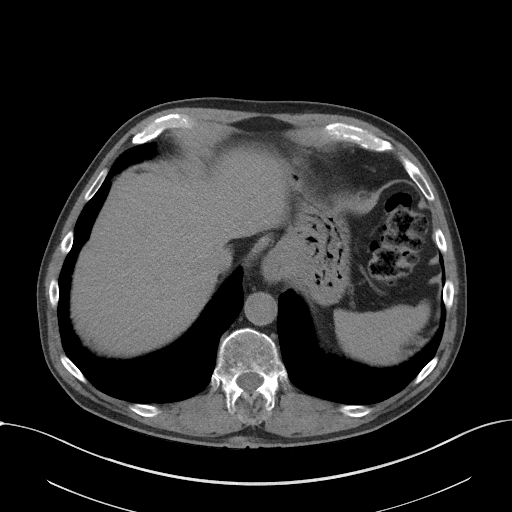
[im 79/93  lung]
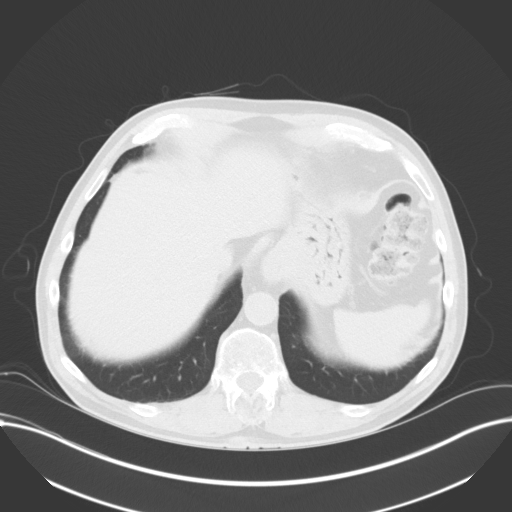

[Series 5: axial post · axial · 0.81mm/px · z∈[-1169,-834]mm · 6 of 95 slices shown]
[im 14/95  soft-tissue]
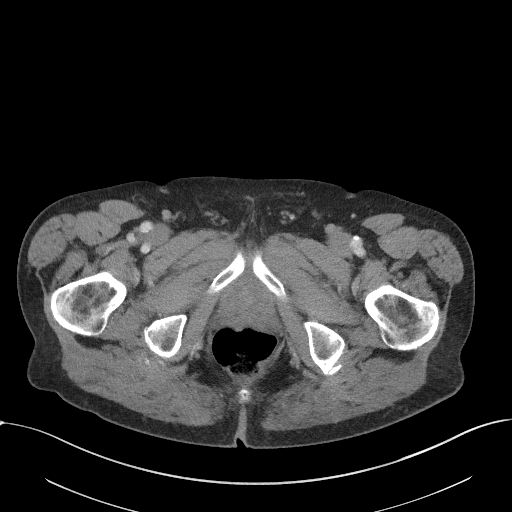
[im 27/95  soft-tissue]
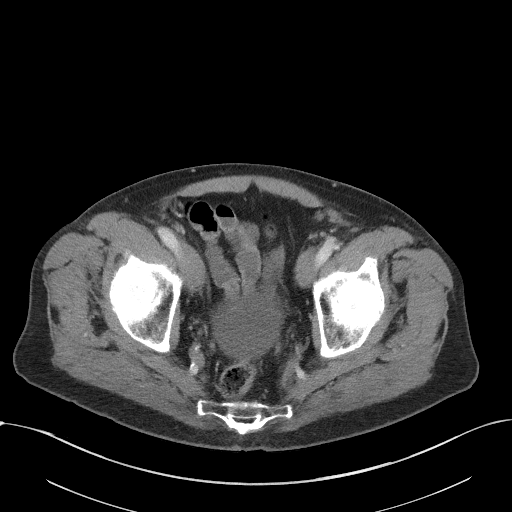
[im 41/95  soft-tissue]
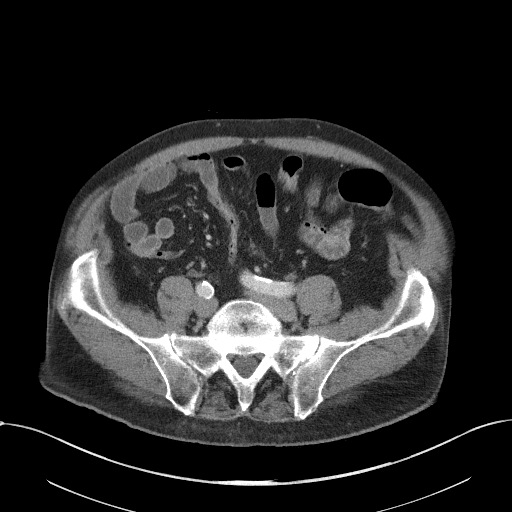
[im 54/95  soft-tissue]
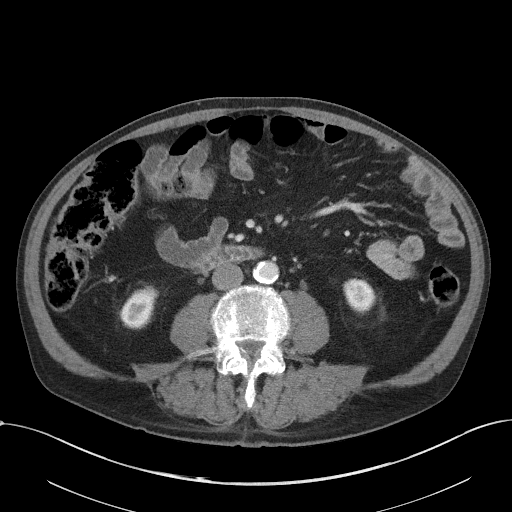
[im 68/95  soft-tissue]
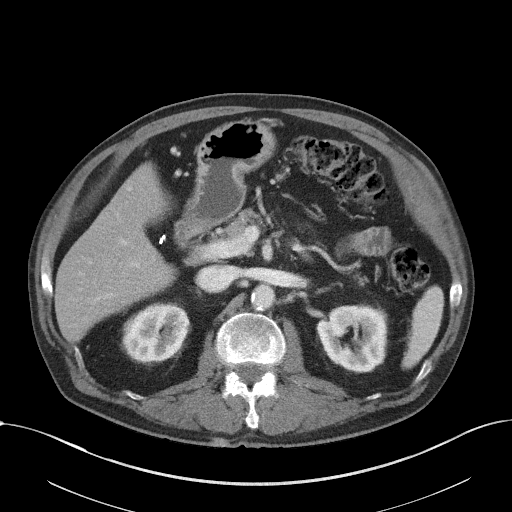
[im 81/95  soft-tissue]
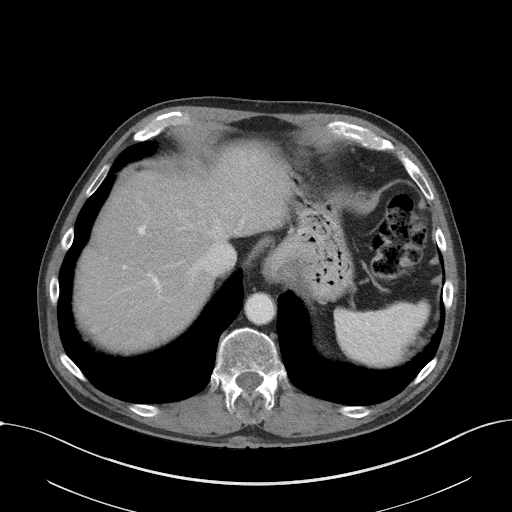

[Series 8: axial delay · axial · delayed · 0.82mm/px · z∈[-1008,-938]mm · 2 of 101 slices shown]
[im 15/101  soft-tissue]
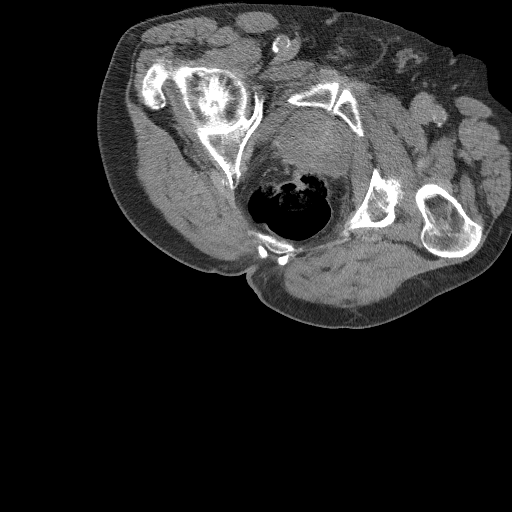
[im 29/101  soft-tissue]
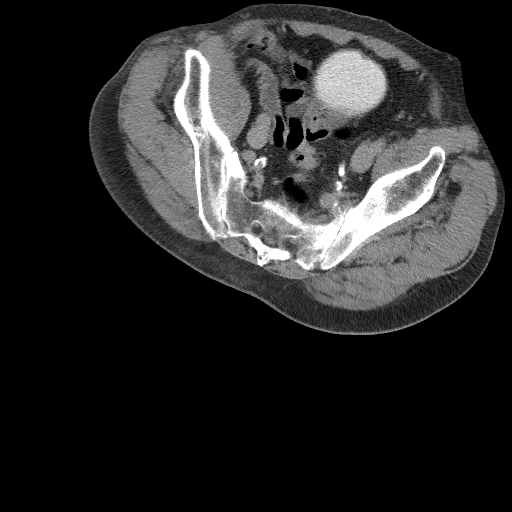

[14 of 32 positions shown; findings below may reference images not displayed]

FINDINGS: Lower chest: No acute abnormality. Three-vessel coronary artery
calcifications. Small hiatal hernia.

Hepatobiliary: No focal liver abnormality is seen. Status post
cholecystectomy. No biliary dilatation.

Pancreas: Unremarkable. No pancreatic ductal dilatation or
surrounding inflammatory changes.

Spleen: Normal in size without significant abnormality.

Adrenals/Urinary Tract: Adrenal glands are unremarkable. Kidneys are
normal, without renal calculi, solid lesion, or hydronephrosis. No
evidence of urinary tract filling defect on delayed phase imaging.
Bladder is unremarkable.

Stomach/Bowel: Stomach is within normal limits. Appendix appears
normal. No evidence of bowel wall thickening, distention, or
inflammatory changes.

Vascular/Lymphatic: Aortic atherosclerosis. No enlarged abdominal or
pelvic lymph nodes.

Reproductive: Prostatomegaly with median lobe hypertrophy.

Other: No abdominal wall hernia or abnormality. No abdominopelvic
ascites.

Musculoskeletal: No acute or significant osseous findings.
IMPRESSION: 1. No CT findings to explain hematuria. No evidence of urinary tract
calculus, mass, or hydronephrosis. No evidence of urinary tract
filling defect on delayed phase imaging.
2. Prostatomegaly with median lobe hypertrophy.
3. Coronary artery disease.

Aortic Atherosclerosis (MA2CR-USA.A).

## 2023-03-23 DIAGNOSIS — Z8673 Personal history of transient ischemic attack (TIA), and cerebral infarction without residual deficits: Secondary | ICD-10-CM | POA: Diagnosis not present

## 2023-03-23 DIAGNOSIS — E7800 Pure hypercholesterolemia, unspecified: Secondary | ICD-10-CM | POA: Diagnosis not present

## 2023-03-23 DIAGNOSIS — K219 Gastro-esophageal reflux disease without esophagitis: Secondary | ICD-10-CM | POA: Diagnosis not present

## 2023-03-23 DIAGNOSIS — I10 Essential (primary) hypertension: Secondary | ICD-10-CM | POA: Diagnosis not present

## 2023-03-23 DIAGNOSIS — R972 Elevated prostate specific antigen [PSA]: Secondary | ICD-10-CM | POA: Diagnosis not present

## 2023-03-23 DIAGNOSIS — R600 Localized edema: Secondary | ICD-10-CM | POA: Diagnosis not present

## 2023-03-23 DIAGNOSIS — H903 Sensorineural hearing loss, bilateral: Secondary | ICD-10-CM | POA: Diagnosis not present

## 2023-03-23 DIAGNOSIS — Z79899 Other long term (current) drug therapy: Secondary | ICD-10-CM | POA: Diagnosis not present

## 2023-03-23 DIAGNOSIS — R7309 Other abnormal glucose: Secondary | ICD-10-CM | POA: Diagnosis not present

## 2023-03-30 DIAGNOSIS — E785 Hyperlipidemia, unspecified: Secondary | ICD-10-CM | POA: Diagnosis not present

## 2023-03-30 DIAGNOSIS — I10 Essential (primary) hypertension: Secondary | ICD-10-CM | POA: Diagnosis not present

## 2023-03-30 DIAGNOSIS — R7309 Other abnormal glucose: Secondary | ICD-10-CM | POA: Diagnosis not present

## 2023-03-30 DIAGNOSIS — Z8673 Personal history of transient ischemic attack (TIA), and cerebral infarction without residual deficits: Secondary | ICD-10-CM | POA: Diagnosis not present

## 2023-03-30 DIAGNOSIS — K219 Gastro-esophageal reflux disease without esophagitis: Secondary | ICD-10-CM | POA: Diagnosis not present

## 2023-03-30 DIAGNOSIS — Z23 Encounter for immunization: Secondary | ICD-10-CM | POA: Diagnosis not present

## 2023-03-30 DIAGNOSIS — D649 Anemia, unspecified: Secondary | ICD-10-CM | POA: Diagnosis not present

## 2023-04-04 DIAGNOSIS — E781 Pure hyperglyceridemia: Secondary | ICD-10-CM | POA: Diagnosis not present

## 2023-04-04 DIAGNOSIS — I10 Essential (primary) hypertension: Secondary | ICD-10-CM | POA: Diagnosis not present

## 2023-04-04 DIAGNOSIS — Z8673 Personal history of transient ischemic attack (TIA), and cerebral infarction without residual deficits: Secondary | ICD-10-CM | POA: Diagnosis not present

## 2023-04-04 DIAGNOSIS — R001 Bradycardia, unspecified: Secondary | ICD-10-CM | POA: Diagnosis not present

## 2023-05-12 DIAGNOSIS — S93492A Sprain of other ligament of left ankle, initial encounter: Secondary | ICD-10-CM | POA: Diagnosis not present

## 2023-06-30 DIAGNOSIS — C44229 Squamous cell carcinoma of skin of left ear and external auricular canal: Secondary | ICD-10-CM | POA: Diagnosis not present

## 2023-08-25 ENCOUNTER — Ambulatory Visit: Payer: Self-pay | Admitting: Urology

## 2023-09-25 DIAGNOSIS — E7800 Pure hypercholesterolemia, unspecified: Secondary | ICD-10-CM | POA: Diagnosis not present

## 2023-09-25 DIAGNOSIS — R7309 Other abnormal glucose: Secondary | ICD-10-CM | POA: Diagnosis not present

## 2023-09-25 DIAGNOSIS — K219 Gastro-esophageal reflux disease without esophagitis: Secondary | ICD-10-CM | POA: Diagnosis not present

## 2023-09-25 DIAGNOSIS — H903 Sensorineural hearing loss, bilateral: Secondary | ICD-10-CM | POA: Diagnosis not present

## 2023-09-25 DIAGNOSIS — I10 Essential (primary) hypertension: Secondary | ICD-10-CM | POA: Diagnosis not present

## 2023-09-25 DIAGNOSIS — I69321 Dysphasia following cerebral infarction: Secondary | ICD-10-CM | POA: Diagnosis not present

## 2023-09-25 DIAGNOSIS — N289 Disorder of kidney and ureter, unspecified: Secondary | ICD-10-CM | POA: Diagnosis not present

## 2023-09-25 DIAGNOSIS — D649 Anemia, unspecified: Secondary | ICD-10-CM | POA: Diagnosis not present

## 2023-09-26 DIAGNOSIS — I10 Essential (primary) hypertension: Secondary | ICD-10-CM | POA: Diagnosis not present

## 2023-09-26 DIAGNOSIS — Z8673 Personal history of transient ischemic attack (TIA), and cerebral infarction without residual deficits: Secondary | ICD-10-CM | POA: Diagnosis not present

## 2023-09-26 DIAGNOSIS — K219 Gastro-esophageal reflux disease without esophagitis: Secondary | ICD-10-CM | POA: Diagnosis not present

## 2023-09-26 DIAGNOSIS — D509 Iron deficiency anemia, unspecified: Secondary | ICD-10-CM | POA: Diagnosis not present

## 2023-09-28 ENCOUNTER — Encounter: Payer: Self-pay | Admitting: Urology

## 2023-09-28 ENCOUNTER — Ambulatory Visit: Payer: Medicare HMO | Admitting: Urology

## 2023-09-28 VITALS — BP 154/70 | HR 68 | Ht 70.0 in | Wt 178.0 lb

## 2023-09-28 DIAGNOSIS — R310 Gross hematuria: Secondary | ICD-10-CM

## 2023-09-28 DIAGNOSIS — N400 Enlarged prostate without lower urinary tract symptoms: Secondary | ICD-10-CM | POA: Diagnosis not present

## 2023-09-28 LAB — URINALYSIS, COMPLETE
Bilirubin, UA: NEGATIVE
Glucose, UA: NEGATIVE
Ketones, UA: NEGATIVE
Leukocytes,UA: NEGATIVE
Nitrite, UA: NEGATIVE
Protein,UA: NEGATIVE
RBC, UA: NEGATIVE
Specific Gravity, UA: 1.02 (ref 1.005–1.030)
Urobilinogen, Ur: 0.2 mg/dL (ref 0.2–1.0)
pH, UA: 5.5 (ref 5.0–7.5)

## 2023-09-28 LAB — MICROSCOPIC EXAMINATION: Bacteria, UA: NONE SEEN

## 2023-09-28 MED ORDER — FINASTERIDE 5 MG PO TABS: 5.0000 mg | ORAL_TABLET | Freq: Every day | ORAL | 3 refills | Status: AC

## 2023-09-28 NOTE — Progress Notes (Signed)
 I, Maysun Anabel Bene, acting as a scribe for Riki Altes, MD., have documented all relevant documentation on the behalf of Riki Altes, MD, as directed by Riki Altes, MD while in the presence of Riki Altes, MD.  09/28/2023 11:02 PM   Manuella Ghazi [birth year] [id number]  Referring provider: Barbette Reichmann, MD [address] [facility] [place],  [place] [address]  Chief Complaint  Patient presents with   Hematuria   Urologic history 1. BPH with LUTS Prior TURP, Dr. Leonette Monarch early 2000. Finasteride 5 mg daily Started finasteride 5 mg daily.  2. Gross hematuria CT urogram 12/2020 with BPH and no upper tract abnormalities Cystoscopy 12/2020 with prominent BPH/hypervascularity. Follow-up 08/02/2022 with recurrent gross hematuria; repeat cystoscopy 08/25/22 showed no bladder mucosal abnormalities.   HPI: Ronald Haney is a 89 y.o. male presents for 1 year follow-up.  Feels his urinary hesitancy has improved on finasteride and denies recurrent gross hematuria.  Denies flank, abdominal, or pelvic pain.   PMH: Past Medical History:  Diagnosis Date   GERD (gastroesophageal reflux disease)    History of hiatal hernia    Hyperlipemia    Hypertension    Skin cancer    face and arms- squam   Stroke (HCC) 2019   vision- has returned to 99%    Surgical History: Past Surgical History:  Procedure Laterality Date   ANKLE SURGERY Right    ccy     CHOLECYSTECTOMY     COLONOSCOPY     LUMBAR LAMINECTOMY/DECOMPRESSION MICRODISCECTOMY Bilateral 10/12/2020   Procedure: Laminectomy and Foraminotomy - Lumbar four-Lumbar five - bilateral;  Surgeon: Barnett Abu, MD;  Location: [facility] OR;  Service: Neurosurgery;  Laterality: Bilateral;   TRANSURETHRAL RESECTION OF PROSTATE      Home Medications:  Allergies as of 09/28/2023   No Known Allergies      Medication List        Accurate as of September 28, 2023 11:02 PM. If you have any questions, ask your nurse or  doctor.          acetaminophen 500 MG tablet Commonly known as: TYLENOL Take 500-1,000 mg by mouth every 6 (six) hours as needed (pain).   amLODipine 5 MG tablet Commonly known as: NORVASC Take 5 mg by mouth in the morning.   atorvastatin 40 MG tablet Commonly known as: Lipitor Take 1 tablet (40 mg total) by mouth daily. What changed: when to take this   clopidogrel 75 MG tablet Commonly known as: PLAVIX Take 1 tablet (75 mg total) by mouth daily. What changed: when to take this   finasteride 5 MG tablet Commonly known as: PROSCAR Take 1 tablet (5 mg total) by mouth daily.   furosemide 20 MG tablet Commonly known as: LASIX Take 20 mg by mouth daily.   gemfibrozil 600 MG tablet Commonly known as: LOPID Take 600 mg by mouth 2 (two) times daily.   lisinopril 40 MG tablet Commonly known as: ZESTRIL Take 40 mg by mouth in the morning.   niacinamide 500 MG tablet Take 500 mg by mouth 2 (two) times daily with a meal.   omeprazole 20 MG capsule Commonly known as: PRILOSEC Take 20 mg by mouth daily before breakfast.   salicyclic acid-sulfur 2-2 % shampoo Commonly known as: SEBULEX SHAMPOO CAPFUL(S) TOPICALLY MONDAY,WEDNESDAY,FRIDAY   Salicylic Acid 40 % Misc SOAK AS DIRECTED THE AFFECTED AREA IN WARM WATER FOR 5 MINUTES, DRY, THEN APPLY PLASTER. REMOVE AFTER 48 HOURS, REPEAT.  USE FOR 14 DAYS ONLY        Allergies: No Known Allergies  Family History: Family History  Problem Relation Age of Onset   CAD Father     Social History:  reports that he has never smoked. He has never used smokeless tobacco. He reports that he does not currently use alcohol. He reports that he does not currently use drugs.   Physical Exam: BP (!) 154/70   Pulse 68   Ht 5\' 10"  (1.778 m)   Wt 178 lb (80.7 kg)   BMI 25.54 kg/m   Constitutional:  Alert and oriented, No acute distress. HEENT: [place] AT, moist mucus membranes.  Trachea midline, no masses. Cardiovascular: No clubbing,  cyanosis, or edema. Respiratory: Normal respiratory effort, no increased work of breathing. GI: Abdomen is soft, nontender, nondistended, no abdominal masses Skin: No rashes, bruises or suspicious lesions. Neurologic: Grossly intact, no focal deficits, moving all 4 extremities. Psychiatric: Normal mood and affect.   Urinalysis Dipstick/microscopy negative.    Assessment & Plan:    1. BPH with LUTS Improved on finasteride, which was refilled.  1 year follow-up  2. History of recurrent gross hematuria No recurrent episodes since starting finasteride UA today clear.  I have reviewed the above documentation for accuracy and completeness, and I agree with the above.   Riki Altes, MD  [facility] [address] [place], [place] [address] [phone]

## 2023-10-03 DIAGNOSIS — E781 Pure hyperglyceridemia: Secondary | ICD-10-CM | POA: Diagnosis not present

## 2023-10-03 DIAGNOSIS — Z8673 Personal history of transient ischemic attack (TIA), and cerebral infarction without residual deficits: Secondary | ICD-10-CM | POA: Diagnosis not present

## 2023-10-03 DIAGNOSIS — R001 Bradycardia, unspecified: Secondary | ICD-10-CM | POA: Diagnosis not present

## 2023-10-03 DIAGNOSIS — I10 Essential (primary) hypertension: Secondary | ICD-10-CM | POA: Diagnosis not present

## 2023-10-03 DIAGNOSIS — R600 Localized edema: Secondary | ICD-10-CM | POA: Diagnosis not present

## 2023-10-04 DIAGNOSIS — D649 Anemia, unspecified: Secondary | ICD-10-CM | POA: Diagnosis not present

## 2023-10-04 DIAGNOSIS — Z0000 Encounter for general adult medical examination without abnormal findings: Secondary | ICD-10-CM | POA: Diagnosis not present

## 2023-10-04 DIAGNOSIS — Z1331 Encounter for screening for depression: Secondary | ICD-10-CM | POA: Diagnosis not present

## 2023-10-04 DIAGNOSIS — E785 Hyperlipidemia, unspecified: Secondary | ICD-10-CM | POA: Diagnosis not present

## 2023-10-04 DIAGNOSIS — R7309 Other abnormal glucose: Secondary | ICD-10-CM | POA: Diagnosis not present

## 2023-10-04 DIAGNOSIS — R790 Abnormal level of blood mineral: Secondary | ICD-10-CM | POA: Diagnosis not present

## 2023-10-04 DIAGNOSIS — I10 Essential (primary) hypertension: Secondary | ICD-10-CM | POA: Diagnosis not present

## 2023-10-04 DIAGNOSIS — H579 Unspecified disorder of eye and adnexa: Secondary | ICD-10-CM | POA: Diagnosis not present

## 2023-12-05 DIAGNOSIS — M542 Cervicalgia: Secondary | ICD-10-CM | POA: Diagnosis not present

## 2024-01-05 DIAGNOSIS — M5412 Radiculopathy, cervical region: Secondary | ICD-10-CM | POA: Diagnosis not present

## 2024-01-05 DIAGNOSIS — J189 Pneumonia, unspecified organism: Secondary | ICD-10-CM | POA: Diagnosis not present

## 2024-01-05 DIAGNOSIS — R109 Unspecified abdominal pain: Secondary | ICD-10-CM | POA: Diagnosis not present

## 2024-01-05 DIAGNOSIS — J90 Pleural effusion, not elsewhere classified: Secondary | ICD-10-CM | POA: Diagnosis not present

## 2024-01-05 DIAGNOSIS — R197 Diarrhea, unspecified: Secondary | ICD-10-CM | POA: Diagnosis not present

## 2024-01-05 DIAGNOSIS — R0789 Other chest pain: Secondary | ICD-10-CM | POA: Diagnosis not present

## 2024-01-05 DIAGNOSIS — M542 Cervicalgia: Secondary | ICD-10-CM | POA: Diagnosis not present

## 2024-01-05 DIAGNOSIS — R1010 Upper abdominal pain, unspecified: Secondary | ICD-10-CM | POA: Diagnosis not present

## 2024-01-07 ENCOUNTER — Inpatient Hospital Stay
Admission: EM | Admit: 2024-01-07 | Discharge: 2024-01-13 | DRG: 871 | Disposition: A | Discharge status: Home / Self Care | Attending: Internal Medicine | Admitting: Internal Medicine

## 2024-01-07 ENCOUNTER — Other Ambulatory Visit: Payer: Self-pay

## 2024-01-07 ENCOUNTER — Emergency Department

## 2024-01-07 ENCOUNTER — Encounter: Payer: Self-pay | Admitting: Emergency Medicine

## 2024-01-07 DIAGNOSIS — I491 Atrial premature depolarization: Secondary | ICD-10-CM | POA: Diagnosis not present

## 2024-01-07 DIAGNOSIS — I130 Hypertensive heart and chronic kidney disease with heart failure and stage 1 through stage 4 chronic kidney disease, or unspecified chronic kidney disease: Secondary | ICD-10-CM | POA: Diagnosis present

## 2024-01-07 DIAGNOSIS — J9621 Acute and chronic respiratory failure with hypoxia: Secondary | ICD-10-CM | POA: Diagnosis not present

## 2024-01-07 DIAGNOSIS — D631 Anemia in chronic kidney disease: Secondary | ICD-10-CM | POA: Diagnosis present

## 2024-01-07 DIAGNOSIS — K429 Umbilical hernia without obstruction or gangrene: Secondary | ICD-10-CM | POA: Diagnosis not present

## 2024-01-07 DIAGNOSIS — I10 Essential (primary) hypertension: Secondary | ICD-10-CM | POA: Diagnosis present

## 2024-01-07 DIAGNOSIS — J9601 Acute respiratory failure with hypoxia: Secondary | ICD-10-CM | POA: Diagnosis not present

## 2024-01-07 DIAGNOSIS — M7989 Other specified soft tissue disorders: Secondary | ICD-10-CM | POA: Diagnosis not present

## 2024-01-07 DIAGNOSIS — Z85828 Personal history of other malignant neoplasm of skin: Secondary | ICD-10-CM

## 2024-01-07 DIAGNOSIS — I700 Atherosclerosis of aorta: Secondary | ICD-10-CM | POA: Diagnosis not present

## 2024-01-07 DIAGNOSIS — R0602 Shortness of breath: Secondary | ICD-10-CM | POA: Diagnosis not present

## 2024-01-07 DIAGNOSIS — N400 Enlarged prostate without lower urinary tract symptoms: Secondary | ICD-10-CM | POA: Diagnosis not present

## 2024-01-07 DIAGNOSIS — Z7902 Long term (current) use of antithrombotics/antiplatelets: Secondary | ICD-10-CM

## 2024-01-07 DIAGNOSIS — I2489 Other forms of acute ischemic heart disease: Secondary | ICD-10-CM | POA: Diagnosis not present

## 2024-01-07 DIAGNOSIS — I2510 Atherosclerotic heart disease of native coronary artery without angina pectoris: Secondary | ICD-10-CM | POA: Diagnosis not present

## 2024-01-07 DIAGNOSIS — M25559 Pain in unspecified hip: Secondary | ICD-10-CM | POA: Diagnosis present

## 2024-01-07 DIAGNOSIS — N179 Acute kidney failure, unspecified: Secondary | ICD-10-CM | POA: Diagnosis present

## 2024-01-07 DIAGNOSIS — R0989 Other specified symptoms and signs involving the circulatory and respiratory systems: Secondary | ICD-10-CM | POA: Diagnosis not present

## 2024-01-07 DIAGNOSIS — J984 Other disorders of lung: Secondary | ICD-10-CM | POA: Diagnosis not present

## 2024-01-07 DIAGNOSIS — Z8249 Family history of ischemic heart disease and other diseases of the circulatory system: Secondary | ICD-10-CM | POA: Diagnosis not present

## 2024-01-07 DIAGNOSIS — E871 Hypo-osmolality and hyponatremia: Secondary | ICD-10-CM | POA: Diagnosis not present

## 2024-01-07 DIAGNOSIS — Z8673 Personal history of transient ischemic attack (TIA), and cerebral infarction without residual deficits: Secondary | ICD-10-CM | POA: Diagnosis not present

## 2024-01-07 DIAGNOSIS — N182 Chronic kidney disease, stage 2 (mild): Secondary | ICD-10-CM | POA: Diagnosis present

## 2024-01-07 DIAGNOSIS — J189 Pneumonia, unspecified organism: Secondary | ICD-10-CM | POA: Diagnosis not present

## 2024-01-07 DIAGNOSIS — E785 Hyperlipidemia, unspecified: Secondary | ICD-10-CM | POA: Diagnosis present

## 2024-01-07 DIAGNOSIS — R0689 Other abnormalities of breathing: Secondary | ICD-10-CM | POA: Diagnosis not present

## 2024-01-07 DIAGNOSIS — J9811 Atelectasis: Secondary | ICD-10-CM | POA: Diagnosis not present

## 2024-01-07 DIAGNOSIS — Z79899 Other long term (current) drug therapy: Secondary | ICD-10-CM

## 2024-01-07 DIAGNOSIS — R188 Other ascites: Secondary | ICD-10-CM | POA: Diagnosis not present

## 2024-01-07 DIAGNOSIS — R918 Other nonspecific abnormal finding of lung field: Secondary | ICD-10-CM | POA: Diagnosis not present

## 2024-01-07 DIAGNOSIS — J90 Pleural effusion, not elsewhere classified: Secondary | ICD-10-CM | POA: Diagnosis not present

## 2024-01-07 DIAGNOSIS — J929 Pleural plaque without asbestos: Secondary | ICD-10-CM | POA: Diagnosis not present

## 2024-01-07 DIAGNOSIS — Z9889 Other specified postprocedural states: Secondary | ICD-10-CM

## 2024-01-07 DIAGNOSIS — J869 Pyothorax without fistula: Secondary | ICD-10-CM | POA: Diagnosis not present

## 2024-01-07 DIAGNOSIS — K449 Diaphragmatic hernia without obstruction or gangrene: Secondary | ICD-10-CM | POA: Diagnosis not present

## 2024-01-07 DIAGNOSIS — R091 Pleurisy: Secondary | ICD-10-CM | POA: Diagnosis not present

## 2024-01-07 DIAGNOSIS — A419 Sepsis, unspecified organism: Secondary | ICD-10-CM | POA: Diagnosis not present

## 2024-01-07 DIAGNOSIS — R000 Tachycardia, unspecified: Secondary | ICD-10-CM | POA: Diagnosis not present

## 2024-01-07 DIAGNOSIS — R609 Edema, unspecified: Secondary | ICD-10-CM | POA: Diagnosis not present

## 2024-01-07 LAB — CBC WITH DIFFERENTIAL/PLATELET
Abs Immature Granulocytes: 0.31 K/uL — ABNORMAL HIGH (ref 0.00–0.07)
Basophils Absolute: 0 K/uL (ref 0.0–0.1)
Basophils Relative: 0 %
Eosinophils Absolute: 1.1 K/uL — ABNORMAL HIGH (ref 0.0–0.5)
Eosinophils Relative: 5 %
HCT: 31.9 % — ABNORMAL LOW (ref 39.0–52.0)
Hemoglobin: 10.7 g/dL — ABNORMAL LOW (ref 13.0–17.0)
Immature Granulocytes: 1 %
Lymphocytes Relative: 9 %
Lymphs Abs: 2.3 K/uL (ref 0.7–4.0)
MCH: 28.8 pg (ref 26.0–34.0)
MCHC: 33.5 g/dL (ref 30.0–36.0)
MCV: 85.8 fL (ref 80.0–100.0)
Monocytes Absolute: 1 K/uL (ref 0.1–1.0)
Monocytes Relative: 4 %
Neutro Abs: 20.3 K/uL — ABNORMAL HIGH (ref 1.7–7.7)
Neutrophils Relative %: 81 %
Platelets: 293 K/uL (ref 150–400)
RBC: 3.72 MIL/uL — ABNORMAL LOW (ref 4.22–5.81)
RDW: 14.9 % (ref 11.5–15.5)
Smear Review: NORMAL
WBC: 25.1 K/uL — ABNORMAL HIGH (ref 4.0–10.5)
nRBC: 0 % (ref 0.0–0.2)

## 2024-01-07 LAB — COMPREHENSIVE METABOLIC PANEL WITH GFR
ALT: 33 U/L (ref 0–44)
AST: 46 U/L — ABNORMAL HIGH (ref 15–41)
Albumin: 2.5 g/dL — ABNORMAL LOW (ref 3.5–5.0)
Alkaline Phosphatase: 71 U/L (ref 38–126)
Anion gap: 16 — ABNORMAL HIGH (ref 5–15)
BUN: 48 mg/dL — ABNORMAL HIGH (ref 8–23)
CO2: 15 mmol/L — ABNORMAL LOW (ref 22–32)
Calcium: 8.4 mg/dL — ABNORMAL LOW (ref 8.9–10.3)
Chloride: 95 mmol/L — ABNORMAL LOW (ref 98–111)
Creatinine, Ser: 1.54 mg/dL — ABNORMAL HIGH (ref 0.61–1.24)
GFR, Estimated: 43 mL/min — ABNORMAL LOW (ref >60)
Glucose, Bld: 136 mg/dL — ABNORMAL HIGH (ref 70–99)
Potassium: 3.9 mmol/L (ref 3.5–5.1)
Sodium: 126 mmol/L — ABNORMAL LOW (ref 135–145)
Total Bilirubin: 0.5 mg/dL (ref 0.0–1.2)
Total Protein: 6.9 g/dL (ref 6.5–8.1)

## 2024-01-07 LAB — OSMOLALITY: Osmolality: 280 mosm/kg (ref 275–295)

## 2024-01-07 LAB — BLOOD GAS, VENOUS
Acid-base deficit: 5.5 mmol/L — ABNORMAL HIGH (ref 0.0–2.0)
Bicarbonate: 18 mmol/L — ABNORMAL LOW (ref 20.0–28.0)
O2 Saturation: 59.3 %
Patient temperature: 37
pCO2, Ven: 29 mmHg — ABNORMAL LOW (ref 44–60)
pH, Ven: 7.4 (ref 7.25–7.43)
pO2, Ven: 38 mmHg (ref 32–45)

## 2024-01-07 LAB — TROPONIN I (HIGH SENSITIVITY)
Troponin I (High Sensitivity): 20 ng/L — ABNORMAL HIGH (ref <18)
Troponin I (High Sensitivity): 90 ng/L — ABNORMAL HIGH (ref <18)
Troponin I (High Sensitivity): 136 ng/L (ref <18)

## 2024-01-07 LAB — OSMOLALITY, URINE: Osmolality, Ur: 393 mosm/kg (ref 300–900)

## 2024-01-07 LAB — TSH: TSH: 0.397 u[IU]/mL (ref 0.350–4.500)

## 2024-01-07 LAB — LACTIC ACID, PLASMA
Lactic Acid, Venous: 1.8 mmol/L (ref 0.5–1.9)
Lactic Acid, Venous: 1.4 mmol/L (ref 0.5–1.9)

## 2024-01-07 LAB — STREP PNEUMONIAE URINARY ANTIGEN: Strep Pneumo Urinary Antigen: NEGATIVE

## 2024-01-07 LAB — BRAIN NATRIURETIC PEPTIDE: B Natriuretic Peptide: 310.2 pg/mL — ABNORMAL HIGH (ref 0.0–100.0)

## 2024-01-07 LAB — MRSA NEXT GEN BY PCR, NASAL: MRSA by PCR Next Gen: NOT DETECTED

## 2024-01-07 LAB — SODIUM, URINE, RANDOM: Sodium, Ur: 28 mmol/L

## 2024-01-07 LAB — PROCALCITONIN: Procalcitonin: 0.35 ng/mL

## 2024-01-07 MED ORDER — ONDANSETRON HCL 4 MG/2ML IJ SOLN: 4.0000 mg | Freq: Four times a day (QID) | INTRAMUSCULAR | Status: DC | PRN

## 2024-01-07 MED ORDER — SODIUM CHLORIDE 0.9 % IV SOLN: INTRAVENOUS | Status: AC

## 2024-01-07 MED ORDER — BENZONATATE 100 MG PO CAPS: 100.0000 mg | ORAL_CAPSULE | Freq: Three times a day (TID) | ORAL | Status: DC | PRN

## 2024-01-07 MED ORDER — IPRATROPIUM-ALBUTEROL 0.5-2.5 (3) MG/3ML IN SOLN
3.0000 mL | Freq: Four times a day (QID) | RESPIRATORY_TRACT | Status: DC
  Administered 2024-01-07: 3 mL via RESPIRATORY_TRACT

## 2024-01-07 MED ORDER — CLOPIDOGREL BISULFATE 75 MG PO TABS
75.0000 mg | ORAL_TABLET | Freq: Every morning | ORAL | Status: DC
  Administered 2024-01-08 – 2024-01-13 (×6): 75 mg via ORAL
  Filled 2024-01-07 (×6): qty 1

## 2024-01-07 MED ORDER — ATORVASTATIN CALCIUM 20 MG PO TABS
40.0000 mg | ORAL_TABLET | Freq: Every morning | ORAL | Status: DC
  Administered 2024-01-08 – 2024-01-13 (×6): 40 mg via ORAL
  Filled 2024-01-07 (×6): qty 2

## 2024-01-07 MED ORDER — ALBUTEROL SULFATE (2.5 MG/3ML) 0.083% IN NEBU: 3.0000 mL | INHALATION_SOLUTION | Freq: Four times a day (QID) | RESPIRATORY_TRACT | Status: DC | PRN

## 2024-01-07 MED ORDER — SODIUM CHLORIDE 0.9 % IV SOLN
2.0000 g | Freq: Once | INTRAVENOUS | Status: AC
  Administered 2024-01-07: 2 g via INTRAVENOUS
  Filled 2024-01-07: qty 12.5

## 2024-01-07 MED ORDER — ONDANSETRON HCL 4 MG PO TABS
4.0000 mg | ORAL_TABLET | Freq: Four times a day (QID) | ORAL | Status: DC | PRN
Start: 2024-01-07 — End: 2024-01-13

## 2024-01-07 MED ORDER — METRONIDAZOLE 500 MG/100ML IV SOLN
500.0000 mg | Freq: Once | INTRAVENOUS | Status: AC
  Administered 2024-01-07: 500 mg via INTRAVENOUS
  Filled 2024-01-07: qty 100

## 2024-01-07 MED ORDER — IPRATROPIUM-ALBUTEROL 0.5-2.5 (3) MG/3ML IN SOLN
3.0000 mL | Freq: Three times a day (TID) | RESPIRATORY_TRACT | Status: DC
  Administered 2024-01-08: 3 mL via RESPIRATORY_TRACT
  Filled 2024-01-07: qty 3

## 2024-01-07 MED ORDER — PANTOPRAZOLE SODIUM 40 MG PO TBEC
40.0000 mg | DELAYED_RELEASE_TABLET | Freq: Every day | ORAL | Status: DC
  Administered 2024-01-07 – 2024-01-12 (×6): 40 mg via ORAL
  Filled 2024-01-07 (×6): qty 1

## 2024-01-07 MED ORDER — VANCOMYCIN HCL IN DEXTROSE 1-5 GM/200ML-% IV SOLN
1000.0000 mg | Freq: Once | INTRAVENOUS | Status: AC
  Administered 2024-01-07: 1000 mg via INTRAVENOUS
  Filled 2024-01-07: qty 200

## 2024-01-07 MED ORDER — SODIUM CHLORIDE 0.9 % IV SOLN
2.0000 g | INTRAVENOUS | Status: AC
  Administered 2024-01-08 – 2024-01-12 (×5): 2 g via INTRAVENOUS
  Filled 2024-01-07 (×5): qty 20

## 2024-01-07 MED ORDER — GUAIFENESIN ER 600 MG PO TB12
1200.0000 mg | ORAL_TABLET | Freq: Two times a day (BID) | ORAL | Status: DC
  Administered 2024-01-07 – 2024-01-13 (×12): 1200 mg via ORAL
  Filled 2024-01-07 (×12): qty 2

## 2024-01-07 MED ORDER — AMLODIPINE BESYLATE 5 MG PO TABS: 5.0000 mg | ORAL_TABLET | Freq: Every morning | ORAL | Status: DC

## 2024-01-07 MED ORDER — FINASTERIDE 5 MG PO TABS
5.0000 mg | ORAL_TABLET | Freq: Every day | ORAL | Status: DC
  Administered 2024-01-08 – 2024-01-13 (×6): 5 mg via ORAL
  Filled 2024-01-07 (×6): qty 1

## 2024-01-07 MED ORDER — TRAZODONE HCL 50 MG PO TABS
25.0000 mg | ORAL_TABLET | Freq: Every evening | ORAL | Status: DC | PRN
  Administered 2024-01-07 – 2024-01-12 (×6): 25 mg via ORAL
  Filled 2024-01-07 (×6): qty 1

## 2024-01-07 MED ORDER — SODIUM CHLORIDE 0.9 % IV SOLN: INTRAVENOUS | Status: DC

## 2024-01-07 MED ORDER — SODIUM CHLORIDE 0.9 % IV SOLN
500.0000 mg | INTRAVENOUS | Status: DC
  Administered 2024-01-07 – 2024-01-08 (×2): 500 mg via INTRAVENOUS
  Filled 2024-01-07 (×3): qty 5

## 2024-01-07 MED ORDER — ENOXAPARIN SODIUM 40 MG/0.4ML IJ SOSY
40.0000 mg | PREFILLED_SYRINGE | INTRAMUSCULAR | Status: DC
  Administered 2024-01-07 – 2024-01-12 (×6): 40 mg via SUBCUTANEOUS
  Filled 2024-01-07 (×6): qty 0.4

## 2024-01-07 MED ORDER — IOHEXOL 350 MG/ML SOLN
60.0000 mL | Freq: Once | INTRAVENOUS | Status: AC | PRN
  Administered 2024-01-07: 60 mL via INTRAVENOUS

## 2024-01-07 MED ORDER — HYDRALAZINE HCL 20 MG/ML IJ SOLN: 5.0000 mg | Freq: Four times a day (QID) | INTRAMUSCULAR | Status: DC | PRN

## 2024-01-07 MED ORDER — LISINOPRIL 10 MG PO TABS: 40.0000 mg | ORAL_TABLET | Freq: Every morning | ORAL | Status: DC

## 2024-01-07 NOTE — Consult Note (Signed)
 CODE SEPSIS - PHARMACY COMMUNICATION  **Broad-spectrum antimicrobials should be administered within one hour of sepsis diagnosis**  Time Code Sepsis call or page was received: 1452  Antibiotics ordered: Cefepime , Vancomycin , Metronidazole   Time of first antibiotic administration: 1503  Additional action taken by pharmacy: N/A  If necessary, name of provider/nurse contacted: N/A    Ronald Haney, PharmD Clinical Pharmacist 01/07/2024 3:08 PM

## 2024-01-07 NOTE — ED Provider Notes (Addendum)
 [facility] Provider Note    Event Date/Time   First MD Initiated Contact with Patient 01/07/24 1335     (approximate)   History   Code Sepsis   HPI  Ronald Haney is a 89 y.o. male who comes in from home due to shortness of breath.  Patient was reportedly diagnosed with pneumonia on Friday and his primary care doctor to prescribe antibiotics.  Patient having right-sided pain patient was 88% on room air went up to 94% on 2 L.   Patient reports some increasing swelling in his legs worse on the left than the right.  He reports been compliant with his medications for CHF.  He denies any falls that he was had, headaches.  He reports pain on his right chest as well as some right and lower abdominal pain.   Physical Exam   Triage Vital Signs: ED Triage Vitals  Encounter Vitals Group     BP 01/07/24 1343 (!) 149/65     Girls Systolic BP Percentile --      Girls Diastolic BP Percentile --      Boys Systolic BP Percentile --      Boys Diastolic BP Percentile --      Pulse Rate 01/07/24 1343 92     Resp 01/07/24 1343 (!) 30     Temp 01/07/24 1343 97.9 F (36.6 C)     Temp Source 01/07/24 1343 Oral     SpO2 01/07/24 1343 94 %     Weight 01/07/24 1342 176 lb 12.9 oz (80.2 kg)     Height 01/07/24 1342 5' 10 (1.778 m)     Head Circumference --      Peak Flow --      Pain Score 01/07/24 1342 0     Pain Loc --      Pain Education --      Exclude from Growth Chart --     Most recent vital signs: Vitals:   01/07/24 1343  BP: (!) 149/65  Pulse: 92  Resp: (!) 30  Temp: 97.9 F (36.6 C)  SpO2: 94%     General: Awake, no distress.  CV:  Good peripheral perfusion.  Resp:  Normal effort.  Right-sided chest wall pain Abd:  No distention.  Soft but some tenderness on the right abdomen. Other:  Edema noted bilaterally with left leg greater than right   ED Results / Procedures / Treatments   Labs (all labs ordered are listed, but only abnormal  results are displayed) Labs Reviewed  CBC WITH DIFFERENTIAL/PLATELET - Abnormal; Notable for the following components:      Result Value   WBC 25.1 (*)    RBC 3.72 (*)    Hemoglobin 10.7 (*)    HCT 31.9 (*)    Neutro Abs 20.3 (*)    Eosinophils Absolute 1.1 (*)    Abs Immature Granulocytes 0.31 (*)    All other components within normal limits  COMPREHENSIVE METABOLIC PANEL WITH GFR - Abnormal; Notable for the following components:   Sodium 126 (*)    Chloride 95 (*)    CO2 15 (*)    Glucose, Bld 136 (*)    BUN 48 (*)    Creatinine, Ser 1.54 (*)    Calcium  8.4 (*)    Albumin  2.5 (*)    AST 46 (*)    GFR, Estimated 43 (*)    Anion gap 16 (*)    All other components within normal limits  BRAIN NATRIURETIC PEPTIDE - Abnormal; Notable for the following components:   B Natriuretic Peptide 310.2 (*)    All other components within normal limits  TROPONIN I (HIGH SENSITIVITY) - Abnormal; Notable for the following components:   Troponin I (High Sensitivity) 20 (*)    All other components within normal limits  CULTURE, BLOOD (ROUTINE X 2)  CULTURE, BLOOD (ROUTINE X 2)  LACTIC ACID, PLASMA  LACTIC ACID, PLASMA     EKG  My interpretation of EKG:  Sinus rhythm 91 without any ST elevation or T wave inversions, normal intervals  RADIOLOGY Increased density demonstrated within the right lung base with a meniscal apex.  Aerated portion of the right lung base demonstrates ill-defined density with linear components.  Linear density left lung base.  Cardiac silhouette is enlarged.  The osseous structures soft tissues unremarkable.     PROCEDURES:  Critical Care performed: Yes, see critical care procedure note(s)  .1-3 Lead EKG Interpretation  Performed by: Ernest Ronal BRAVO, MD Authorized by: Ernest Ronal BRAVO, MD     Interpretation: normal     ECG rate:  90   ECG rate assessment: normal     Rhythm: sinus rhythm     Ectopy: none     Conduction: normal      MEDICATIONS ORDERED  IN ED: Medications - No data to display   IMPRESSION / MDM / ASSESSMENT AND PLAN / ED COURSE  I reviewed the triage vital signs and the nursing notes.   Patient's presentation is most consistent with acute presentation with potential threat to life or bodily function.   Patient comes in with concerns for worsening pain and shortness of breath given his asymmetric leg swelling I am concerned about the possibility of PE.  I will get CT imaging given he reports some pain extending into his abdomen will extend CT imaging over the abdomen.  He denies any falls hitting his head so no indication for CT imaging of his head.  Given the blood work has come back with an elevated white count of 25 blood cultures, lactate were already ordered will start on broad-spectrum antibiotics.  Patient's creatinine is elevated at 1.5 and a sodium of 1.26 troponin slightly elevated BNP slightly elevated.  Given the concern for pleural effusion ongoing to wait on giving fluids until after CT imaging confirms no pulmonary edema given the x-ray read was somewhat nonspecific.  Given the x-ray did not actually show pneumonia I will just cover broadly for now covering abdominal pathology as well.  Right now patient's blood pressures, heart rate are normal and lactate is normal so we can wait on fluid resuscitation to ensure that it would not make things worse if this is some pulmonary edema with his leg swelling.  Patient is currently on oxygen so will require admission to the hospital due to respiratory failure.  Pending CT imaging.    The patient is on the cardiac monitor to evaluate for evidence of arrhythmia and/or significant heart rate changes.      FINAL CLINICAL IMPRESSION(S) / ED DIAGNOSES   Final diagnoses:  Acute respiratory failure with hypoxia (HCC)     Rx / DC Orders   ED Discharge Orders     None        Note:  This document was prepared using Dragon voice recognition software and may include  unintentional dictation errors.   Ernest Ronal BRAVO, MD 01/07/24 1453    Ernest Ronal BRAVO, MD 01/07/24 [phone]

## 2024-01-07 NOTE — ED Notes (Signed)
 Called [facility] to notify them of VBG sent down to lab, [facility] notified.

## 2024-01-07 NOTE — H&P (Addendum)
 History and Physical    Ronald Haney FMW:[id number] DOB: 17-Sep-1930 DOA: 01/07/2024  PCP: Sadie Manna, MD (Confirm with patient/family/NH records and if not entered, this has to be entered at [facility] point of entry) Patient coming from: Home  I have personally briefly reviewed patient's old medical records in [facility]  Chief Complaint: Malaise, shortness of breath  HPI: Ronald Haney is a 89 y.o. male with medical history significant of HTN, HLD, stroke on Plavix , CKD stage II, presented with worsening of malaise shortness of breath.  Patient went on a family trip to beach 2 weeks ago, and spiked low-grade fever of 100.0 for 2 to 3 days, since then patient has been feeling malaise and easily getting tired.  Patient also complained about sharp-like chest pain and right flank pain, worsening with torso movement and deep breath.  He has occasional dry cough.  Went to see PCP on Friday and was started on p.o. antibiotics.  Son visited patient yesterday and found him in respiratory distress.  Overnight patient developed severe respiratory distress and decided to come to ED.  ED Course: Afebrile, no tachycardia tachypneic breathing rate 26/30, O2 saturation 94% on 2 L, blood pressure 140/70.  Chest x-ray showed right-sided pleural effusion and consolidation.  CTA negative for PE but right middle and right lower lobe pneumonia and right-sided moderate pleural effusion loculated.  Blood work showed WBC 25 hemoglobin 10.7 BUN 47 creatinine 1.5 bicarb 15, VBG 7.10/23/36.  Patient was given vancomycin  and cefepime  in the ED.  Review of Systems: As per HPI otherwise 14 point review of systems negative.    Past Medical History:  Diagnosis Date   GERD (gastroesophageal reflux disease)    History of hiatal hernia    Hyperlipemia    Hypertension    Skin cancer    face and arms- squam   Stroke (HCC) 2019   vision- has returned to 99%    Past Surgical History:  Procedure Laterality Date    ANKLE SURGERY Right    ccy     CHOLECYSTECTOMY     COLONOSCOPY     LUMBAR LAMINECTOMY/DECOMPRESSION MICRODISCECTOMY Bilateral 10/12/2020   Procedure: Laminectomy and Foraminotomy - Lumbar four-Lumbar five - bilateral;  Surgeon: Colon Shove, MD;  Location: [facility] OR;  Service: Neurosurgery;  Laterality: Bilateral;   TRANSURETHRAL RESECTION OF PROSTATE       reports that he has never smoked. He has never used smokeless tobacco. He reports that he does not currently use alcohol . He reports that he does not currently use drugs.  No Known Allergies  Family History  Problem Relation Age of Onset   CAD Father      Prior to Admission medications   Medication Sig Start Date End Date Taking? Authorizing Provider  acetaminophen  (TYLENOL ) 500 MG tablet Take 500-1,000 mg by mouth every 6 (six) hours as needed (pain).    [provider]  amLODipine  (NORVASC ) 5 MG tablet Take 5 mg by mouth in the morning.    [provider]  atorvastatin  (LIPITOR) 40 MG tablet Take 1 tablet (40 mg total) by mouth daily. Patient taking differently: Take 40 mg by mouth in the morning. 12/24/17 12/24/18  Tobie Press, MD  clopidogrel  (PLAVIX ) 75 MG tablet Take 1 tablet (75 mg total) by mouth daily. Patient taking differently: Take 75 mg by mouth in the morning. 05/04/18   Sherial Bail, MD  finasteride  (PROSCAR ) 5 MG tablet Take 1 tablet (5 mg total) by mouth daily. 09/28/23  Stoioff, Glendia BROCKS, MD  furosemide  (LASIX ) 20 MG tablet Take 20 mg by mouth daily. 03/22/22   [provider]  gemfibrozil  (LOPID ) 600 MG tablet Take 600 mg by mouth 2 (two) times daily.     [provider]  lisinopril  (ZESTRIL ) 40 MG tablet Take 40 mg by mouth in the morning.    [provider]  niacinamide 500 MG tablet Take 500 mg by mouth 2 (two) times daily with a meal. 07/13/22   [provider]  omeprazole (PRILOSEC) 20 MG capsule Take 20 mg by mouth daily before breakfast. 11/20/17    [provider]  salicyclic acid-sulfur (SEBULEX) 2-2 % shampoo SHAMPOO CAPFUL(S) TOPICALLY MONDAY,WEDNESDAY,FRIDAY 03/02/22   [provider]  Salicylic Acid 40 % MISC SOAK AS DIRECTED THE AFFECTED AREA IN WARM WATER FOR 5 MINUTES, DRY, THEN APPLY PLASTER. REMOVE AFTER 48 HOURS, REPEAT. USE FOR 14 DAYS ONLY 03/02/22   [provider]    Physical Exam: Vitals:   01/07/24 1430 01/07/24 1500 01/07/24 1530 01/07/24 1600  BP: 133/62 134/72 136/84 (!) 147/71  Pulse: 81 86 93 99  Resp: (!) 25 (!) 28 (!) 24 (!) 26  Temp:      TempSrc:      SpO2: 98% 90% 99% 95%  Weight:      Height:        Constitutional: NAD, calm, comfortable Vitals:   01/07/24 1430 01/07/24 1500 01/07/24 1530 01/07/24 1600  BP: 133/62 134/72 136/84 (!) 147/71  Pulse: 81 86 93 99  Resp: (!) 25 (!) 28 (!) 24 (!) 26  Temp:      TempSrc:      SpO2: 98% 90% 99% 95%  Weight:      Height:       Eyes: PERRL, lids and conjunctivae normal ENMT: Mucous membranes are moist. Posterior pharynx clear of any exudate or lesions.Normal dentition.  Neck: normal, supple, no masses, no thyromegaly Respiratory: Diminished breathing sound on right side, no wheezing, coarse crackles on right side, increasing respiratory effort. No accessory muscle use.  Cardiovascular: Regular rate and rhythm, no murmurs / rubs / gallops. 1+ extremity edema. 2+ pedal pulses. No carotid bruits.  Abdomen: no tenderness, no masses palpated. No hepatosplenomegaly. Bowel sounds positive.  Musculoskeletal: no clubbing / cyanosis. No joint deformity upper and lower extremities. Good ROM, no contractures. Normal muscle tone.  Skin: no rashes, lesions, ulcers. No induration Neurologic: CN 2-12 grossly intact. Sensation intact, DTR normal. Strength 5/5 in all 4.  Psychiatric: Normal judgment and insight. Alert and oriented x 3. Normal mood.    Labs on Admission: I have personally reviewed following labs and imaging studies  CBC: Recent  Labs  Lab 01/07/24 1341  WBC 25.1*  NEUTROABS 20.3*  HGB 10.7*  HCT 31.9*  MCV 85.8  PLT 293   Basic Metabolic Panel: Recent Labs  Lab 01/07/24 1341  NA 126*  K 3.9  CL 95*  CO2 15*  GLUCOSE 136*  BUN 48*  CREATININE 1.54*  CALCIUM  8.4*   GFR: Estimated Creatinine Clearance: 32.9 mL/min (A) (by C-G formula based on SCr of 1.54 mg/dL (H)). Liver Function Tests: Recent Labs  Lab 01/07/24 1341  AST 46*  ALT 33  ALKPHOS 71  BILITOT 0.5  PROT 6.9  ALBUMIN  2.5*   No results for input(s): LIPASE, AMYLASE in the last 168 hours. No results for input(s): AMMONIA in the last 168 hours. Coagulation Profile: No results for input(s): INR, PROTIME in the last 168  hours. Cardiac Enzymes: No results for input(s): CKTOTAL, CKMB, CKMBINDEX, TROPONINI in the last 168 hours. BNP (last 3 results) No results for input(s): PROBNP in the last 8760 hours. HbA1C: No results for input(s): HGBA1C in the last 72 hours. CBG: No results for input(s): GLUCAP in the last 168 hours. Lipid Profile: No results for input(s): CHOL, HDL, LDLCALC, TRIG, CHOLHDL, LDLDIRECT in the last 72 hours. Thyroid  Function Tests: No results for input(s): TSH, T4TOTAL, FREET4, T3FREE, THYROIDAB in the last 72 hours. Anemia Panel: No results for input(s): VITAMINB12, FOLATE, FERRITIN, TIBC, IRON, RETICCTPCT in the last 72 hours. Urine analysis:    Component Value Date/Time   COLORURINE YELLOW (A) 04/30/2018 1635   APPEARANCEUR Clear 09/28/2023 1427   LABSPEC 1.020 04/30/2018 1635   PHURINE 5.0 04/30/2018 1635   GLUCOSEU Negative 09/28/2023 1427   HGBUR NEGATIVE 04/30/2018 1635   BILIRUBINUR Negative 09/28/2023 1427   KETONESUR 5 (A) 04/30/2018 1635   PROTEINUR Negative 09/28/2023 1427   PROTEINUR NEGATIVE 04/30/2018 1635   NITRITE Negative 09/28/2023 1427   NITRITE NEGATIVE 04/30/2018 1635   LEUKOCYTESUR Negative 09/28/2023 1427     Radiological Exams on Admission: CT Angio Chest PE W and/or Wo Contrast Result Date: 01/07/2024 CLINICAL DATA:  The EXAM: CT ANGIOGRAPHY CHEST WITH CONTRAST TECHNIQUE: Multidetector CT imaging of the chest was performed using the standard protocol during bolus administration of intravenous contrast. Multiplanar CT image reconstructions and MIPs were obtained to evaluate the vascular anatomy. RADIATION DOSE REDUCTION: This exam was performed according to the departmental dose-optimization program which includes automated exposure control, adjustment of the mA and/or kV according to patient size and/or use of iterative reconstruction technique. CONTRAST:  60mL OMNIPAQUE  IOHEXOL  350 MG/ML SOLN COMPARISON:  Chest CT 03/26/2015. FINDINGS: Cardiovascular: Satisfactory opacification of the pulmonary arteries to the segmental level. No evidence of pulmonary embolism. Normal heart size. No pericardial effusion. There are atherosclerotic calcifications of the aorta and coronary arteries. Mediastinum/Nodes: No enlarged mediastinal, hilar, or axillary lymph nodes. Thyroid  gland is within normal limits. There is mild fluid distention of the esophagus. There is a moderate size hiatal hernia containing the proximal stomach. Lungs/Pleura: The skin there is a stable 3 mm right upper lobe nodule image 5/70. Trachea and central airways are patent. Upper Abdomen: Cholecystectomy clips are present. Musculoskeletal: No chest wall abnormality. No acute or significant osseous findings. Review of the MIP images confirms the above findings. IMPRESSION: 1. No evidence for pulmonary embolism. 2. Moderate-sized right pleural effusion, partially loculated in the upper hemithorax and major fissure. 3. Trace left pleural effusion. 4. Airspace disease and consolidation in the right middle lobe and right lower lobe worrisome for pneumonia. 5. Moderate size hiatal hernia containing the proximal stomach. Mild fluid distention of the esophagus.  6. Aortic atherosclerosis. Aortic Atherosclerosis (ICD10-I70.0).  A Electronically Signed   By: Greig Pique M.D.   On: 01/07/2024 16:57   CT ABDOMEN PELVIS W CONTRAST Result Date: 01/07/2024 CLINICAL DATA:  Acute abdominal pain.  Right-sided pain. EXAM: CT ABDOMEN AND PELVIS WITH CONTRAST TECHNIQUE: Multidetector CT imaging of the abdomen and pelvis was performed using the standard protocol following bolus administration of intravenous contrast. RADIATION DOSE REDUCTION: This exam was performed according to the departmental dose-optimization program which includes automated exposure control, adjustment of the mA and/or kV according to patient size and/or use of iterative reconstruction technique. CONTRAST:  60mL OMNIPAQUE  IOHEXOL  350 MG/ML SOLN COMPARISON:  Concurrent chest CTA, reported separately. CT 01/06/2021 FINDINGS: Lower chest: Right pleural effusion and opacity,  Foley assessed on concurrent chest CT. Hepatobiliary: Punctate calcification in the right hepatic dome. No suspicious liver lesion. Gallbladder physiologically distended, no calcified stone. No biliary dilatation. Pancreas: Fatty atrophy.  No ductal dilatation or inflammation. Spleen: Normal in size without focal abnormality. Adrenals/Urinary Tract: Normal adrenal glands. No hydronephrosis or renal calculi. Symmetric bilateral perinephric edema. Small low-density lesions in both kidneys are too small to characterize, likely small cysts. No further follow-up imaging is recommended. No evidence of suspicious lesion. Symmetric renal excretion on delayed phase imaging. Unremarkable urinary bladder. Stomach/Bowel: Moderate hiatal hernia. Fluid in the distal esophagus. No small bowel obstruction or inflammatory change. Small to moderate volume of formed stool in the colon. The appendix is normal. Vascular/Lymphatic: Aortic and branch atherosclerosis. No aortic aneurysm. The portal vein is patent. No adenopathy. Reproductive: Enlarged prostate spans  5.7 cm. Other: No free air or ascites. Diminutive fat containing umbilical hernia. Small fat containing left inguinal hernia. Mild generalized body wall edema. Tiny sebaceous cyst in the right gluteal soft tissues. Musculoskeletal: There are no acute or suspicious osseous abnormalities. IMPRESSION: 1. Symmetric perinephric fat stranding is nonspecific, typically chronic. Recommend correlation with urinalysis to assess for urinary tract infection 2. Moderate hiatal hernia with fluid in the distal esophagus, can be seen with reflux. 3. Enlarged prostate. Aortic Atherosclerosis (ICD10-I70.0). Electronically Signed   By: Andrea Gasman M.D.   On: 01/07/2024 16:50   US  Venous Img Lower Bilateral Result Date: 01/07/2024 CLINICAL DATA:  leg swelling EXAM: BILATERAL LOWER EXTREMITY VENOUS DOPPLER ULTRASOUND TECHNIQUE: Gray-scale sonography with compression, as well as color and duplex ultrasound, were performed to evaluate the deep venous system(s) from the level of the common femoral vein through the popliteal and proximal calf veins. COMPARISON:  None Available. FINDINGS: VENOUS Normal compressibility of the common femoral, superficial femoral, and popliteal veins, as well as the visualized calf veins. Visualized portions of profunda femoral vein and great saphenous vein unremarkable. No filling defects to suggest DVT on grayscale or color Doppler imaging. Doppler waveforms show normal direction of venous flow, normal respiratory plasticity and response to augmentation. OTHER Superficial soft tissue edema in the LEFT calf subcutaneous tissues. Limitations: none IMPRESSION: Negative. Electronically Signed   By: Corean Salter M.D.   On: 01/07/2024 16:22    EKG: Independently reviewed.  Sinus rhythm, no acute ST changes.  Assessment/Plan Principal Problem:   CAP (community acquired pneumonia) Active Problems:   Empyema (HCC)  (please populate well all problems here in Problem List. (For example, if  patient is on BP meds at home and you resume or decide to hold them, it is a problem that needs to be her. Same for CAD, COPD, HLD and so on)  Sepsis, without acute endorgan damage Right middle lobe and right lower lobe CAP, bacterial Right-sided empyema, rule out - Sepsis as evidenced by leukocytosis, increasing respiratory rate, source infection is right-sided pneumonia and possibly empyema concurrent. - The patient has chronic peripheral edema and clinically appears to be euvolemic, we will hold off IV boluses and maintenance IV fluid. - Continue CAP coverage with ceftriaxone  and azithromycin .  Check MRSA screen, hold off redosing vancomycin  for now. - IR thoracentesis, culture. - Incentive spirometry - DuoNebs -Other Ddx, risk of aspiration is unknown.  Patient admitted as that he has history of hiatal hernia but no recent flareup.  He eats normal texture food and had  occasional cough after eating solid food.  Will consult speech therapist.  Pleural chest pain Elevated troponins - Likely secondary  to pneumonia and sepsis related with demanding ischemia - Other Ddx, troponin elevated pattern rather flat, ACS unlikely.  Check echocardiogram continue Plavix  and statin.  Repeat troponin level.  Hold off systemic anticoagulation  Hyponatremia - Acute, euvolemic to mild fluid overload, clinically suspect SIADH secondary to pneumonia - Check hyponatremia study - Fluid restriction  AKI versus CKD stage II - Euvolemic to mild fluid overload.  Patient received IV contrast today, will give short course of 10-hour IV fluid for kidney protection and hold off Lasix  today.  If kidney function remains stable, plan to resume Lasix  tomorrow.  HTN - Resume amlodipine  and lisinopril  tomorrow if creatinine level and BP stable - As needed hydralazine  for now  DVT prophylaxis: Lovenox  Code Status: Patient desires full code Family Communication: Son at bedside Disposition Plan: Patient is sick with  CAP sepsis, failed outpatient management, requiring IV antibiotics, IR thoracentesis, expect more than 2 midnight hospital stay. Consults called: None Admission status: Telemetry admit   Cort ONEIDA Mana MD [facility] Pager [phone]  01/07/2024, 5:47 PM

## 2024-01-07 NOTE — ED Notes (Signed)
 Patient ambulated to the toilet. Patient did well with ambulation and VS WNL after ambulation.

## 2024-01-07 NOTE — ED Notes (Signed)
 Advised nurse that patient has ready bed

## 2024-01-07 NOTE — ED Triage Notes (Addendum)
 Pt via [facility] from home. Pt c/o SOB, dx with PNA on Friday with his PCP and was prescribed abx. Report lung sounds clear and pt c/o R sided pain. Pt has swelling in his bilateral legs. Reports he does take diuretics and compliant with same. Pt is A&OX4 and NAD.   EMS report: 30 RR, 88% on RA, 94% on 2L [place], 95 HR, 139/64, 142 CBG

## 2024-01-08 ENCOUNTER — Inpatient Hospital Stay
Admit: 2024-01-08 | Discharge: 2024-01-08 | Disposition: A | Discharge status: Home / Self Care | Attending: Internal Medicine

## 2024-01-08 ENCOUNTER — Inpatient Hospital Stay

## 2024-01-08 DIAGNOSIS — J189 Pneumonia, unspecified organism: Secondary | ICD-10-CM | POA: Diagnosis not present

## 2024-01-08 LAB — BODY FLUID CELL COUNT WITH DIFFERENTIAL
Eos, Fluid: 0 %
Lymphs, Fluid: 5 %
Monocyte-Macrophage-Serous Fluid: 22 %
Neutrophil Count, Fluid: 73 %
Total Nucleated Cell Count, Fluid: 3402 uL

## 2024-01-08 LAB — BASIC METABOLIC PANEL WITH GFR
Anion gap: 10 (ref 5–15)
BUN: 38 mg/dL — ABNORMAL HIGH (ref 8–23)
CO2: 19 mmol/L — ABNORMAL LOW (ref 22–32)
Calcium: 8.1 mg/dL — ABNORMAL LOW (ref 8.9–10.3)
Chloride: 104 mmol/L (ref 98–111)
Creatinine, Ser: 1.32 mg/dL — ABNORMAL HIGH (ref 0.61–1.24)
GFR, Estimated: 51 mL/min — ABNORMAL LOW (ref >60)
Glucose, Bld: 99 mg/dL (ref 70–99)
Potassium: 4 mmol/L (ref 3.5–5.1)
Sodium: 130 mmol/L — ABNORMAL LOW (ref 135–145)

## 2024-01-08 LAB — CBC
HCT: 30.7 % — ABNORMAL LOW (ref 39.0–52.0)
Hemoglobin: 10.5 g/dL — ABNORMAL LOW (ref 13.0–17.0)
MCH: 28.5 pg (ref 26.0–34.0)
MCHC: 34.2 g/dL (ref 30.0–36.0)
MCV: 83.4 fL (ref 80.0–100.0)
Platelets: 309 K/uL (ref 150–400)
RBC: 3.68 MIL/uL — ABNORMAL LOW (ref 4.22–5.81)
RDW: 14.8 % (ref 11.5–15.5)
WBC: 16 K/uL — ABNORMAL HIGH (ref 4.0–10.5)
nRBC: 0 % (ref 0.0–0.2)

## 2024-01-08 LAB — HIV ANTIBODY (ROUTINE TESTING W REFLEX): HIV Screen 4th Generation wRfx: NONREACTIVE

## 2024-01-08 LAB — LACTATE DEHYDROGENASE, PLEURAL OR PERITONEAL FLUID: LD, Fluid: 723 U/L — ABNORMAL HIGH (ref 3–23)

## 2024-01-08 LAB — PROTEIN, PLEURAL OR PERITONEAL FLUID: Total protein, fluid: 4.2 g/dL

## 2024-01-08 LAB — GLUCOSE, PLEURAL OR PERITONEAL FLUID: Glucose, Fluid: 81 mg/dL

## 2024-01-08 LAB — TROPONIN I (HIGH SENSITIVITY): Troponin I (High Sensitivity): 147 ng/L (ref <18)

## 2024-01-08 MED ORDER — SODIUM CHLORIDE 0.9 % IV SOLN: INTRAVENOUS | Status: AC

## 2024-01-08 MED ORDER — LIDOCAINE HCL (PF) 1 % IJ SOLN
10.0000 mL | Freq: Once | INTRAMUSCULAR | Status: AC
  Administered 2024-01-08: 10 mL via INTRADERMAL

## 2024-01-08 MED ORDER — IPRATROPIUM-ALBUTEROL 0.5-2.5 (3) MG/3ML IN SOLN
3.0000 mL | Freq: Two times a day (BID) | RESPIRATORY_TRACT | Status: DC
  Administered 2024-01-08 – 2024-01-09 (×2): 3 mL via RESPIRATORY_TRACT
  Filled 2024-01-08 (×2): qty 3

## 2024-01-08 NOTE — Evaluation (Signed)
 Clinical/Bedside Swallow Evaluation Patient Details  Name: Ronald Haney MRN: [id number] Date of Birth: 12-10-1930  Today's Date: 01/08/2024 Time: SLP Start Time (ACUTE ONLY): 1410 SLP Stop Time (ACUTE ONLY): 1455 SLP Time Calculation (min) (ACUTE ONLY): 45 min  Past Medical History:  Past Medical History:  Diagnosis Date   GERD (gastroesophageal reflux disease)    History of hiatal hernia    Hyperlipemia    Hypertension    Skin cancer    face and arms- squam   Stroke (HCC) 2019   vision- has returned to 99%   Past Surgical History:  Past Surgical History:  Procedure Laterality Date   ANKLE SURGERY Right    ccy     CHOLECYSTECTOMY     COLONOSCOPY     LUMBAR LAMINECTOMY/DECOMPRESSION MICRODISCECTOMY Bilateral 10/12/2020   Procedure: Laminectomy and Foraminotomy - Lumbar four-Lumbar five - bilateral;  Surgeon: Colon Shove, MD;  Location: [facility] OR;  Service: Neurosurgery;  Laterality: Bilateral;   TRANSURETHRAL RESECTION OF PROSTATE     HPI:  Pt is a 89 y.o. male with medical history significant of GERD, MOD Hiatal Hernia per Imaging, HTN, HLD, stroke on Plavix , CKD stage II, presented with worsening of malaise shortness of breath.  Patient went on a family trip to beach 2 weeks ago, and spiked low-grade fever of 100.0 for 2 to 3 days, since then patient has been feeling malaise and easily getting tired.  Patient also complained about sharp-like chest pain and right flank pain, worsening with torso movement and deep breath.  He has occasional dry cough.  Went to see PCP on Friday and was started on p.o. antibiotics.  Son visited patient yesterday and found him in respiratory distress.  Overnight patient developed severe respiratory distress and decided to come to ED.  Successful US  guided right thoracentesis performed 01/08/2024, this admit, which yielded 1L of dark amber fluid.      CT Imaging: Moderate-sized right pleural effusion, partially loculated in the  upper hemithorax and major  fissure.  3. Trace left pleural effusion.  4. Airspace disease and consolidation in the right middle lobe and right lower lobe worrisome for pneumonia.  Moderate-size Hiatal Hernia with fluid distention in the distal Esophagus, can be  seen with Reflux.     Assessment / Plan / Recommendation  Clinical Impression    Pt seen for BSE this morning. Pt awake, verbal and engaged easily in conversation w/ this SLP. A/O x4. Pt denied any issues swallowing at his meals; recent Lunch meal.  On [place] O2 support- 2L; afebrile, WBC 16    OF NOTE: Pt endorses s/s of REFLUX at home at times; He is on a PPI. He does not remember a recent REFLUX event but noted he had been at the [facility] w/ Family recently. CT Imaging: Moderate-size Hiatal Hernia with fluid distention in the distal Esophagus, can be  seen with Reflux.   Pt appears to present w/ functional oropharyngeal phase swallowing w/ No overt, oropharyngeal phase dysphagia appreciated during oral intake of trials; No neuromuscular swallowing deficits appreciated. Pt appears at reduced risk for aspiration from an oropharyngeal phase standpoint when following general aspiration precautions. HOWEVER, pt has a baseline presentation of GERD/REFLUX and a MOD-size Hiatal Hernia. ANY Esophageal phase Dysmotility or Regurgitation of REFLUX material can increase risk for aspiration of the REFLUX material during the Retrograde flow, thus impact voicing and Pulmonary status.    Pt sat upright in bed(supported for more upright sitting) and fed self several trials of  thin liquids Via bottle/cup(he does not use straws), purees, and soft solid foods w/ No overt clinical s/s of aspiration noted; clear vocal quality b/t trials, no cough, no decline in pulmonary status, no decline in O2 sats(98%). Oral phase appeared [facility] for bolus management, mastication, and timely A-P transfer/clearing of material. OM exam was [facility] for oral clearing; lingual/labial movements. No unilateral weakness.  Speech clear.    Recommend continue a Regular diet (moistened foods; cut/small pieces) w/ thin liquids- No straws. General aspiration precautions including small bites/sips slowly and Less Talking during meals. GERD/REFLUX precautions w/ Rest Breaks during meals/oral intake to allow for Esophageal clearing. Remain sitting up post meals for ~1 hour. HOB elevated at night when sleeping. Pills 1 at a time w/ water vs in Puree.  Recommend f/u w/ GI for education/management of GERD/Hiatal Hernia and its impact on swallowing. Discussion and handouts given on REFLUX; behaviors to manage REFLUX, and foods.  No further skilled ST services indicated. MD to reconsult ST services if any new needs while admitted. NSG updated. Pt appreciative of Education information. Precautions posted in room, chart.  SLP Visit Diagnosis: Dysphagia, unspecified (R13.10) (MOD size Hiatal Hernia; GERD)    Aspiration Risk   (reduced from an oropharyngeal phase standpoing)    Diet Recommendation   Thin;Age appropriate regular (cut/Small pieces; moistened foods) = a Regular diet (moistened foods; cut/small pieces) w/ thin liquids- No straws. General aspiration precautions including small bites/sips slowly and Less Talking during meals. GERD/REFLUX precautions w/ Rest Breaks during meals/oral intake to allow for Esophageal clearing. Remain sitting up post meals for ~1 hour. HOB elevated at night when sleeping.   Medication Administration: Whole meds with liquid (vs Whole in Puree as needed)    Other  Recommendations Recommended Consults: Consider GI evaluation;Consider esophageal assessment (for Educationa nd Management) Oral Care Recommendations: Oral care BID;Patient independent with oral care     Assistance Recommended at Discharge  PRN  Functional Status Assessment Patient has not had a recent decline in their functional status  Frequency and Duration  (n/a)   (n/a)       Prognosis Prognosis for improved oropharyngeal  function: Good Barriers to Reach Goals: Time post onset;Severity of deficits Barriers/Prognosis Comment: MOD size Hiatal Hernia; GERD; advanced age      Swallow Study   General Date of Onset: 01/07/24 HPI: Pt is a 89 y.o. male with medical history significant of GERD, MOD Hiatal Hernia per Imaging, HTN, HLD, stroke on Plavix , CKD stage II, presented with worsening of malaise shortness of breath.  Patient went on a family trip to beach 2 weeks ago, and spiked low-grade fever of 100.0 for 2 to 3 days, since then patient has been feeling malaise and easily getting tired.  Patient also complained about sharp-like chest pain and right flank pain, worsening with torso movement and deep breath.  He has occasional dry cough.  Went to see PCP on Friday and was started on p.o. antibiotics.  Son visited patient yesterday and found him in respiratory distress.  Overnight patient developed severe respiratory distress and decided to come to ED.  Successful US  guided right thoracentesis performed 01/08/2024, this admit, which yielded 1L of dark amber fluid.    CT Imaging: Moderate-sized right pleural effusion, partially loculated in the  upper hemithorax and major fissure.  3. Trace left pleural effusion.  4. Airspace disease and consolidation in the right middle lobe and  right lower lobe worrisome for pneumonia.  Moderate-size Hiatal Hernia with fluid distention  in the distal Esophagus, can be  seen with Reflux. . Type of Study: Bedside Swallow Evaluation Previous Swallow Assessment: none Diet Prior to this Study: Regular;Thin liquids (Level 0) Temperature Spikes Noted: No (wbc 16.0) Respiratory Status: Nasal cannula (2L) History of Recent Intubation: No Behavior/Cognition: Alert;Cooperative;Pleasant mood Oral Cavity Assessment: Within Functional Limits Oral Care Completed by SLP: Recent completion by staff Oral Cavity - Dentition: Adequate natural dentition Vision: Functional for self-feeding Self-Feeding  Abilities: Able to feed self;Needs set up Patient Positioning: Upright in bed (supported more upright) Baseline Vocal Quality: Normal Volitional Cough: Strong Volitional Swallow: Able to elicit    Oral/Motor/Sensory Function Overall Oral Motor/Sensory Function: Within functional limits   Ice Chips Ice chips: Not tested   Thin Liquid Thin Liquid: Within functional limits Presentation: Cup;Self Fed (~4+ ozs)    Nectar Thick Nectar Thick Liquid: Not tested   Honey Thick Honey Thick Liquid: Not tested   Puree Puree: Within functional limits Presentation: Self Fed;Spoon (6-7 trials)   Solid     Solid: Within functional limits Presentation: Self Fed (5 trials) Other Comments: moistened foods        Comer Portugal, MS, CCC-SLP Speech Language Pathologist Rehab Services; [facility] [phone] (ascom) Alyne Martinson 01/08/2024,2:59 PM

## 2024-01-08 NOTE — Procedures (Signed)
 PROCEDURE SUMMARY:  Successful US  guided right thoracentesis. Yielded 1L of dark amber fluid. Patient tolerated procedure well. No immediate complications. EBL = trace  Specimen sent for labs.  Post procedure chest X-ray reveals no pneumothorax  Jerrilynn Mikowski M Cuauhtemoc Huegel PA-C 01/08/2024 1:15 PM

## 2024-01-08 NOTE — Progress Notes (Signed)
 OT Cancellation Note  Patient Details Name: IDO WOLLMAN MRN: [id number] DOB: May 04, 1931   Cancelled Treatment:    Reason Eval/Treat Not Completed: Other (comment). RN recommending OT evaluation next date. Endorses pt has been up a few times today and has had a long day. Will re-attempt next date.   Erynn Vaca R., MPH, MS, OTR/L ascom [phone] 01/08/24, 3:47 PM

## 2024-01-08 NOTE — Evaluation (Signed)
 Physical Therapy Evaluation Patient Details Name: Ronald Haney MRN: [id number] DOB: Jan 28, 1931 Today's Date: 01/08/2024  History of Present Illness  Ronald Haney is a 89 y.o. male with medical history significant of HTN, HLD, stroke on Plavix , CKD stage II, presented with worsening of malaise shortness of breath.  Clinical Impression  Pt is a pleasant 89 year old male who was admitted for SOB. Pt vitals monitored throughout session, see O2 sats below. Pt performs bed mobility with supervision assist, no concerns with movement. STS transfer done with CGA, no LOB experienced. Ambulation x120' with 1 UE on IV pole, pt demo decreased step length and width, no LOB experienced although requiring oxygen titration change after 40', see below. Pt demonstrates deficits with activity tolerance and balance that are impacting his current QoL. Pt would benefit from skilled PT interventions to address goals. PT to follow acutely as appropriate.    SaO2 on 2L rest = 88% SaO2 on 3L while ambulating = 86% SaO2 on 4 liters of O2 while ambulating = 91% Pt left on 3L at rest=91%           If plan is discharge home, recommend the following: A little help with walking and/or transfers;A little help with bathing/dressing/bathroom;Assist for transportation;Help with stairs or ramp for entrance   Can travel by private vehicle        Equipment Recommendations None recommended by PT  Recommendations for Other Services       Functional Status Assessment Patient has had a recent decline in their functional status and demonstrates the ability to make significant improvements in function in a reasonable and predictable amount of time.     Precautions / Restrictions Precautions Precautions: Fall Recall of Precautions/Restrictions: Intact Restrictions Weight Bearing Restrictions Per Provider Order: No      Mobility  Bed Mobility Overal bed mobility: Needs Assistance Bed Mobility: Supine to Sit      Supine to sit: Supervision     General bed mobility comments: sup assist, pt halfway sitting upon entering room    Transfers Overall transfer level: Needs assistance Equipment used: None Transfers: Sit to/from Stand Sit to Stand: Contact guard assist           General transfer comment: CGA for STS, pt reached for IV pole once standing    Ambulation/Gait Ambulation/Gait assistance: Contact guard assist Gait Distance (Feet): 120 Feet Assistive device: IV Pole Gait Pattern/deviations: Step-through pattern, Decreased step length - right, Decreased step length - left       General Gait Details: use of IV pole this date, O2 monitored, found on 2L but at 88%, bumped to 3L above 90%, once ambulating desat to 86%, bumped to 4L and remained above 91% with ambulation, no LOB but pt stating he felt SOB, educated on diaphragmatic breathing  Stairs            Wheelchair Mobility     Tilt Bed    Modified Rankin (Stroke Patients Only)       Balance Overall balance assessment: Needs assistance Sitting-balance support: Feet supported Sitting balance-Leahy Scale: Good Sitting balance - Comments: sitting EOB   Standing balance support: Single extremity supported Standing balance-Leahy Scale: Good Standing balance comment: standing with use of IV pole for one UE                             Pertinent Vitals/Pain Pain Assessment Pain Assessment: No/denies pain    Home  Living Family/patient expects to be discharged to:: Private residence Living Arrangements: Spouse/significant other Available Help at Discharge: Family;Personal care attendant Type of Home: House Home Access: Stairs to enter Entrance Stairs-Rails: Can reach both Entrance Stairs-Number of Steps: 1   Home Layout: One level Home Equipment: Agricultural consultant (2 wheels);Cane - single point;BSC/3in1 Additional Comments: has been using SPC but has RW available to him    Prior Function Prior Level  of Function : Independent/Modified Independent             Mobility Comments: used a SPC ADLs Comments: ind with ADLs     Extremity/Trunk Assessment   Upper Extremity Assessment Upper Extremity Assessment: Generalized weakness    Lower Extremity Assessment Lower Extremity Assessment: Generalized weakness    Cervical / Trunk Assessment Cervical / Trunk Assessment: Kyphotic  Communication   Communication Communication: No apparent difficulties Factors Affecting Communication: Hearing impaired    Cognition Arousal: Alert Behavior During Therapy: WFL for tasks assessed/performed   PT - Cognitive impairments: No apparent impairments                         Following commands: Intact       Cueing Cueing Techniques: Verbal cues, Tactile cues     General Comments      Exercises     Assessment/Plan    PT Assessment Patient needs continued PT services  PT Problem List Decreased strength;Decreased activity tolerance;Decreased balance;Decreased mobility;Decreased knowledge of use of DME;Decreased safety awareness       PT Treatment Interventions DME instruction;Gait training;Stair training;Functional mobility training;Therapeutic activities;Therapeutic exercise;Balance training;Patient/family education    PT Goals (Current goals can be found in the Care Plan section)  Acute Rehab PT Goals Patient Stated Goal: to breathe better and get stronger PT Goal Formulation: With patient Time For Goal Achievement: 01/22/24 Potential to Achieve Goals: Good    Frequency Min 2X/week     Co-evaluation               AM-PAC PT 6 Clicks Mobility  Outcome Measure Help needed turning from your back to your side while in a flat bed without using bedrails?: None Help needed moving from lying on your back to sitting on the side of a flat bed without using bedrails?: A Little Help needed moving to and from a bed to a chair (including a wheelchair)?: A  Little Help needed standing up from a chair using your arms (e.g., wheelchair or bedside chair)?: A Little Help needed to walk in hospital room?: A Little Help needed climbing 3-5 steps with a railing? : A Lot 6 Click Score: 18    End of Session Equipment Utilized During Treatment: Oxygen Activity Tolerance: Patient tolerated treatment well Patient left: in chair;with call bell/phone within reach;with chair alarm set Nurse Communication: Mobility status;Other (comment) (oxygen need) PT Visit Diagnosis: Unsteadiness on feet (R26.81);Muscle weakness (generalized) (M62.81);Other abnormalities of gait and mobility (R26.89)    Time: 0927-1000 PT Time Calculation (min) (ACUTE ONLY): 33 min   Charges:                   Ceclia Koker Romero-Perozo, SPT  01/08/2024, 12:09 PM

## 2024-01-08 NOTE — Progress Notes (Signed)
 PROGRESS NOTE    Ronald Haney  FMW:[id number] DOB: 12-28-1930 DOA: 01/07/2024 PCP: Sadie Manna, MD    Assessment & Plan:   Principal Problem:   CAP (community acquired pneumonia) Active Problems:   Empyema (HCC)  Assessment and Plan: Sepsis: met criteria w/ leukocytosis, tachypnea, & secondary to right sided pneumonia. Continue on IV rocephin , azithromycin .   Right sided pneumonia: continue on IV rocephin , azithromycin , bronchodilators & encourage incentive spirometry  Right pleural effusion: vs possible right sided empyema. S/p right thoracentesis w/ 1L of dark amber fluid removed, pleural studies are pending on 01/08/24  Elevated troponins: likely secondary to demand ischemia. Echo ordered. Continue on plavix , statin    Hyponatremia: trending up. Continue on IVFs. 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 Continue on IVFs. Avoid nephrotoxic meds   HTN: continue on home dose of amlodipine , lisinopril   ACD: likely secondary to CKD. Will transfuse if Hb < 7.0      DVT prophylaxis: lovenox  Code Status: full Family Communication:  Disposition Plan: likely d/c back home w/ HH   Level of care: Telemetry Medical  Status is: Inpatient Remains inpatient appropriate because: severity of illness     Consultants:    Procedures:   Antimicrobials: azithromycin , rocephin   Subjective: Pt c/o generalized weakness  Objective: Vitals:   01/07/24 2101 01/08/24 0126 01/08/24 0740 01/08/24 0816  BP: 125/63 (!) 154/68  (!) 142/63  Pulse: 82 90  91  Resp: 18 16  19   Temp: 98.2 F (36.8 C) 98 F (36.7 C)  98 F (36.7 C)  TempSrc: Oral Oral    SpO2: 94% 96% 96% 94%  Weight:      Height:        Intake/Output Summary (Last 24 hours) at 01/08/2024 0843 Last data filed at 01/08/2024 0411 Gross per 24 hour  Intake --  Output 600 ml  Net -600 ml   Filed Weights   01/07/24 1342  Weight: 80.2 kg    Examination:  General exam:  Appears calm and comfortable  Respiratory system: decreased breath sounds b/l  Cardiovascular system: S1 & S2+. No  rubs, gallops or clicks.  Gastrointestinal system: Abdomen is nondistended, soft and nontender. Normal bowel sounds heard. Central nervous system: Alert and oriented. Moves all extremities  Psychiatry: Judgement and insight appear normal. Flat mood and affect     Data Reviewed: I have personally reviewed following labs and imaging studies  CBC: Recent Labs  Lab 01/07/24 1341 01/08/24 0558  WBC 25.1* 16.0*  NEUTROABS 20.3*  --   HGB 10.7* 10.5*  HCT 31.9* 30.7*  MCV 85.8 83.4  PLT 293 309   Basic Metabolic Panel: Recent Labs  Lab 01/07/24 1341 01/08/24 0558  NA 126* 130*  K 3.9 4.0  CL 95* 104  CO2 15* 19*  GLUCOSE 136* 99  BUN 48* 38*  CREATININE 1.54* 1.32*  CALCIUM  8.4* 8.1*   GFR: Estimated Creatinine Clearance: 38.4 mL/min (A) (by C-G formula based on SCr of 1.32 mg/dL (H)). Liver Function Tests: Recent Labs  Lab 01/07/24 1341  AST 46*  ALT 33  ALKPHOS 71  BILITOT 0.5  PROT 6.9  ALBUMIN  2.5*   No results for input(s): LIPASE, AMYLASE in the last 168 hours. No results for input(s): AMMONIA in the last 168 hours. Coagulation Profile: No results for input(s): INR, PROTIME in the last 168 hours. Cardiac Enzymes: No results for input(s): CKTOTAL, CKMB, CKMBINDEX, TROPONINI in the last 168 hours.  BNP (last 3 results) No results for input(s): PROBNP in the last 8760 hours. HbA1C: No results for input(s): HGBA1C in the last 72 hours. CBG: No results for input(s): GLUCAP in the last 168 hours. Lipid Profile: No results for input(s): CHOL, HDL, LDLCALC, TRIG, CHOLHDL, LDLDIRECT in the last 72 hours. Thyroid  Function Tests: Recent Labs    01/07/24 1814  TSH 0.397   Anemia Panel: No results for input(s): VITAMINB12, FOLATE, FERRITIN, TIBC, IRON, RETICCTPCT in the last 72 hours. Sepsis  Labs: Recent Labs  Lab 01/07/24 1341 01/07/24 1555  PROCALCITON  --  0.35  LATICACIDVEN 1.8 1.4    Recent Results (from the past 240 hours)  MRSA Next Gen by PCR, Nasal     Status: None   Collection Time: 01/07/24  6:14 PM   Specimen: Nasal Mucosa; Nasal Swab  Result Value Ref Range Status   MRSA by PCR Next Gen NOT DETECTED NOT DETECTED Final    Comment: (NOTE) The GeneXpert MRSA Assay (FDA approved for NASAL specimens only), is one component of a comprehensive MRSA colonization surveillance program. It is not intended to diagnose MRSA infection nor to guide or monitor treatment for MRSA infections. Test performance is not FDA approved in patients less than 20 years old. Performed at [facility], [address] [place], [place] [address]          Radiology Studies: CT Angio Chest PE W and/or Wo Contrast Result Date: 01/07/2024 CLINICAL DATA:  The EXAM: CT ANGIOGRAPHY CHEST WITH CONTRAST TECHNIQUE: Multidetector CT imaging of the chest was performed using the standard protocol during bolus administration of intravenous contrast. Multiplanar CT image reconstructions and MIPs were obtained to evaluate the vascular anatomy. RADIATION DOSE REDUCTION: This exam was performed according to the departmental dose-optimization program which includes automated exposure control, adjustment of the mA and/or kV according to patient size and/or use of iterative reconstruction technique. CONTRAST:  60mL OMNIPAQUE  IOHEXOL  350 MG/ML SOLN COMPARISON:  Chest CT 03/26/2015. FINDINGS: Cardiovascular: Satisfactory opacification of the pulmonary arteries to the segmental level. No evidence of pulmonary embolism. Normal heart size. No pericardial effusion. There are atherosclerotic calcifications of the aorta and coronary arteries. Mediastinum/Nodes: No enlarged mediastinal, hilar, or axillary lymph nodes. Thyroid  gland is within normal limits. There is mild fluid distention of the esophagus.  There is a moderate size hiatal hernia containing the proximal stomach. Lungs/Pleura: The skin there is a stable 3 mm right upper lobe nodule image 5/70. Trachea and central airways are patent. Upper Abdomen: Cholecystectomy clips are present. Musculoskeletal: No chest wall abnormality. No acute or significant osseous findings. Review of the MIP images confirms the above findings. IMPRESSION: 1. No evidence for pulmonary embolism. 2. Moderate-sized right pleural effusion, partially loculated in the upper hemithorax and major fissure. 3. Trace left pleural effusion. 4. Airspace disease and consolidation in the right middle lobe and right lower lobe worrisome for pneumonia. 5. Moderate size hiatal hernia containing the proximal stomach. Mild fluid distention of the esophagus. 6. Aortic atherosclerosis. Aortic Atherosclerosis (ICD10-I70.0).  A Electronically Signed   By: Greig Pique M.D.   On: 01/07/2024 16:57   CT ABDOMEN PELVIS W CONTRAST Result Date: 01/07/2024 CLINICAL DATA:  Acute abdominal pain.  Right-sided pain. EXAM: CT ABDOMEN AND PELVIS WITH CONTRAST TECHNIQUE: Multidetector CT imaging of the abdomen and pelvis was performed using the standard protocol following bolus administration of intravenous contrast. RADIATION DOSE REDUCTION: This exam was performed according to the departmental dose-optimization program which includes automated exposure control,  adjustment of the mA and/or kV according to patient size and/or use of iterative reconstruction technique. CONTRAST:  60mL OMNIPAQUE  IOHEXOL  350 MG/ML SOLN COMPARISON:  Concurrent chest CTA, reported separately. CT 01/06/2021 FINDINGS: Lower chest: Right pleural effusion and opacity, Foley assessed on concurrent chest CT. Hepatobiliary: Punctate calcification in the right hepatic dome. No suspicious liver lesion. Gallbladder physiologically distended, no calcified stone. No biliary dilatation. Pancreas: Fatty atrophy.  No ductal dilatation or  inflammation. Spleen: Normal in size without focal abnormality. Adrenals/Urinary Tract: Normal adrenal glands. No hydronephrosis or renal calculi. Symmetric bilateral perinephric edema. Small low-density lesions in both kidneys are too small to characterize, likely small cysts. No further follow-up imaging is recommended. No evidence of suspicious lesion. Symmetric renal excretion on delayed phase imaging. Unremarkable urinary bladder. Stomach/Bowel: Moderate hiatal hernia. Fluid in the distal esophagus. No small bowel obstruction or inflammatory change. Small to moderate volume of formed stool in the colon. The appendix is normal. Vascular/Lymphatic: Aortic and branch atherosclerosis. No aortic aneurysm. The portal vein is patent. No adenopathy. Reproductive: Enlarged prostate spans 5.7 cm. Other: No free air or ascites. Diminutive fat containing umbilical hernia. Small fat containing left inguinal hernia. Mild generalized body wall edema. Tiny sebaceous cyst in the right gluteal soft tissues. Musculoskeletal: There are no acute or suspicious osseous abnormalities. IMPRESSION: 1. Symmetric perinephric fat stranding is nonspecific, typically chronic. Recommend correlation with urinalysis to assess for urinary tract infection 2. Moderate hiatal hernia with fluid in the distal esophagus, can be seen with reflux. 3. Enlarged prostate. Aortic Atherosclerosis (ICD10-I70.0). Electronically Signed   By: Andrea Gasman M.D.   On: 01/07/2024 16:50   US  Venous Img Lower Bilateral Result Date: 01/07/2024 CLINICAL DATA:  leg swelling EXAM: BILATERAL LOWER EXTREMITY VENOUS DOPPLER ULTRASOUND TECHNIQUE: Gray-scale sonography with compression, as well as color and duplex ultrasound, were performed to evaluate the deep venous system(s) from the level of the common femoral vein through the popliteal and proximal calf veins. COMPARISON:  None Available. FINDINGS: VENOUS Normal compressibility of the common femoral, superficial  femoral, and popliteal veins, as well as the visualized calf veins. Visualized portions of profunda femoral vein and great saphenous vein unremarkable. No filling defects to suggest DVT on grayscale or color Doppler imaging. Doppler waveforms show normal direction of venous flow, normal respiratory plasticity and response to augmentation. OTHER Superficial soft tissue edema in the LEFT calf subcutaneous tissues. Limitations: none IMPRESSION: Negative. Electronically Signed   By: Corean Salter M.D.   On: 01/07/2024 16:22        Scheduled Meds:  atorvastatin   40 mg Oral q AM   clopidogrel   75 mg Oral q AM   enoxaparin  (LOVENOX ) injection  40 mg Subcutaneous Q24H   finasteride   5 mg Oral Daily   guaiFENesin   1,200 mg Oral BID   ipratropium-albuterol   3 mL Nebulization BID   pantoprazole   40 mg Oral Daily   Continuous Infusions:  azithromycin  Stopped (01/07/24 2200)   cefTRIAXone  (ROCEPHIN )  IV 2 g (01/08/24 0411)     LOS: 1 day     Anthony CHRISTELLA Pouch, MD [facility] Pager [phone]  If 7PM-7AM, please contact night-coverage [email or web address] 01/08/2024, 8:43 AM

## 2024-01-08 NOTE — Progress Notes (Signed)
*  PRELIMINARY RESULTS* Echocardiogram 2D Echocardiogram has been performed.  Ronald Haney 01/08/2024, 8:55 AM

## 2024-01-09 DIAGNOSIS — J189 Pneumonia, unspecified organism: Secondary | ICD-10-CM | POA: Diagnosis not present

## 2024-01-09 LAB — COMPREHENSIVE METABOLIC PANEL WITH GFR
ALT: 34 U/L (ref 0–44)
AST: 36 U/L (ref 15–41)
Albumin: 2.2 g/dL — ABNORMAL LOW (ref 3.5–5.0)
Alkaline Phosphatase: 53 U/L (ref 38–126)
Anion gap: 11 (ref 5–15)
BUN: 40 mg/dL — ABNORMAL HIGH (ref 8–23)
CO2: 19 mmol/L — ABNORMAL LOW (ref 22–32)
Calcium: 8.3 mg/dL — ABNORMAL LOW (ref 8.9–10.3)
Chloride: 104 mmol/L (ref 98–111)
Creatinine, Ser: 1.1 mg/dL (ref 0.61–1.24)
GFR, Estimated: >60 mL/min (ref >60)
Glucose, Bld: 183 mg/dL — ABNORMAL HIGH (ref 70–99)
Potassium: 3.8 mmol/L (ref 3.5–5.1)
Sodium: 134 mmol/L — ABNORMAL LOW (ref 135–145)
Total Bilirubin: 0.7 mg/dL (ref 0.0–1.2)
Total Protein: 6.3 g/dL — ABNORMAL LOW (ref 6.5–8.1)

## 2024-01-09 LAB — CBC
HCT: 32 % — ABNORMAL LOW (ref 39.0–52.0)
Hemoglobin: 10.7 g/dL — ABNORMAL LOW (ref 13.0–17.0)
MCH: 28.2 pg (ref 26.0–34.0)
MCHC: 33.4 g/dL (ref 30.0–36.0)
MCV: 84.2 fL (ref 80.0–100.0)
Platelets: 323 K/uL (ref 150–400)
RBC: 3.8 MIL/uL — ABNORMAL LOW (ref 4.22–5.81)
RDW: 15 % (ref 11.5–15.5)
WBC: 19.2 K/uL — ABNORMAL HIGH (ref 4.0–10.5)
nRBC: 0 % (ref 0.0–0.2)

## 2024-01-09 LAB — MAGNESIUM: Magnesium: 1.6 mg/dL — ABNORMAL LOW (ref 1.7–2.4)

## 2024-01-09 LAB — ECHOCARDIOGRAM COMPLETE
AR max vel: 3.93 cm2
AV Area VTI: 4.14 cm2
AV Area mean vel: 3.72 cm2
AV Mean grad: 4 mmHg
AV Peak grad: 6.9 mmHg
Ao pk vel: 1.32 m/s
Area-P 1/2: 4.63 cm2
Height: 70 in
MV VTI: 3.81 cm2
S' Lateral: 2.7 cm
Weight: 2828.94 [oz_av]

## 2024-01-09 MED ORDER — AZITHROMYCIN 250 MG PO TABS
500.0000 mg | ORAL_TABLET | Freq: Every day | ORAL | Status: AC
  Administered 2024-01-09 – 2024-01-11 (×3): 500 mg via ORAL
  Filled 2024-01-09 (×3): qty 2

## 2024-01-09 MED ORDER — MAGNESIUM SULFATE 2 GM/50ML IV SOLN
2.0000 g | Freq: Once | INTRAVENOUS | Status: AC
  Administered 2024-01-09: 2 g via INTRAVENOUS
  Filled 2024-01-09: qty 50

## 2024-01-09 MED ORDER — FUROSEMIDE 20 MG PO TABS
20.0000 mg | ORAL_TABLET | Freq: Every day | ORAL | Status: DC
  Administered 2024-01-09 – 2024-01-13 (×5): 20 mg via ORAL
  Filled 2024-01-09 (×5): qty 1

## 2024-01-09 NOTE — Progress Notes (Signed)
 PROGRESS NOTE   HPI was taken from Dr. Laurita: Ronald Haney Degree is a 89 y.o. male with medical history significant of HTN, HLD, stroke on Plavix , CKD stage II, presented with worsening of malaise shortness of breath.   Patient went on a family trip to beach 2 weeks ago, and spiked low-grade fever of 100.0 for 2 to 3 days, since then patient has been feeling malaise and easily getting tired.  Patient also complained about sharp-like chest pain and right flank pain, worsening with torso movement and deep breath.  He has occasional dry cough.  Went to see PCP on Friday and was started on p.o. antibiotics.  Son visited patient yesterday and found him in respiratory distress.  Overnight patient developed severe respiratory distress and decided to come to ED.   ED Course: Afebrile, no tachycardia tachypneic breathing rate 26/30, O2 saturation 94% on 2 L, blood pressure 140/70.  Chest x-ray showed right-sided pleural effusion and consolidation.  CTA negative for PE but right middle and right lower lobe pneumonia and right-sided moderate pleural effusion loculated.  Blood work showed WBC 25 hemoglobin 10.7 BUN 47 creatinine 1.5 bicarb 15, VBG 7.10/23/36.   Patient was given vancomycin  and cefepime  in the ED    [name]  FMW:[id number] DOB: 08-11-1930 DOA: 01/07/2024 PCP: Sadie Manna, MD    Assessment & Plan:   Principal Problem:   CAP (community acquired pneumonia) Active Problems:   Empyema (HCC)  Assessment and Plan: Sepsis: met criteria w/ leukocytosis, tachypnea, & secondary to right sided pneumonia. Continue on IV rocephin , azithromycin . Blood cxs NGTD  Right sided pneumonia: continue on IV rocephin , azithromycin , bronchodilators & encourage incentive spirometry   Right pleural effusion: vs possible right sided empyema. S/p right thoracentesis w/ 1L of dark amber fluid removed, pleural studies are pending on 01/08/24. Pleural fluid cx NGTD. Cytology of pleural fluid pending   Acute  hypoxic respiratory failure: found to be 85% on 2L [place]. Likely secondary to above. Continue on supplemental oxygen and wean as tolerated.   Elevated troponins: likely secondary to demand ischemia. Echo is pending. Continue on statin, plavix     Hyponatremia: trending up. Possibly secondary SIADH   Hypomagnesemia: mg sulfate given    AKI on CKDII: Cr is trending down today. Avoid nephrotoxic meds   HTN: holding home amlodipine , lisinopril  as BP is WNL   ACD: likely secondary to CKD. H&H stable      DVT prophylaxis: lovenox  Code Status: full Family Communication: discussed pt's care w/ pt's family at bedside and answered their questions  Disposition Plan: likely d/c back home w/ HH   Level of care: Telemetry Medical  Status is: Inpatient Remains inpatient appropriate because: severity of illness, requiring IV abxs & still requiring supplemental oxygen     Consultants:    Procedures:   Antimicrobials: azithromycin , rocephin   Subjective: Pt c/o malaise  Objective: Vitals:   01/08/24 2054 01/09/24 0209 01/09/24 0716 01/09/24 0834  BP: (!) 145/69 (!) 141/73  138/64  Pulse: 87 97  (!) 107  Resp: 16 20  18   Temp: 98.3 F (36.8 C) 98.2 F (36.8 C)  98.7 F (37.1 C)  TempSrc: Oral Oral    SpO2: 93% 90% 93% 94%  Weight:      Height:        Intake/Output Summary (Last 24 hours) at 01/09/2024 0908 Last data filed at 01/09/2024 9386 Gross per 24 hour  Intake 707.25 ml  Output 300 ml  Net 407.25 ml  Filed Weights   01/07/24 1342  Weight: 80.2 kg    Examination:  General exam: appears comfortable  Respiratory system: diminished breath sounds b/l Cardiovascular system: S1/S2+. No rubs or clicks  Gastrointestinal system: abd is soft, NT, ND & normal bowel sounds  Central nervous system: alert & oriented. Moves all extremities  Psychiatry: judgement and insight appears at baseline. Flat mood and affect    Data Reviewed: I have personally reviewed following labs  and imaging studies  CBC: Recent Labs  Lab 01/07/24 1341 01/08/24 0558  WBC 25.1* 16.0*  NEUTROABS 20.3*  --   HGB 10.7* 10.5*  HCT 31.9* 30.7*  MCV 85.8 83.4  PLT 293 309   Basic Metabolic Panel: Recent Labs  Lab 01/07/24 1341 01/08/24 0558 01/09/24 0009  NA 126* 130*  --   K 3.9 4.0  --   CL 95* 104  --   CO2 15* 19*  --   GLUCOSE 136* 99  --   BUN 48* 38*  --   CREATININE 1.54* 1.32*  --   CALCIUM  8.4* 8.1*  --   MG  --   --  1.6*   GFR: Estimated Creatinine Clearance: 38.4 mL/min (A) (by C-G formula based on SCr of 1.32 mg/dL (H)). Liver Function Tests: Recent Labs  Lab 01/07/24 1341  AST 46*  ALT 33  ALKPHOS 71  BILITOT 0.5  PROT 6.9  ALBUMIN  2.5*   No results for input(s): LIPASE, AMYLASE in the last 168 hours. No results for input(s): AMMONIA in the last 168 hours. Coagulation Profile: No results for input(s): INR, PROTIME in the last 168 hours. Cardiac Enzymes: No results for input(s): CKTOTAL, CKMB, CKMBINDEX, TROPONINI in the last 168 hours. BNP (last 3 results) No results for input(s): PROBNP in the last 8760 hours. HbA1C: No results for input(s): HGBA1C in the last 72 hours. CBG: No results for input(s): GLUCAP in the last 168 hours. Lipid Profile: No results for input(s): CHOL, HDL, LDLCALC, TRIG, CHOLHDL, LDLDIRECT in the last 72 hours. Thyroid  Function Tests: Recent Labs    01/07/24 1814  TSH 0.397   Anemia Panel: No results for input(s): VITAMINB12, FOLATE, FERRITIN, TIBC, IRON, RETICCTPCT in the last 72 hours. Sepsis Labs: Recent Labs  Lab 01/07/24 1341 01/07/24 1555  PROCALCITON  --  0.35  LATICACIDVEN 1.8 1.4    Recent Results (from the past 240 hours)  Blood culture (routine x 2)     Status: None (Preliminary result)   Collection Time: 01/07/24  1:41 PM   Specimen: BLOOD  Result Value Ref Range Status   Specimen Description   Final    BLOOD BLOOD RIGHT FOREARM Performed  at [facility], [address], [place], [place] [address]    Special Requests   Final    BOTTLES DRAWN AEROBIC AND ANAEROBIC Blood Culture adequate volume Performed at [facility], [address] [place], [place] [address]    Culture   Final    NO GROWTH < 24 HOURS Performed at [facility], [address], [place], [place] [address]    Report Status PENDING  Incomplete  Blood culture (routine x 2)     Status: None (Preliminary result)   Collection Time: 01/07/24  1:41 PM   Specimen: BLOOD  Result Value Ref Range Status   Specimen Description   Final    BLOOD BLOOD LEFT WRIST Performed at [facility], [address] [place], [place] [address]    Special Requests  Final    BOTTLES DRAWN AEROBIC AND ANAEROBIC Blood Culture adequate volume Performed at [facility], [address] [place], [place] [address]    Culture   Final    NO GROWTH < 24 HOURS Performed at [facility], [address], [place], [place] [address]    Report Status PENDING  Incomplete  MRSA Next Gen by PCR, Nasal     Status: None   Collection Time: 01/07/24  6:14 PM   Specimen: Nasal Mucosa; Nasal Swab  Result Value Ref Range Status   MRSA by PCR Next Gen NOT DETECTED NOT DETECTED Final    Comment: (NOTE) The GeneXpert MRSA Assay (FDA approved for NASAL specimens only), is one component of a comprehensive MRSA colonization surveillance program. It is not intended to diagnose MRSA infection nor to guide or monitor treatment for MRSA infections. Test performance is not FDA approved in patients less than 62 years old. Performed at [facility], [address] [place], [place] [address]   Body fluid culture w Gram Stain     Status: None (Preliminary result)   Collection Time: 01/08/24 12:37 PM   Specimen: PATH Cytology Pleural fluid  Result Value Ref Range Status   Specimen Description   Final    FLUID Performed at [facility], [address] [place], [place] [address]    Special Requests   Final     CYTO PLEU Performed at [facility], [address] [place], [place] [address]    Gram Stain   Final    FEW WBC PRESENT, PREDOMINANTLY PMN NO ORGANISMS SEEN    Culture   Final    NO GROWTH < 12 HOURS Performed at [facility], [address], [place], [place] [address]    Report Status PENDING  Incomplete         Radiology Studies: US  THORACENTESIS ASP PLEURAL SPACE W/IMG GUIDE Result Date: 01/08/2024 INDICATION: [id number] Pleural effusion on right 6183 89 year old male with a history of CHF and a recent diagnosis of pneumonia. He presented to the ED with worsening shortness of breath and right-sided chest and abdominal pain. Found to have a partially loculated right-sided pleural effusion. Request for diagnostic and therapeutic thoracentesis. EXAM: ULTRASOUND GUIDED RIGHT THORACENTESIS MEDICATIONS: 1% lidocaine , 8 mL. COMPLICATIONS: None immediate. PROCEDURE: An ultrasound guided thoracentesis was thoroughly discussed with the patient and questions answered. The benefits, risks, alternatives and complications were also discussed. The patient understands and wishes to proceed with the procedure. Written consent was obtained. Ultrasound was performed to localize and mark an adequate pocket of fluid in the RIGHT chest. The area was then prepped and draped in the normal sterile fashion. 1% Lidocaine  was used for local anesthesia. Under ultrasound guidance a 6 Fr Safe-T-Centesis catheter was introduced. Thoracentesis was performed. The catheter was removed and a dressing applied. FINDINGS: A total of approximately 1 L of dark amber fluid was removed. Samples were sent to the laboratory as requested by the clinical team. IMPRESSION: Successful ultrasound guided diagnostic and therapeutic RIGHT thoracentesis yielding 1 L of pleural fluid. Procedure performed by: Sherrilee Bal, PA-C under the  supervision of Thom Hall, MD Electronically Signed   By: Thom Hall M.D.   On: 01/08/2024 15:32   DG Chest Port 1 View Result Date: 01/08/2024 CLINICAL DATA:  Pleural effusion on right. EXAM: PORTABLE CHEST 1 VIEW COMPARISON:  CT 01/07/2024.  Radiographs 04/30/2018. FINDINGS: 1245 hours. Right pleural effusion has decreased in volume compared  with the recent CT. There is mild extension of fluid into the fissures. Mild atelectasis at both lung bases with interval improved aeration of the right lung base. No evidence of pneumothorax. The heart size and mediastinal contours are stable with mild aortic atherosclerosis. IMPRESSION: Decreased volume of right pleural effusion with improved aeration of the right lung base. No evidence of pneumothorax. Electronically Signed   By: Elsie Perone M.D.   On: 01/08/2024 13:10   CT Angio Chest PE W and/or Wo Contrast Result Date: 01/07/2024 CLINICAL DATA:  The EXAM: CT ANGIOGRAPHY CHEST WITH CONTRAST TECHNIQUE: Multidetector CT imaging of the chest was performed using the standard protocol during bolus administration of intravenous contrast. Multiplanar CT image reconstructions and MIPs were obtained to evaluate the vascular anatomy. RADIATION DOSE REDUCTION: This exam was performed according to the departmental dose-optimization program which includes automated exposure control, adjustment of the mA and/or kV according to patient size and/or use of iterative reconstruction technique. CONTRAST:  60mL OMNIPAQUE  IOHEXOL  350 MG/ML SOLN COMPARISON:  Chest CT 03/26/2015. FINDINGS: Cardiovascular: Satisfactory opacification of the pulmonary arteries to the segmental level. No evidence of pulmonary embolism. Normal heart size. No pericardial effusion. There are atherosclerotic calcifications of the aorta and coronary arteries. Mediastinum/Nodes: No enlarged mediastinal, hilar, or axillary lymph nodes. Thyroid  gland is within normal limits. There is mild fluid distention of  the esophagus. There is a moderate size hiatal hernia containing the proximal stomach. Lungs/Pleura: The skin there is a stable 3 mm right upper lobe nodule image 5/70. Trachea and central airways are patent. Upper Abdomen: Cholecystectomy clips are present. Musculoskeletal: No chest wall abnormality. No acute or significant osseous findings. Review of the MIP images confirms the above findings. IMPRESSION: 1. No evidence for pulmonary embolism. 2. Moderate-sized right pleural effusion, partially loculated in the upper hemithorax and major fissure. 3. Trace left pleural effusion. 4. Airspace disease and consolidation in the right middle lobe and right lower lobe worrisome for pneumonia. 5. Moderate size hiatal hernia containing the proximal stomach. Mild fluid distention of the esophagus. 6. Aortic atherosclerosis. Aortic Atherosclerosis (ICD10-I70.0).  A Electronically Signed   By: Greig Pique M.D.   On: 01/07/2024 16:57   CT ABDOMEN PELVIS W CONTRAST Result Date: 01/07/2024 CLINICAL DATA:  Acute abdominal pain.  Right-sided pain. EXAM: CT ABDOMEN AND PELVIS WITH CONTRAST TECHNIQUE: Multidetector CT imaging of the abdomen and pelvis was performed using the standard protocol following bolus administration of intravenous contrast. RADIATION DOSE REDUCTION: This exam was performed according to the departmental dose-optimization program which includes automated exposure control, adjustment of the mA and/or kV according to patient size and/or use of iterative reconstruction technique. CONTRAST:  60mL OMNIPAQUE  IOHEXOL  350 MG/ML SOLN COMPARISON:  Concurrent chest CTA, reported separately. CT 01/06/2021 FINDINGS: Lower chest: Right pleural effusion and opacity, Foley assessed on concurrent chest CT. Hepatobiliary: Punctate calcification in the right hepatic dome. No suspicious liver lesion. Gallbladder physiologically distended, no calcified stone. No biliary dilatation. Pancreas: Fatty atrophy.  No ductal dilatation  or inflammation. Spleen: Normal in size without focal abnormality. Adrenals/Urinary Tract: Normal adrenal glands. No hydronephrosis or renal calculi. Symmetric bilateral perinephric edema. Small low-density lesions in both kidneys are too small to characterize, likely small cysts. No further follow-up imaging is recommended. No evidence of suspicious lesion. Symmetric renal excretion on delayed phase imaging. Unremarkable urinary bladder. Stomach/Bowel: Moderate hiatal hernia. Fluid in the distal esophagus. No small bowel obstruction or inflammatory change. Small to moderate volume of formed stool in the colon. The  appendix is normal. Vascular/Lymphatic: Aortic and branch atherosclerosis. No aortic aneurysm. The portal vein is patent. No adenopathy. Reproductive: Enlarged prostate spans 5.7 cm. Other: No free air or ascites. Diminutive fat containing umbilical hernia. Small fat containing left inguinal hernia. Mild generalized body wall edema. Tiny sebaceous cyst in the right gluteal soft tissues. Musculoskeletal: There are no acute or suspicious osseous abnormalities. IMPRESSION: 1. Symmetric perinephric fat stranding is nonspecific, typically chronic. Recommend correlation with urinalysis to assess for urinary tract infection 2. Moderate hiatal hernia with fluid in the distal esophagus, can be seen with reflux. 3. Enlarged prostate. Aortic Atherosclerosis (ICD10-I70.0). Electronically Signed   By: Andrea Gasman M.D.   On: 01/07/2024 16:50   US  Venous Img Lower Bilateral Result Date: 01/07/2024 CLINICAL DATA:  leg swelling EXAM: BILATERAL LOWER EXTREMITY VENOUS DOPPLER ULTRASOUND TECHNIQUE: Gray-scale sonography with compression, as well as color and duplex ultrasound, were performed to evaluate the deep venous system(s) from the level of the common femoral vein through the popliteal and proximal calf veins. COMPARISON:  None Available. FINDINGS: VENOUS Normal compressibility of the common femoral,  superficial femoral, and popliteal veins, as well as the visualized calf veins. Visualized portions of profunda femoral vein and great saphenous vein unremarkable. No filling defects to suggest DVT on grayscale or color Doppler imaging. Doppler waveforms show normal direction of venous flow, normal respiratory plasticity and response to augmentation. OTHER Superficial soft tissue edema in the LEFT calf subcutaneous tissues. Limitations: none IMPRESSION: Negative. Electronically Signed   By: Corean Salter M.D.   On: 01/07/2024 16:22        Scheduled Meds:  atorvastatin   40 mg Oral q AM   clopidogrel   75 mg Oral q AM   enoxaparin  (LOVENOX ) injection  40 mg Subcutaneous Q24H   finasteride   5 mg Oral Daily   guaiFENesin   1,200 mg Oral BID   pantoprazole   40 mg Oral Daily   Continuous Infusions:  azithromycin  250 mL/hr at 01/08/24 1800   cefTRIAXone  (ROCEPHIN )  IV 2 g (01/09/24 0306)     LOS: 2 days     Anthony CHRISTELLA Pouch, MD [facility] Pager [phone]  If 7PM-7AM, please contact night-coverage [email or web address] 01/09/2024, 9:08 AM

## 2024-01-09 NOTE — Progress Notes (Signed)
 PHARMACIST - PHYSICIAN COMMUNICATION DR:   Trudy CONCERNING: Antibiotic IV to Oral Route Change Policy  RECOMMENDATION: This patient is receiving azithromycin  by the intravenous route.  Based on criteria approved by the Pharmacy and Therapeutics Committee, the antibiotic(s) is/are being converted to the equivalent oral dose form(s).   DESCRIPTION: These criteria include: Patient being treated for a respiratory tract infection, urinary tract infection, cellulitis or clostridium difficile associated diarrhea if on metronidazole  The patient is not neutropenic and does not exhibit a GI malabsorption state The patient is eating (either orally or via tube) and/or has been taking other orally administered medications for a least 24 hours The patient is improving clinically and has a Tmax < 100.5   Kirti Carl Rodriguez-Guzman PharmD, BCPS 01/09/2024 1:41 PM

## 2024-01-09 NOTE — Care Management Important Message (Signed)
 Important Message  Patient Details  Name: Ronald Haney MRN: [id number] Date of Birth: 05/18/1931   Important Message Given:  Yes - Medicare IM     [name] 01/09/2024, 2:17 PM

## 2024-01-09 NOTE — Evaluation (Signed)
 Occupational Therapy Evaluation Patient Details Name: Ronald Haney MRN: [id number] DOB: [birth year] Today's Date: 01/09/2024   History of Present Illness   Ronald Haney is a 89 y.o. male with medical history significant of HTN, HLD, stroke on Plavix , CKD stage II, presented with worsening of malaise shortness of breath.    Clinical Impressions Pt was seen for OT evaluation this date. Prior to hospital admission, pt was active and independent, using a SPC when taking daily walks and not using AD when tending to his vegetable garden. Pt lives with his spouse. Pt presents to acute OT demonstrating impaired ADL performance and functional mobility 2/2 decreased strength, activity tolerance, balance, and acute need for O2 (See OT problem list for additional functional deficits). Pt currently requires CGA for ADL transfers and supv-CGA for ADL mobility (pt noting feeling better using RW versus SPC), PRN MIN A for LB ADL. Pt educated in ADL transfers, activity pacing, work simplification, and AE/DME to maximize safety/indep with ADL and IADL. Pt verbalized understanding. Pt would benefit from skilled OT services to address noted impairments and functional limitations (see below for any additional details) in order to maximize safety and independence while minimizing falls risk and caregiver burden. Anticipate the need for follow up OT services upon acute hospital DC.    If plan is discharge home, recommend the following  A little help with bathing/dressing/bathroom;Assistance with cooking/housework;Assist for transportation;Help with stairs or ramp for entrance    Functional Status Assessment   Patient has had a recent decline in their functional status and demonstrates the ability to make significant improvements in function in a reasonable and predictable amount of time.    Equipment Recommendations  None recommended by OT     Precautions/Restrictions   Precautions Precautions: Fall Recall  of Precautions/Restrictions: Intact Restrictions Weight Bearing Restrictions Per Provider Order: No     Mobility Bed Mobility Overal bed mobility: Needs Assistance Bed Mobility: Sit to Supine       Sit to supine: Supervision        Transfers Overall transfer level: Needs assistance Equipment used: Straight cane, Rolling walker (2 wheels) Transfers: Sit to/from Stand Sit to Stand: Contact guard assist           General transfer comment: pt reports feeling better using RW versus SPC for transfers and mobility      Balance Overall balance assessment: Needs assistance Sitting-balance support: Feet supported Sitting balance-Leahy Scale: Good     Standing balance support: Single extremity supported, Bilateral upper extremity supported Standing balance-Leahy Scale: Good        ADL either performed or assessed with clinical judgement   ADL Overall ADL's : Needs assistance/impaired      Functional mobility during ADLs: Supervision/safety;Contact guard assist;Cueing for sequencing;Rolling walker (2 wheels) General ADL Comments: PRN MIN A For LB ADL tasks      Pertinent Vitals/Pain Pain Assessment Pain Assessment: No/denies pain     Extremity/Trunk Assessment Upper Extremity Assessment Upper Extremity Assessment: Generalized weakness   Lower Extremity Assessment Lower Extremity Assessment: Generalized weakness   Cervical / Trunk Assessment Cervical / Trunk Assessment: Kyphotic   Communication Communication Communication: Impaired Factors Affecting Communication: Hearing impaired   Cognition Arousal: Alert Behavior During Therapy: WFL for tasks assessed/performed Cognition: No apparent impairments    Following commands: Intact       Cueing  General Comments   Cueing Techniques: Verbal cues;Tactile cues  SpO2 95-96% on 2L, removed and SpO2 91-93% at rest, decreasing to 90-91% on  room air with mobility in room (~40')   Exercises Other  Exercises Other Exercises: Pt educated in ADL transfers, activity pacing, work simplification, and AE/DME to maximize safety/indep with ADL and IADL        Home Living Family/patient expects to be discharged to:: Private residence Living Arrangements: Spouse/significant other Available Help at Discharge: Family;Personal care attendant Type of Home: House Home Access: Stairs to enter Secretary/administrator of Steps: 1 Entrance Stairs-Rails: Can reach both Home Layout: One level     Bathroom Shower/Tub: Producer, television/film/video: Standard Bathroom Accessibility: No   Home Equipment: Agricultural consultant (2 wheels);Cane - single point;BSC/3in1;Shower seat;Hand held shower head   Additional Comments: has been using SPC but has RW available to him      Prior Functioning/Environment Prior Level of Function : Independent/Modified Independent;Driving             Mobility Comments: used a SPC ADLs Comments: ind with ADLs, IADL, enjoys tending to his vegetable garden    OT Problem List: Decreased strength;Cardiopulmonary status limiting activity;Decreased activity tolerance;Impaired balance (sitting and/or standing);Decreased knowledge of use of DME or AE   OT Treatment/Interventions: Self-care/ADL training;Therapeutic exercise;Therapeutic activities;Energy conservation;DME and/or AE instruction;Patient/family education;Balance training      OT Goals(Current goals can be found in the care plan section)   Acute Rehab OT Goals Patient Stated Goal: go home and get back to gardening OT Goal Formulation: With patient Time For Goal Achievement: 01/23/24 Potential to Achieve Goals: Good   OT Frequency:  Min 2X/week       AM-PAC OT 6 Clicks Daily Activity     Outcome Measure Help from another person eating meals?: None Help from another person taking care of personal grooming?: None Help from another person toileting, which includes using toliet, bedpan, or urinal?:  None Help from another person bathing (including washing, rinsing, drying)?: A Little Help from another person to put on and taking off regular upper body clothing?: None Help from another person to put on and taking off regular lower body clothing?: A Little 6 Click Score: 22   End of Session Equipment Utilized During Treatment: Rolling walker (2 wheels) Nurse Communication: Mobility status;Other (comment) (O2)  Activity Tolerance: Patient tolerated treatment well Patient left: in bed;with call bell/phone within reach;with bed alarm set;Other (comment) (O2 put back on)  OT Visit Diagnosis: Other abnormalities of gait and mobility (R26.89);Muscle weakness (generalized) (M62.81)                Time: 9044-8984 OT Time Calculation (min): 20 min Charges:  OT General Charges $OT Visit: 1 Visit OT Evaluation $OT Eval Low Complexity: 1 Low OT Treatments $Therapeutic Activity: 8-22 mins  Warren SAUNDERS., MPH, MS, OTR/L ascom [phone] 01/09/24, 1:28 PM

## 2024-01-10 DIAGNOSIS — J189 Pneumonia, unspecified organism: Secondary | ICD-10-CM | POA: Diagnosis not present

## 2024-01-10 DIAGNOSIS — A419 Sepsis, unspecified organism: Secondary | ICD-10-CM | POA: Diagnosis not present

## 2024-01-10 LAB — CBC
HCT: 29.3 % — ABNORMAL LOW (ref 39.0–52.0)
Hemoglobin: 10.1 g/dL — ABNORMAL LOW (ref 13.0–17.0)
MCH: 29 pg (ref 26.0–34.0)
MCHC: 34.5 g/dL (ref 30.0–36.0)
MCV: 84.2 fL (ref 80.0–100.0)
Platelets: 298 K/uL (ref 150–400)
RBC: 3.48 MIL/uL — ABNORMAL LOW (ref 4.22–5.81)
RDW: 15.2 % (ref 11.5–15.5)
WBC: 22.9 K/uL — ABNORMAL HIGH (ref 4.0–10.5)
nRBC: 0 % (ref 0.0–0.2)

## 2024-01-10 LAB — BASIC METABOLIC PANEL WITH GFR
Anion gap: 12 (ref 5–15)
BUN: 40 mg/dL — ABNORMAL HIGH (ref 8–23)
CO2: 17 mmol/L — ABNORMAL LOW (ref 22–32)
Calcium: 8.2 mg/dL — ABNORMAL LOW (ref 8.9–10.3)
Chloride: 105 mmol/L (ref 98–111)
Creatinine, Ser: 1.09 mg/dL (ref 0.61–1.24)
GFR, Estimated: >60 mL/min (ref >60)
Glucose, Bld: 128 mg/dL — ABNORMAL HIGH (ref 70–99)
Potassium: 3.5 mmol/L (ref 3.5–5.1)
Sodium: 134 mmol/L — ABNORMAL LOW (ref 135–145)

## 2024-01-10 LAB — CYTOLOGY - NON PAP

## 2024-01-10 LAB — MAGNESIUM: Magnesium: 2.2 mg/dL (ref 1.7–2.4)

## 2024-01-10 MED ORDER — OLOPATADINE HCL 0.1 % OP SOLN
1.0000 [drp] | Freq: Two times a day (BID) | OPHTHALMIC | Status: DC
  Administered 2024-01-11 – 2024-01-13 (×5): 1 [drp] via OPHTHALMIC
  Filled 2024-01-10: qty 5

## 2024-01-10 MED ORDER — AMLODIPINE BESYLATE 10 MG PO TABS
10.0000 mg | ORAL_TABLET | Freq: Every day | ORAL | Status: DC
  Administered 2024-01-10 – 2024-01-13 (×4): 10 mg via ORAL
  Filled 2024-01-10 (×4): qty 1

## 2024-01-10 NOTE — Progress Notes (Signed)
 Physical Therapy Treatment Patient Details Name: Ronald Haney MRN: [id number] DOB: [birth year] Today's Date: 01/10/2024   History of Present Illness Ronald Haney is a 89 y.o. male with medical history significant of HTN, HLD, stroke on Plavix , CKD stage II, presented with worsening of malaise shortness of breath.    PT Comments  Today's treatment involved ambulation and testing oxygen needs. Pt able to perform bed mobility with mod I, no concerns with pain or SOB. STS transition successful with mod I, pt this date using rollator, no LOB experienced. Ambulation 2x100' successful with rollator, no rest break needed to complete safely. Oxygen walk test performed, see below for values. Pt overall progressing towards goals and improving current physical status. PT to follow acutely as appropriate.    SaO2 on room air at rest = 92% SaO2 on room air while ambulating = 88% SaO2 on 2 liters of O2 while ambulating = 91%     If plan is discharge home, recommend the following: A little help with walking and/or transfers;A little help with bathing/dressing/bathroom;Assist for transportation;Help with stairs or ramp for entrance   Can travel by private vehicle        Equipment Recommendations  None recommended by PT    Recommendations for Other Services       Precautions / Restrictions Precautions Precautions: Fall Recall of Precautions/Restrictions: Intact Restrictions Weight Bearing Restrictions Per Provider Order: No     Mobility  Bed Mobility Overal bed mobility: Needs Assistance Bed Mobility: Supine to Sit, Sit to Supine     Supine to sit: Modified independent (Device/Increase time) Sit to supine: Modified independent (Device/Increase time)   General bed mobility comments: mod i for increased time to initiate movement    Transfers Overall transfer level: Needs assistance Equipment used: Rollator (4 wheels) Transfers: Sit to/from Stand Sit to Stand: Modified independent  (Device/Increase time)           General transfer comment: STS with mod i, no LOB experienced    Ambulation/Gait Ambulation/Gait assistance: Contact guard assist Gait Distance (Feet): 200 Feet Assistive device: Rollator (4 wheels) Gait Pattern/deviations: Step-through pattern, Decreased step length - right, Decreased step length - left       General Gait Details: 1x100' with 2L, 1x100' with no O2, range of 90-92% with 2L, range of 88-91% with no O2, step through pattern, no rest breaks required this date   Stairs             Wheelchair Mobility     Tilt Bed    Modified Rankin (Stroke Patients Only)       Balance Overall balance assessment: Needs assistance Sitting-balance support: Feet supported Sitting balance-Leahy Scale: Good Sitting balance - Comments: sitting EOB   Standing balance support: Bilateral upper extremity supported Standing balance-Leahy Scale: Good Standing balance comment: use of rollator                            Communication Communication Communication: Impaired Factors Affecting Communication: Hearing impaired  Cognition Arousal: Alert Behavior During Therapy: WFL for tasks assessed/performed   PT - Cognitive impairments: No apparent impairments                         Following commands: Intact      Cueing Cueing Techniques: Verbal cues, Tactile cues  Exercises      General Comments        Pertinent Vitals/Pain Pain  Assessment Pain Assessment: No/denies pain    Home Living                          Prior Function            PT Goals (current goals can now be found in the care plan section) Acute Rehab PT Goals Patient Stated Goal: to breathe better and get stronger PT Goal Formulation: With patient Time For Goal Achievement: 01/22/24 Potential to Achieve Goals: Good Progress towards PT goals: Progressing toward goals    Frequency    Min 2X/week      PT Plan       Co-evaluation              AM-PAC PT 6 Clicks Mobility   Outcome Measure  Help needed turning from your back to your side while in a flat bed without using bedrails?: None Help needed moving from lying on your back to sitting on the side of a flat bed without using bedrails?: None   Help needed standing up from a chair using your arms (e.g., wheelchair or bedside chair)?: A Little Help needed to walk in hospital room?: A Little Help needed climbing 3-5 steps with a railing? : A Little 6 Click Score: 17    End of Session Equipment Utilized During Treatment: Oxygen Activity Tolerance: Patient tolerated treatment well Patient left: in bed;with call bell/phone within reach;with family/visitor present Nurse Communication: Mobility status PT Visit Diagnosis: Unsteadiness on feet (R26.81);Muscle weakness (generalized) (M62.81);Other abnormalities of gait and mobility (R26.89)     Time: 8641-8588 PT Time Calculation (min) (ACUTE ONLY): 13 min  Charges:                              Tylisha Danis Romero-Perozo, SPT  01/10/2024, 2:22 PM

## 2024-01-10 NOTE — Hospital Course (Addendum)
 89yo with h/o HTN, HLD, CVA on Plavix , and stage 2 CKD who presented on 7/13 with SOB and fever.  He was found to have RLL PNA and moderate R-sided pleural effusion now s/p thoracentesis.  He was eventually able to wean off O2.  Repeat CXR with recurrent effusion.  Attempted thoracentesis was unsuccessful.  Pulmonology was consulted.  Repeat CT with concern for mass, but this could still be infectious in nature. Dr. Parris recommends 2 weeks of Augmentin  and will follow up in clinic.

## 2024-01-10 NOTE — Progress Notes (Signed)
 Progress Note   Patient: Ronald Haney FMW:[id number] DOB: 12-Aug-1930 DOA: 01/07/2024     3 DOS: the patient was seen and examined on 01/10/2024   Brief hospital course: 89yo with h/o HTN, HLD, CVA on Plavix , and stage 2 CKD who presented on 7/13 with SOB and fever.  He was found to have RLL PNA and moderate R-sided pleural effusion now s/p thoracentesis.  He remains on 1L [place] O2; 86% on RA, 94% on 2L [place] O2 with ambulation.  Assessment and Plan:  Sepsis due to RLL PNA with acute hypoxic respiratory failure Met criteria w/ leukocytosis, tachypnea, & secondary to right sided pneumonia O2 sats as low as 86%, improved with [place] O2; will wean as tolerated Continue on IV rocephin , azithromycin  Blood cultures NTD Bronchodilators & encourage incentive spirometry    Right pleural effusion vs. 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 Hypovolemic in the setting of acute infection Improving   AKI on CKD II Elevated creatinine on presentation Appears to be back to baseline at this time Attempt to avoid nephrotoxic medications Recheck BMP in AM    HTN Holding home amlodipine , lisinopril  as BP has been WNL  Will resume amlodipine  on 7/17, as BP is uptrending   Anemia of chronic disease Appears to be at/near baseline      Consultants: IR SLP PT OT TOC team  Procedures: Thoracentesis 7/14 Echocardiogram 7/14  Antibiotics: Azithromycin  7/13-18 Cefepime  x 1 Ceftriaxone  7/14-19 Vancomycin  x 1  30 Day Unplanned Readmission Risk Score    Flowsheet Row ED to Hosp-Admission (Current) from 01/07/2024 in [facility]  30 Day Unplanned Readmission Risk Score  (%) 15.58 Filed at 01/10/2024 0801    This score is the patient's risk of an unplanned readmission within 30 days of being discharged (0 -100%). The score is based on dignosis, age, lab data, medications, orders, and past utilization.   Low:  0-14.9   Medium: 15-21.9   High: 22-29.9   Extreme: 30 and above           Subjective: Feeling some better.  Still SOB with getting pu and moving around.     Objective: Vitals:   01/10/24 1045 01/10/24 1048  BP:    Pulse:    Resp:    Temp:    SpO2: (!) 86% 96%    Intake/Output Summary (Last 24 hours) at 01/10/2024 1835 Last data filed at 01/10/2024 1529 Gross per 24 hour  Intake 180 ml  Output --  Net 180 ml   Filed Weights   01/07/24 1342  Weight: 80.2 kg    Exam:  General:  Appears calm and comfortable and is in NAD, on RA when I saw him (for a short period) Eyes:   normal lids, iris ENT:  grossly normal hearing, lips & tongue, mmm Cardiovascular:  RRR. No LE edema.  Respiratory:   CTA bilaterally with no wheezes/rales/rhonchi.  Mildly increased respiratory effort. Abdomen:  soft, NT, ND Skin:  no rash or induration seen on limited exam Musculoskeletal:  grossly normal tone BUE/BLE, good ROM, no bony abnormality Psychiatric:  grossly normal mood and affect, speech fluent and appropriate, AOx3 Neurologic:  CN 2-12 grossly intact, moves  all extremities in coordinated fashion  Data Reviewed: I have reviewed the patient's lab results since admission.  Pertinent labs for today include:   Na++ 134, improved and not clinically significant CO2 17 Glucose 128 BUN 40/Creatinine 1.09/GFR >60 WBC 22.9 Hgb 10.1 Pleural fluid with no growth x 2 days Fungal stain on pleural fluid is pending  Blood cultures NTD x 2 days   Family Communication: None present; I spoke with his son by telephone  Disposition: Status is: Inpatient Remains inpatient appropriate because: ongoing management     Time spent: 50 minutes  Unresulted  Labs (From admission, onward)     Start     Ordered   01/11/24 0500  CBC with Differential/Platelet  Tomorrow morning,   R       Question:  Specimen collection method  Answer:  Lab=Lab collect   01/10/24 1833   01/11/24 0500  Basic metabolic panel with GFR  Tomorrow morning,   R       Question:  Specimen collection method  Answer:  Lab=Lab collect   01/10/24 1833   01/08/24 1254  Fungus Culture With Stain  RELEASE UPON TISA,   TIMED        01/08/24 1254             Author: Delon Herald, MD 01/10/2024 6:35 PM  For on call review [email or web address].

## 2024-01-10 NOTE — Progress Notes (Incomplete)
 Abx therapy with Azithromycin  continues d/t pneumonia.

## 2024-01-10 NOTE — Progress Notes (Signed)
 Occupational Therapy Treatment Patient Details Name: Ronald Haney MRN: [id number] DOB: 07/02/1930 Today's Date: 01/10/2024   History of present illness Ronald Haney is a 89 y.o. male with medical history significant of HTN, HLD, stroke on Plavix , CKD stage II, presented with worsening of malaise shortness of breath.   OT comments  Pt reports he is feeling better today but does not yet feel back to his baseline. In particular he reports becoming SOB with activity and describes achiness in his chest 2/2 coughing. He requires light hand-held assist to transition from supine-sit and reports that getting into/out of bed is normally something that he is able to do easily on his own. Pt is able to doff dirty shirt and don clean shirt sitting EOB with SUPV, does not demonstrate any unsteadiness. He can come into standing with Mod I. He initially uses RW for w/in room ambulation, no LOB, then progresses to ambulating with no UE support and is able to perform simple UE therex in standing with no unsteadiness. Pt reports he is eager to return home and get back out in his garden. Provided educ re: gradually returning to PLOF, paying attention to how he is feeling and how he is breathing and taking rest breaks as needed. Discussed use of RW vs. SPC upon return home. Pt verbalizing understanding. POC remains appropriate.      If plan is discharge home, recommend the following:  A little help with bathing/dressing/bathroom;Assistance with cooking/housework;Assist for transportation;Help with stairs or ramp for entrance   Equipment Recommendations  None recommended by OT    Recommendations for Other Services      Precautions / Restrictions Precautions Precautions: Fall Recall of Precautions/Restrictions: Intact Restrictions Weight Bearing Restrictions Per Provider Order: No       Mobility Bed Mobility Overal bed mobility: Needs Assistance       Supine to sit: Min assist Sit to supine: Modified  independent (Device/Increase time)   General bed mobility comments: Pt requests hand-held assist for coming into sitting from supine    Transfers Overall transfer level: Needs assistance Equipment used: Rolling walker (2 wheels) Transfers: Sit to/from Stand Sit to Stand: Modified independent (Device/Increase time)                 Balance Overall balance assessment: Needs assistance Sitting-balance support: Feet supported Sitting balance-Leahy Scale: Good Sitting balance - Comments: sitting EOB   Standing balance support: No upper extremity supported, Bilateral upper extremity supported, During functional activity Standing balance-Leahy Scale: Good Standing balance comment: Pt initially using RW but is able to release both hands from walker and maintain good static and dynamic standing balance                           ADL either performed or assessed with clinical judgement   ADL Overall ADL's : Needs assistance/impaired     Grooming: Wash/dry hands;Sitting;Modified independent               Upper Body Dressing: Supervision/safety;Sitting/lateral leans                 General ADL Comments: SUPV for grooming, UB dressing tasks    Extremity/Trunk Assessment Upper Extremity Assessment Upper Extremity Assessment: Generalized weakness   Lower Extremity Assessment Lower Extremity Assessment: Generalized weakness        Vision       Perception     Praxis     Communication Communication Communication: Impaired Factors  Affecting Communication: Hearing impaired   Cognition Arousal: Alert Behavior During Therapy: WFL for tasks assessed/performed Cognition: No apparent impairments                               Following commands: Intact        Cueing      Exercises Other Exercises Other Exercises: Educ re: ACE, activity pacing, safe DME use, gradual return to PLOF, home safety    Shoulder Instructions        General Comments      Pertinent Vitals/ Pain       Pain Assessment Pain Assessment: No/denies pain  Home Living                                          Prior Functioning/Environment              Frequency  Min 2X/week        Progress Toward Goals  OT Goals(current goals can now be found in the care plan section)  Progress towards OT goals: Progressing toward goals     Plan      Co-evaluation                 AM-PAC OT 6 Clicks Daily Activity     Outcome Measure   Help from another person eating meals?: None Help from another person taking care of personal grooming?: None Help from another person toileting, which includes using toliet, bedpan, or urinal?: None Help from another person bathing (including washing, rinsing, drying)?: A Little Help from another person to put on and taking off regular upper body clothing?: None Help from another person to put on and taking off regular lower body clothing?: A Little 6 Click Score: 22    End of Session Equipment Utilized During Treatment: Rolling walker (2 wheels)  OT Visit Diagnosis: Muscle weakness (generalized) (M62.81);Other abnormalities of gait and mobility (R26.89)   Activity Tolerance Patient tolerated treatment well   Patient Left in bed;with call bell/phone within reach;with family/visitor present   Nurse Communication          Time: 8683-8670 OT Time Calculation (min): 13 min  Charges: OT General Charges $OT Visit: 1 Visit OT Treatments $Self Care/Home Management : 8-22 mins Suzen Hock, PhD, MS, OTR/L 01/10/24, 2:16 PM

## 2024-01-10 NOTE — Progress Notes (Signed)
 Room Air Trial   SpO2 during ambulation: 86% SpO2 while at rest: 95%  SpO2 during ambulation with 3L via [place]: 93% SpO2 while at rest with 3L via [place]: 97%

## 2024-01-11 ENCOUNTER — Inpatient Hospital Stay

## 2024-01-11 DIAGNOSIS — A419 Sepsis, unspecified organism: Secondary | ICD-10-CM | POA: Diagnosis not present

## 2024-01-11 DIAGNOSIS — J189 Pneumonia, unspecified organism: Secondary | ICD-10-CM | POA: Diagnosis not present

## 2024-01-11 LAB — CBC WITH DIFFERENTIAL/PLATELET
Abs Immature Granulocytes: 0.44 K/uL — ABNORMAL HIGH (ref 0.00–0.07)
Basophils Absolute: 0.1 K/uL (ref 0.0–0.1)
Basophils Relative: 0 %
Eosinophils Absolute: 0.1 K/uL (ref 0.0–0.5)
Eosinophils Relative: 0 %
HCT: 29.4 % — ABNORMAL LOW (ref 39.0–52.0)
Hemoglobin: 9.9 g/dL — ABNORMAL LOW (ref 13.0–17.0)
Immature Granulocytes: 2 %
Lymphocytes Relative: 11 %
Lymphs Abs: 2.7 K/uL (ref 0.7–4.0)
MCH: 28.5 pg (ref 26.0–34.0)
MCHC: 33.7 g/dL (ref 30.0–36.0)
MCV: 84.7 fL (ref 80.0–100.0)
Monocytes Absolute: 1.1 K/uL — ABNORMAL HIGH (ref 0.1–1.0)
Monocytes Relative: 5 %
Neutro Abs: 20.9 K/uL — ABNORMAL HIGH (ref 1.7–7.7)
Neutrophils Relative %: 82 %
Platelets: 288 K/uL (ref 150–400)
RBC: 3.47 MIL/uL — ABNORMAL LOW (ref 4.22–5.81)
RDW: 15.2 % (ref 11.5–15.5)
Smear Review: NORMAL
WBC: 25.3 K/uL — ABNORMAL HIGH (ref 4.0–10.5)
nRBC: 0 % (ref 0.0–0.2)

## 2024-01-11 LAB — BASIC METABOLIC PANEL WITH GFR
Anion gap: 9 (ref 5–15)
BUN: 37 mg/dL — ABNORMAL HIGH (ref 8–23)
CO2: 19 mmol/L — ABNORMAL LOW (ref 22–32)
Calcium: 8.1 mg/dL — ABNORMAL LOW (ref 8.9–10.3)
Chloride: 106 mmol/L (ref 98–111)
Creatinine, Ser: 1.01 mg/dL (ref 0.61–1.24)
GFR, Estimated: >60 mL/min (ref >60)
Glucose, Bld: 100 mg/dL — ABNORMAL HIGH (ref 70–99)
Potassium: 3.5 mmol/L (ref 3.5–5.1)
Sodium: 134 mmol/L — ABNORMAL LOW (ref 135–145)

## 2024-01-11 MED ORDER — GERHARDT'S BUTT CREAM
TOPICAL_CREAM | Freq: Every day | CUTANEOUS | Status: DC
  Filled 2024-01-11: qty 60

## 2024-01-11 NOTE — Progress Notes (Signed)
 Progress Note   Patient: Ronald Haney FMW:[id number] DOB: 1930-11-12 DOA: 01/07/2024     4 DOS: the patient was seen and examined on 01/11/2024   Brief hospital course: 89yo with h/o HTN, HLD, CVA on Plavix , and stage 2 CKD who presented on 7/13 with SOB and fever.  He was found to have RLL PNA and moderate R-sided pleural effusion now s/p thoracentesis.  He remains on 1L [place] O2; 86% on RA, 94% on 2L [place] O2 with ambulation.  Assessment and Plan:  Sepsis due to RLL PNA with acute hypoxic respiratory failure Met criteria w/ leukocytosis, tachypnea, & secondary to right sided pneumonia O2 sats as low as 86%, improved with [place] O2; will wean as tolerated Continue on IV rocephin , azithromycin  Blood cultures NTD Bronchodilators ordered Will request that IS and flutter valve be brought to bedside Now with LLL rhonchi and still uptrending WBC count so will repeat 2-view CXR   Right pleural effusion vs. possible right sided empyema S/p right thoracentesis w/ 1L of dark amber fluid removed on 01/08/24 Pleural fluid cultures NTD  Cytology of pleural fluid with acute inflammation but negative for malignancy Fungus culture is pending   Elevated troponin Likely secondary to demand ischemia Echo on 7/14 with hyperdynamic EF, grade 1 DD Continue on statin, plavix     Hyponatremia 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 Hypovolemic in the setting of acute infection Improved and stable, not clinically relevant   CKD II Elevated creatinine on presentation, consistent with AKI in the setting of hypovolemia/infection Resolved with IVF Appears to be back to baseline at this time Attempt to avoid nephrotoxic medications   HTN Holding home amlodipine , lisinopril  as BP has been WNL  Resumed amlodipine  on 7/17, as BP is uptrending   Anemia of chronic disease Appears to be at/near baseline          Consultants: IR SLP PT OT TOC team   Procedures: Thoracentesis 7/14 Echocardiogram 7/14    Antibiotics: Azithromycin  7/13-18 Cefepime  x 1 Ceftriaxone  7/14-19 Vancomycin  x 1  30 Day Unplanned Readmission Risk Score    Flowsheet Row ED to Hosp-Admission (Current) from 01/07/2024 in [facility]  30 Day Unplanned Readmission Risk Score (%) 16.15 Filed at 01/11/2024 0801    This score is the patient's risk of an unplanned readmission within 30 days of being discharged (0 -100%). The score is based on dignosis, age, lab data, medications, orders, and past utilization.   Low:  0-14.9   Medium: 15-21.9   High: 22-29.9   Extreme: 30 and above           Subjective: Slowly continues to improve.  He is no longer SOB with ambulation to the bathroom without O2.   Objective: Vitals:   01/11/24 0219 01/11/24 0846  BP: (!) 151/63 (!) 140/64  Pulse: 91 77  Resp: 20 (!) 24  Temp: (P) 98.5 F (36.9 C) 97.9 F (36.6 C)  SpO2: 93% 97%    Intake/Output Summary (Last 24 hours) at 01/11/2024 1221 Last data filed at 01/11/2024 0900 Gross per 24 hour  Intake 320 ml  Output --  Net 320 ml   Filed Weights   01/07/24 1342  Weight: 80.2 kg    Exam:  General:  Appears calm and comfortable and is in NAD, on RA when I saw him the second time upon return from the bathroom Eyes:   normal lids, iris ENT:  grossly normal hearing, lips & tongue, mmm Cardiovascular:  RRR. No LE  edema.  Respiratory:   LLL rhonchi.  Normal respiratory effort even after ambulating to the bathroom. Abdomen:  soft, NT, ND Skin:  no rash or induration seen on limited exam Musculoskeletal:  grossly normal tone BUE/BLE, good ROM, no bony abnormality Psychiatric:  grossly normal mood and affect, speech fluent and appropriate, AOx3 Neurologic:  CN 2-12 grossly intact, moves all extremities in coordinated fashion  Data Reviewed: I have reviewed the patient's lab results since admission.  Pertinent labs for today include:   Na++ 134, stable CO2 19, improved BUN  37/Creatinine 1.01/GFR >60, stable WBC 25.3 Hgb 9.9 Pleural fluid culture NTD Pleural fluid cytology negative for malignancy, + for acute inflammation Fungal culture pending    Family Communication: None present initially; son and wife were present when I went back and we spoke at length  Disposition: Status is: Inpatient Remains inpatient appropriate because: ongoing monitoring     Time spent: 50 minutes  Unresulted Labs (From admission, onward)     Start     Ordered   01/12/24 0500  CBC with Differential/Platelet  Tomorrow morning,   R       Question:  Specimen collection method  Answer:  Lab=Lab collect   01/11/24 1221   01/12/24 0500  Basic metabolic panel with GFR  Tomorrow morning,   R       Question:  Specimen collection method  Answer:  Lab=Lab collect   01/11/24 1221   01/08/24 1254  Fungus Culture With Stain  RELEASE UPON TISA,   TIMED        01/08/24 1254             Author: Delon Herald, MD 01/11/2024 12:21 PM  For on call review [email or web address].

## 2024-01-11 NOTE — Progress Notes (Signed)
 Patient had new skin break down on sacral area.  Photos taken and WOCN consult placed, it was determined per WOCN that it was MASD rather then a pressure ulcer and documented as such.

## 2024-01-11 NOTE — Consult Note (Addendum)
 WOC Nurse Consult Note: Performed remotely after assess photos. Reason for Consult: requested to assess a injury on buttock. Wound type: MASD on bilateral buttock. Pressure Injury POA: NA Measurement: erythema area aprox. 10 cm x 8 cm. Wound bed: 100% red on the left buttock, erythema, macerated skin. Drainage (amount, consistency, odor) Minimum amount, serous. Periwound: Redness, peeling skin. Dressing procedure/placement/frequency: Clean with warm water, pat dry, apply Gerhardt butt cream between the folds daily or PRN soiling. Add a sacrum foam dressing on sacrum to prevent further injuries. Change every 3 days or PRN.  If the pt is in risk to develop a pressure injury:  - Turn and repositioning per hospital policy - If the pt is able to go in the chair, add a chair pressure retribution pad.   WOC team will not plan to follow further. Please reconsult if further assistance is needed. Thank-you,  Lela Holm BSN, CNS, RN, [employer], WOCN  (Phone [phone])

## 2024-01-11 NOTE — TOC Initial Note (Addendum)
 Transition of Care [facility]) - Initial/Assessment Note    Patient Details  Name: Ronald Haney MRN: [id number] Date of Birth: 11-21-1930  Transition of Care [facility]) CM/SW Contact:    Ronald ONEIDA Haddock, RN Phone Number: 01/11/2024, 4:19 PM  Clinical Narrative:                   Admitted for: PNA  Admitted from:home with wife  PCP: Ronald Haney  Current home health/prior home health/DME: DME in the home belongs to wife.  Patient request RW for discharge  Therapy recommends Home health.  Patient in agreement and states he does not have a preference of HH agency. Referral made to Ronald Haney  with [facility]   Patient currently weaned to RA Expected Discharge Plan: Home w Home Health Services Barriers to Discharge: Continued Medical Work up   Patient Goals and CMS Choice   CMS Medicare.gov Compare Post Acute Care list provided to:: Patient Choice offered to / list presented to : Patient      Expected Discharge Plan and Services                                   HH Arranged: PT, OT [facility] Agency: [facility] Date [facility] Agency Contacted: 01/11/24   Representative spoke with at [facility] Agency: Ronald Haney   Prior Living Arrangements/Services   Lives with:: Spouse Patient language and need for interpreter reviewed:: Yes Do you feel safe going back to the place where you live?: Yes            Criminal Activity/Legal Involvement Pertinent to Current Situation/Hospitalization: No - Comment as needed  Activities of Daily Living   ADL Screening (condition at time of admission) Independently performs ADLs?: Yes (appropriate for developmental age) Is the patient deaf or have difficulty hearing?: No Does the patient have difficulty seeing, even when wearing glasses/contacts?: No Does the patient have difficulty concentrating, remembering, or making decisions?: Yes  Permission Sought/Granted                  Emotional Assessment       Orientation: : Oriented to Self, Oriented  to Place, Oriented to  Time, Oriented to Situation      Admission diagnosis:  CAP (community acquired pneumonia) [J18.9] Acute respiratory failure with hypoxia (HCC) [J96.01] Patient Active Problem List   Diagnosis Date Noted   CAP (community acquired pneumonia) 01/07/2024   Empyema (HCC) 01/07/2024   Lumbar stenosis with neurogenic claudication 10/12/2020   Visual disturbance 05/01/2018   History of stroke 05/01/2018   HTN (hypertension) 05/01/2018   HLD (hyperlipidemia) 05/01/2018   GERD (gastroesophageal reflux disease) 05/01/2018   CVA (cerebral vascular accident) (HCC) 12/23/2017   PCP:  Ronald Manna, MD Pharmacy:   [facility] [address]), [place] - [address] Ronald Haney [place]) [place] [address] Phone: [phone] Fax: [phone]     Social Drivers of Health (SDOH) Social History: SDOH Screenings   Food Insecurity: No Food Insecurity (01/07/2024)  Housing: Low Risk  (01/08/2024)  Transportation Needs: No Transportation Needs (01/08/2024)  Utilities: Not At Risk (01/08/2024)  Financial Resource Strain: Low Risk  (10/04/2023)   Received from [facility]  Social Connections: Socially Isolated (01/08/2024)  Tobacco Use: Low Risk  (01/07/2024)   SDOH Interventions:     Readmission Risk Interventions     No data to display

## 2024-01-12 ENCOUNTER — Inpatient Hospital Stay

## 2024-01-12 DIAGNOSIS — J189 Pneumonia, unspecified organism: Secondary | ICD-10-CM | POA: Diagnosis not present

## 2024-01-12 DIAGNOSIS — J929 Pleural plaque without asbestos: Secondary | ICD-10-CM | POA: Diagnosis not present

## 2024-01-12 DIAGNOSIS — J9811 Atelectasis: Secondary | ICD-10-CM | POA: Diagnosis not present

## 2024-01-12 DIAGNOSIS — R918 Other nonspecific abnormal finding of lung field: Secondary | ICD-10-CM | POA: Diagnosis not present

## 2024-01-12 DIAGNOSIS — A419 Sepsis, unspecified organism: Secondary | ICD-10-CM | POA: Diagnosis not present

## 2024-01-12 DIAGNOSIS — J90 Pleural effusion, not elsewhere classified: Secondary | ICD-10-CM | POA: Diagnosis not present

## 2024-01-12 LAB — BASIC METABOLIC PANEL WITH GFR
Anion gap: 7 (ref 5–15)
BUN: 40 mg/dL — ABNORMAL HIGH (ref 8–23)
CO2: 22 mmol/L (ref 22–32)
Calcium: 8.1 mg/dL — ABNORMAL LOW (ref 8.9–10.3)
Chloride: 105 mmol/L (ref 98–111)
Creatinine, Ser: 1.08 mg/dL (ref 0.61–1.24)
GFR, Estimated: >60 mL/min (ref >60)
Glucose, Bld: 100 mg/dL — ABNORMAL HIGH (ref 70–99)
Potassium: 3.5 mmol/L (ref 3.5–5.1)
Sodium: 134 mmol/L — ABNORMAL LOW (ref 135–145)

## 2024-01-12 LAB — CULTURE, BLOOD (ROUTINE X 2)
Culture: NO GROWTH
Culture: NO GROWTH
Special Requests: ADEQUATE
Special Requests: ADEQUATE

## 2024-01-12 LAB — CBC WITH DIFFERENTIAL/PLATELET
Abs Immature Granulocytes: 0.68 K/uL — ABNORMAL HIGH (ref 0.00–0.07)
Basophils Absolute: 0.1 K/uL (ref 0.0–0.1)
Basophils Relative: 0 %
Eosinophils Absolute: 0.1 K/uL (ref 0.0–0.5)
Eosinophils Relative: 0 %
HCT: 28.6 % — ABNORMAL LOW (ref 39.0–52.0)
Hemoglobin: 9.7 g/dL — ABNORMAL LOW (ref 13.0–17.0)
Immature Granulocytes: 3 %
Lymphocytes Relative: 14 %
Lymphs Abs: 3.3 K/uL (ref 0.7–4.0)
MCH: 29 pg (ref 26.0–34.0)
MCHC: 33.9 g/dL (ref 30.0–36.0)
MCV: 85.6 fL (ref 80.0–100.0)
Monocytes Absolute: 1 K/uL (ref 0.1–1.0)
Monocytes Relative: 4 %
Neutro Abs: 18.2 K/uL — ABNORMAL HIGH (ref 1.7–7.7)
Neutrophils Relative %: 79 %
Platelets: 275 K/uL (ref 150–400)
RBC: 3.34 MIL/uL — ABNORMAL LOW (ref 4.22–5.81)
RDW: 15.7 % — ABNORMAL HIGH (ref 11.5–15.5)
WBC: 23.4 K/uL — ABNORMAL HIGH (ref 4.0–10.5)
nRBC: 0 % (ref 0.0–0.2)

## 2024-01-12 LAB — BODY FLUID CULTURE W GRAM STAIN: Culture: NO GROWTH

## 2024-01-12 MED ORDER — IOHEXOL 300 MG/ML  SOLN
75.0000 mL | Freq: Once | INTRAMUSCULAR | Status: AC | PRN
  Administered 2024-01-12: 75 mL via INTRAVENOUS

## 2024-01-12 MED ORDER — DOCUSATE SODIUM 100 MG PO CAPS
100.0000 mg | ORAL_CAPSULE | Freq: Every day | ORAL | Status: DC
  Administered 2024-01-12 – 2024-01-13 (×2): 100 mg via ORAL
  Filled 2024-01-12 (×2): qty 1

## 2024-01-12 MED ORDER — LIDOCAINE HCL (PF) 1 % IJ SOLN
10.0000 mL | Freq: Once | INTRAMUSCULAR | Status: AC
  Administered 2024-01-12: 10 mL via INTRADERMAL

## 2024-01-12 NOTE — Progress Notes (Signed)
 Physical Therapy Treatment Patient Details Name: Ronald Haney MRN: [id number] DOB: 07-10-1930 Today's Date: 01/12/2024   History of Present Illness Ronald Haney is a 89 y.o. male with medical history significant of HTN, HLD, stroke on Plavix , CKD stage II, presented with worsening of malaise shortness of breath.    PT Comments  Today's tx involved continuation of ambulation with RW to progress overall activity tolerance and strength. Pt vitals monitored throughout session on RA, pt not requiring sup oxygen this date. Ambulation 2x120' successful this date, pt SpO2 vitals remaining between 88-92%, no SOB experienced. Pt left in recliner and is overall progressing toward goals. PT to follow acutely as appropriate.     If plan is discharge home, recommend the following: A little help with walking and/or transfers;A little help with bathing/dressing/bathroom;Assist for transportation;Help with stairs or ramp for entrance   Can travel by private vehicle        Equipment Recommendations  None recommended by PT    Recommendations for Other Services       Precautions / Restrictions Precautions Precautions: Fall Recall of Precautions/Restrictions: Intact Restrictions Weight Bearing Restrictions Per Provider Order: No     Mobility  Bed Mobility               General bed mobility comments: NT, found standing with OT    Transfers Overall transfer level: Needs assistance Equipment used: Rolling walker (2 wheels) Transfers: Sit to/from Stand Sit to Stand: Contact guard assist           General transfer comment: STS with CGA, no LOB experienced    Ambulation/Gait Ambulation/Gait assistance: Contact guard assist Gait Distance (Feet): 240 Feet Assistive device: Rolling walker (2 wheels) Gait Pattern/deviations: Step-through pattern, Decreased step length - right, Decreased step length - left       General Gait Details: 2 x120' with RW, no oxygen, SpO2 monitored  throughout on RA ranging 88-92%, pt asymptomatic   Stairs             Wheelchair Mobility     Tilt Bed    Modified Rankin (Stroke Patients Only)       Balance Overall balance assessment: Needs assistance Sitting-balance support: Feet supported Sitting balance-Leahy Scale: Good Sitting balance - Comments: sitting EOB   Standing balance support: Bilateral upper extremity supported Standing balance-Leahy Scale: Fair Standing balance comment: use of RW                            Communication Communication Communication: Impaired Factors Affecting Communication: Hearing impaired  Cognition Arousal: Alert Behavior During Therapy: WFL for tasks assessed/performed   PT - Cognitive impairments: No apparent impairments                         Following commands: Intact      Cueing Cueing Techniques: Verbal cues, Tactile cues  Exercises      General Comments        Pertinent Vitals/Pain Pain Assessment Pain Assessment: No/denies pain    Home Living                          Prior Function            PT Goals (current goals can now be found in the care plan section) Acute Rehab PT Goals Patient Stated Goal: to breathe better and get stronger PT Goal Formulation:  With patient Time For Goal Achievement: 01/22/24 Potential to Achieve Goals: Good Progress towards PT goals: Progressing toward goals    Frequency    Min 2X/week      PT Plan      Co-evaluation              AM-PAC PT 6 Clicks Mobility   Outcome Measure  Help needed turning from your back to your side while in a flat bed without using bedrails?: None Help needed moving from lying on your back to sitting on the side of a flat bed without using bedrails?: None Help needed moving to and from a bed to a chair (including a wheelchair)?: A Little Help needed standing up from a chair using your arms (e.g., wheelchair or bedside chair)?: A Little Help  needed to walk in hospital room?: A Little Help needed climbing 3-5 steps with a railing? : A Little 6 Click Score: 20    End of Session   Activity Tolerance: Patient tolerated treatment well Patient left: in bed;with call bell/phone within reach;with family/visitor present Nurse Communication: Mobility status PT Visit Diagnosis: Unsteadiness on feet (R26.81);Muscle weakness (generalized) (M62.81);Other abnormalities of gait and mobility (R26.89)     Time: 1040-1059 PT Time Calculation (min) (ACUTE ONLY): 19 min  Charges:                              Sherida Dobkins Romero-Perozo, SPT  01/12/2024, 11:34 AM

## 2024-01-12 NOTE — Consult Note (Signed)
 PULMONOLOGY         Date: 01/12/2024,   MRN# [id number] Ronald Haney 1930-12-31     AdmissionWeight: 80.2 kg                 CurrentWeight: 80.2 kg  Referring provider: Dr Barbarann   CHIEF COMPLAINT:   Acute on chronic hypoxemic respiratory failure   HISTORY OF PRESENT ILLNESS   This is a 89 yo M with hx of hypertension, dyslipidemia, acute CVA, CKD who came in with flu like illness and complaints of worsening dyspnea.  He also described chest pains for few days prior to arrival to ER. He did have outpatient therapy with antibiotics but despite that continued to decline and his son expressed concern when noting that patient developed labored breathing. In ED he was tachypneic and required supplemental Oxygen due to hypoxemia.  He had CXR with right lung opacity noted consistent with pneumonia.  He further had CT chest angio which I reviewed personally.  This shows right moderate pleural effusion with associated RLL complete atelectasis cannot rule out empyema and a background of moderate hiatal hernia with cardiomegaly.  He does have leukocytosis with neutrophil predominance on differential. He had 1L of amber fluid removed via right thoracentesis. Despite the procedure he is still dyspneic and his chest x ray shows re-accumulation of effusion on right. PCCM for further review and management.     PAST MEDICAL HISTORY   Past Medical History:  Diagnosis Date   GERD (gastroesophageal reflux disease)    History of hiatal hernia    Hyperlipemia    Hypertension    Skin cancer    face and arms- squam   Stroke (HCC) 2019   vision- has returned to 99%     SURGICAL HISTORY   Past Surgical History:  Procedure Laterality Date   ANKLE SURGERY Right    ccy     CHOLECYSTECTOMY     COLONOSCOPY     LUMBAR LAMINECTOMY/DECOMPRESSION MICRODISCECTOMY Bilateral 10/12/2020   Procedure: Laminectomy and Foraminotomy - Lumbar four-Lumbar five - bilateral;  Surgeon: Colon Shove, MD;   Location: [facility] OR;  Service: Neurosurgery;  Laterality: Bilateral;   TRANSURETHRAL RESECTION OF PROSTATE       FAMILY HISTORY   Family History  Problem Relation Age of Onset   CAD Father      SOCIAL HISTORY   Social History   Tobacco Use   Smoking status: Never   Smokeless tobacco: Never  Vaping Use   Vaping status: Never Used  Substance Use Topics   Alcohol  use: Not Currently   Drug use: Not Currently     MEDICATIONS    Home Medication:    Current Medication:  Current Facility-Administered Medications:    albuterol  (PROVENTIL ) (2.5 MG/3ML) 0.083% nebulizer solution 3 mL, 3 mL, Inhalation, Q6H PRN, Laurita Manor T, MD   amLODipine  (NORVASC ) tablet 10 mg, 10 mg, Oral, Daily, Barbarann Nest, MD, 10 mg at 01/11/24 1003   atorvastatin  (LIPITOR) tablet 40 mg, 40 mg, Oral, q AM, Laurita Manor T, MD, 40 mg at 01/11/24 1003   benzonatate  (TESSALON ) capsule 100 mg, 100 mg, Oral, TID PRN, Zhang, Ping T, MD   clopidogrel  (PLAVIX ) tablet 75 mg, 75 mg, Oral, q AM, Laurita Manor T, MD, 75 mg at 01/11/24 1003   enoxaparin  (LOVENOX ) injection 40 mg, 40 mg, Subcutaneous, Q24H, Zhang, Ping T, MD, 40 mg at 01/11/24 2110   finasteride  (PROSCAR ) tablet 5 mg, 5 mg, Oral, Daily, Zhang,  Cort DASEN, MD, 5 mg at 01/11/24 1003   furosemide  (LASIX ) tablet 20 mg, 20 mg, Oral, Daily, Trudy Coop M, MD, 20 mg at 01/11/24 1003   Gerhardt's butt cream, , Topical, Daily, Barbarann Nest, MD   guaiFENesin  (MUCINEX ) 12 hr tablet 1,200 mg, 1,200 mg, Oral, BID, Zhang, Ping T, MD, 1,200 mg at 01/11/24 2111   hydrALAZINE  (APRESOLINE ) injection 5 mg, 5 mg, Intravenous, Q6H PRN, Laurita Cort T, MD   olopatadine  (PATANOL) 0.1 % ophthalmic solution 1 drop, 1 drop, Both Eyes, BID, Barbarann Nest, MD, 1 drop at 01/11/24 2112   ondansetron  (ZOFRAN ) tablet 4 mg, 4 mg, Oral, Q6H PRN **OR** ondansetron  (ZOFRAN ) injection 4 mg, 4 mg, Intravenous, Q6H PRN, Laurita Cort T, MD   pantoprazole  (PROTONIX ) EC tablet 40 mg, 40 mg,  Oral, Daily, Laurita Cort T, MD, 40 mg at 01/11/24 2111   traZODone  (DESYREL ) tablet 25 mg, 25 mg, Oral, QHS PRN, Mansy, Jan A, MD, 25 mg at 01/11/24 2111    ALLERGIES   Patient has no known allergies.     REVIEW OF SYSTEMS    Review of Systems:  Gen:  Denies  fever, sweats, chills weigh loss  HEENT: Denies blurred vision, double vision, ear pain, eye pain, hearing loss, nose bleeds, sore throat Cardiac:  No dizziness, chest pain or heaviness, chest tightness,edema Resp:   reports dyspnea chronically  Gi: Denies swallowing difficulty, stomach pain, nausea or vomiting, diarrhea, constipation, bowel incontinence Gu:  Denies bladder incontinence, burning urine Ext:   Denies Joint pain, stiffness or swelling Skin: Denies  skin rash, easy bruising or bleeding or hives Endoc:  Denies polyuria, polydipsia , polyphagia or weight change Psych:   Denies depression, insomnia or hallucinations   Other:  All other systems negative   VS: BP (!) 141/62   Pulse 73   Temp 98.2 F (36.8 C)   Resp 18   Ht 5' 10 (1.778 m)   Wt 80.2 kg   SpO2 93%   BMI 25.37 kg/m      PHYSICAL EXAM    GENERAL:NAD, no fevers, chills, no weakness no fatigue HEAD: Normocephalic, atraumatic.  EYES: Pupils equal, round, reactive to light. Extraocular muscles intact. No scleral icterus.  MOUTH: Moist mucosal membrane. Dentition intact. No abscess noted.  EAR, NOSE, THROAT: Clear without exudates. No external lesions.  NECK: Supple. No thyromegaly. No nodules. No JVD.  PULMONARY: decreased breath sounds with mild rhonchi worse at bases bilaterally.  CARDIOVASCULAR: S1 and S2. Regular rate and rhythm. No murmurs, rubs, or gallops. No edema. Pedal pulses 2+ bilaterally.  GASTROINTESTINAL: Soft, nontender, nondistended. No masses. Positive bowel sounds. No hepatosplenomegaly.  MUSCULOSKELETAL: No swelling, clubbing, or edema. Range of motion full in all extremities.  NEUROLOGIC: Cranial nerves II through  XII are intact. No gross focal neurological deficits. Sensation intact. Reflexes intact.  SKIN: No ulceration, lesions, rashes, or cyanosis. Skin warm and dry. Turgor intact.  PSYCHIATRIC: Mood, affect within normal limits. The patient is awake, alert and oriented x 3. Insight, judgment intact.       IMAGING   Narrative & Impression  CLINICAL DATA:  Hypoxia   EXAM: CHEST - 2 VIEW   COMPARISON:  Chest radiograph dated 01/08/2024   FINDINGS: Low lung volumes with bronchovascular crowding. Increased asymmetric hazy right lung opacities. Left basilar linear opacity. Similar loculated right pleural effusion. Blunting of the left costophrenic angle. No pneumothorax. The heart size and mediastinal contours are within normal limits. No acute osseous abnormality.  IMPRESSION: 1. Increased asymmetric hazy right lung opacities, likely atelectasis. 2. Similar loculated right pleural effusion. 3. Blunting of the left costophrenic angle may represent trace pleural effusion. Left basilar atelectasis.     Electronically Signed   By: Limin  Xu M.D.   On: 01/11/2024 16:52      ASSESSMENT/PLAN   Right parapneumonic effusion   -s/p thoracentesis - 1L amber colored fluid removed  -neutrophil predominant exudate profile consistent with possible pneumonia -may need tpa/dornase intrapleural instillation, currently patient has negative cultures -cytology negative for malignancy but positive for acute inflammation  - due to re-accumuation post thoracentesis, there is some concern for either rapidly re-accumulating effusion secondary to pneumonia vs development of hemothorax post procedure.  - will order repeat tap and may need pigtail drainage      Thank you for allowing me to participate in the care of this patient.   Patient/Family are satisfied with care plan and all questions have been answered.    Provider disclosure: Patient with at least one acute or chronic illness or injury  that poses a threat to life or bodily function and is being managed actively during this encounter.  All of the below services have been performed independently by signing provider:  review of prior documentation from internal and or external health records.  Review of previous and current lab results.  Interview and comprehensive assessment during patient visit today. Review of current and previous chest radiographs/CT scans. Discussion of management and test interpretation with health care team and patient/family.   This document was prepared using Dragon voice recognition software and may include unintentional dictation errors.     Vana Arif, M.D.  Division of Pulmonary & Critical Care Medicine

## 2024-01-12 NOTE — Procedures (Signed)
 PROCEDURE SUMMARY:  Successful image-guided right-sided diagnostic and therapeutic thoracentesis. Yielded 0.00 liters of pleural fluid. The needle tip was noted in the pleural space, but I was unable to draw back any fluid.  Patient tolerated procedure well. EBL: Zero No immediate complications.  Post procedure CXR is pending.  Please see imaging section of Epic for full dictation.  Carlin LABOR Seeley Hissong PA-C 01/12/2024 3:55 PM

## 2024-01-12 NOTE — Progress Notes (Signed)
 Occupational Therapy Treatment Patient Details Name: Ronald Haney MRN: [id number] DOB: Jun 02, 1931 Today's Date: 01/12/2024   History of present illness Ronald Haney is a 89 y.o. male with medical history significant of HTN, HLD, stroke on Plavix , CKD stage II, presented with worsening of malaise shortness of breath.   OT comments  Pt seen for OT Tx. Pt seated EOB with family in room visiting. Pt denies complaints, eager to return home. Pt required SBA-CGA for mobility in the room with pt intermittently reaching for fingertip touch on footboard of the bed. Pt stood at the sink to shave his face using an electric razor and wash his face afterwards, requiring supv for safety. No overt LOB noted. SpO2 92-95% on RA, HR around 100. VC to use L hand on sink for stability if needed. Pt denied SOB throughout. Progressing well towards goals. Left with PT for further treatment.       If plan is discharge home, recommend the following:  A little help with bathing/dressing/bathroom;Assistance with cooking/housework;Assist for transportation;Help with stairs or ramp for entrance   Equipment Recommendations  None recommended by OT    Recommendations for Other Services      Precautions / Restrictions Precautions Precautions: Fall Recall of Precautions/Restrictions: Intact Restrictions Weight Bearing Restrictions Per Provider Order: No       Mobility Bed Mobility     General bed mobility comments: NT, seated EOB upon arrival, left seated EOB with PT at end of session    Transfers Overall transfer level: Needs assistance Equipment used: None Transfers: Sit to/from Stand Sit to Stand: Contact guard assist         Balance Overall balance assessment: Needs assistance Sitting-balance support: Feet supported Sitting balance-Leahy Scale: Good     Standing balance support: Single extremity supported, No upper extremity supported Standing balance-Leahy Scale: Fair         ADL either  performed or assessed with clinical judgement   ADL Overall ADL's : Needs assistance/impaired     Grooming: Standing;Wash/dry face;Supervision/safety Grooming Details (indicate cue type and reason): Pt stood at the sink to shave his face using an electric razor and wash his face afterwards, requiring supv for safety. No overt LOB noted. SpO2 92-95% on RA, HR around 100. VC to use L hand on sink for stability if needed.          Communication Communication Communication: Impaired Factors Affecting Communication: Hearing impaired   Cognition Arousal: Alert Behavior During Therapy: WFL for tasks assessed/performed Cognition: No apparent impairments      Following commands: Intact        Cueing   Cueing Techniques: Verbal cues             Pertinent Vitals/ Pain       Pain Assessment Pain Assessment: No/denies pain   Frequency  Min 2X/week        Progress Toward Goals  OT Goals(current goals can now be found in the care plan section)  Progress towards OT goals: Progressing toward goals  Acute Rehab OT Goals Patient Stated Goal: go home and get back to gardening OT Goal Formulation: With patient Time For Goal Achievement: 01/23/24 Potential to Achieve Goals: Good  Plan         AM-PAC OT 6 Clicks Daily Activity     Outcome Measure   Help from another person eating meals?: None Help from another person taking care of personal grooming?: None Help from another person toileting, which includes using toliet, bedpan,  or urinal?: None Help from another person bathing (including washing, rinsing, drying)?: A Little Help from another person to put on and taking off regular upper body clothing?: None Help from another person to put on and taking off regular lower body clothing?: A Little 6 Click Score: 22    End of Session    OT Visit Diagnosis: Muscle weakness (generalized) (M62.81);Other abnormalities of gait and mobility (R26.89)   Activity Tolerance  Patient tolerated treatment well   Patient Left in bed;with call bell/phone within reach;with family/visitor present;Other (comment) (EOB with PT)   Nurse Communication          Time: 8966-8957 OT Time Calculation (min): 9 min  Charges: OT General Charges $OT Visit: 1 Visit OT Treatments $Self Care/Home Management : 8-22 mins  Warren SAUNDERS., MPH, MS, OTR/L ascom [phone] 01/12/24, 10:51 AM

## 2024-01-12 NOTE — Progress Notes (Signed)
 Progress Note   Patient: Ronald Haney FMW:[id number] DOB: 06-13-1931 DOA: 01/07/2024     5 DOS: the patient was seen and examined on 01/12/2024   Brief hospital course: 89yo with h/o HTN, HLD, CVA on Plavix , and stage 2 CKD who presented on 7/13 with SOB and fever.  He was found to have RLL PNA and moderate R-sided pleural effusion now s/p thoracentesis.  He remains on 1L [place] O2; 86% on RA, 94% on 2L [place] O2 with ambulation.  Assessment and Plan:  Sepsis due to RLL PNA with acute hypoxic respiratory failure Met criteria w/ leukocytosis, tachypnea, & secondary to right sided pneumonia O2 sats as low as 86%, improved with  O2; will wean as tolerated Continue on IV rocephin , azithromycin  Blood cultures NTD Bronchodilators ordered Will request that IS and flutter valve be brought to bedside Still uptrending WBC count so repeated 2-view CXR on 7/17 which showed apparent improvement in PNA but recurrent effusion   Right pleural effusion vs. possible right sided empyema S/p right thoracentesis w/ 1L of dark amber fluid removed on 01/08/24 Pleural fluid cultures NTD  Cytology of pleural fluid with acute inflammation but negative for malignancy Fungus culture is pending Repeat CXR with recurrent effusion IR thoracentesis ordered but the pocket was not accessible to drainage Dr. Hughes recommends CT chest with contrast and then discussion with IR tomorrow for consideration of pigtail/lytics vs. CT surgery consult   Elevated troponin Likely secondary to demand ischemia Echo on 7/14 with hyperdynamic EF, grade 1 DD Continue on statin, plavix     Hyponatremia 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 Hypovolemic in the setting of acute infection Improved and stable, not clinically relevant   CKD II Elevated creatinine on presentation, consistent with AKI in the setting of hypovolemia/infection Resolved with IVF Appears to be back to baseline at this time Attempt to avoid nephrotoxic medications    HTN Holding home amlodipine , lisinopril  as BP has been WNL  Resumed amlodipine  on 7/17, as BP is uptrending   Anemia of chronic disease Appears to be at/near baseline          Consultants: IR SLP PT OT TOC team   Procedures: Thoracentesis 7/14 Echocardiogram 7/14   Antibiotics: Azithromycin  7/13-18 Cefepime  x 1 Ceftriaxone  7/14-19 Vancomycin  x 1   30 Day Unplanned Readmission Risk Score    Flowsheet Row ED to Hosp-Admission (Current) from 01/07/2024 in [facility]  30 Day Unplanned Readmission Risk Score (%) 16.47 Filed at 01/12/2024 1600    This score is the patient's risk of an unplanned readmission within 30 days of being discharged (0 -100%). The score is based on dignosis, age, lab data, medications, orders, and past utilization.   Low:  0-14.9   Medium: 15-21.9   High: 22-29.9   Extreme: 30 and above           Subjective: Continues to improve, still not back to baseline.  Eager to go home when he is ready but understands ongoing evaluation.   Objective: Vitals:   01/12/24 1511 01/12/24 1558  BP: (!) 145/64 (!) 143/62  Pulse: 94 92  Resp: 16   Temp: 98.1 F (36.7 C)   SpO2: 92% 92%    Intake/Output Summary (Last 24 hours) at 01/12/2024 1730 Last data filed at 01/12/2024 0900 Gross per 24 hour  Intake 120 ml  Output --  Net 120 ml   Filed Weights   01/07/24 1342  Weight: 80.2 kg    Exam:  General:  Appears calm and comfortable and is in NAD, on RA  Eyes:   normal lids, iris ENT:  grossly normal hearing, lips & tongue, mmm Cardiovascular:  RRR. No LE edema.  Respiratory:   RLL with diminished breath sounds.  Normal respiratory effort. Abdomen:  soft, NT, ND Skin:  no rash or induration seen on limited exam Musculoskeletal:  grossly normal tone BUE/BLE, good ROM, no bony abnormality Psychiatric:  grossly normal mood and affect, speech fluent and appropriate, AOx3 Neurologic:  CN 2-12 grossly intact,  moves all extremities in coordinated fashion  Data Reviewed: I have reviewed the patient's lab results since admission.  Pertinent labs for today include:  Na++ 134, stable BUN 40/Creatinine 1.08/GFR >60 WBC 23.4, down from 25.3 Hgb 9.7 Pleural fluid culture NTD Fungal culture pending Cytology negative      Family Communication: Son was present throughout evaluation  Disposition: Status is: Inpatient Remains inpatient appropriate because: ongoing management     Time spent: 50 minutes  Unresulted Labs (From admission, onward)     Start     Ordered   01/13/24 0500  CBC with Differential/Platelet  Tomorrow morning,   R       Question:  Specimen collection method  Answer:  Lab=Lab collect   01/12/24 1632   01/13/24 0500  Basic metabolic panel with GFR  Tomorrow morning,   R       Question:  Specimen collection method  Answer:  Lab=Lab collect   01/12/24 1632   01/13/24 0500  Procalcitonin  Tomorrow morning,   R       References:    Procalcitonin Lower Respiratory Tract Infection AND Sepsis Procalcitonin Algorithm  Question:  Specimen collection method  Answer:  Lab=Lab collect   01/12/24 1632             Author: Delon Herald, MD 01/12/2024 5:30 PM  For on call review [email or web address].

## 2024-01-13 DIAGNOSIS — N400 Enlarged prostate without lower urinary tract symptoms: Secondary | ICD-10-CM | POA: Diagnosis present

## 2024-01-13 DIAGNOSIS — J189 Pneumonia, unspecified organism: Secondary | ICD-10-CM | POA: Diagnosis not present

## 2024-01-13 LAB — BASIC METABOLIC PANEL WITH GFR
Anion gap: 9 (ref 5–15)
BUN: 39 mg/dL — ABNORMAL HIGH (ref 8–23)
CO2: 21 mmol/L — ABNORMAL LOW (ref 22–32)
Calcium: 8.2 mg/dL — ABNORMAL LOW (ref 8.9–10.3)
Chloride: 106 mmol/L (ref 98–111)
Creatinine, Ser: 1.08 mg/dL (ref 0.61–1.24)
GFR, Estimated: >60 mL/min (ref >60)
Glucose, Bld: 95 mg/dL (ref 70–99)
Potassium: 3.5 mmol/L (ref 3.5–5.1)
Sodium: 136 mmol/L (ref 135–145)

## 2024-01-13 LAB — CBC WITH DIFFERENTIAL/PLATELET
Abs Immature Granulocytes: 0.62 K/uL — ABNORMAL HIGH (ref 0.00–0.07)
Basophils Absolute: 0.1 K/uL (ref 0.0–0.1)
Basophils Relative: 0 %
Eosinophils Absolute: 0.1 K/uL (ref 0.0–0.5)
Eosinophils Relative: 0 %
HCT: 27.7 % — ABNORMAL LOW (ref 39.0–52.0)
Hemoglobin: 9.4 g/dL — ABNORMAL LOW (ref 13.0–17.0)
Immature Granulocytes: 3 %
Lymphocytes Relative: 17 %
Lymphs Abs: 3.5 K/uL (ref 0.7–4.0)
MCH: 28.7 pg (ref 26.0–34.0)
MCHC: 33.9 g/dL (ref 30.0–36.0)
MCV: 84.5 fL (ref 80.0–100.0)
Monocytes Absolute: 1 K/uL (ref 0.1–1.0)
Monocytes Relative: 5 %
Neutro Abs: 15.9 K/uL — ABNORMAL HIGH (ref 1.7–7.7)
Neutrophils Relative %: 75 %
Platelets: 275 K/uL (ref 150–400)
RBC: 3.28 MIL/uL — ABNORMAL LOW (ref 4.22–5.81)
RDW: 15.6 % — ABNORMAL HIGH (ref 11.5–15.5)
WBC: 21.1 K/uL — ABNORMAL HIGH (ref 4.0–10.5)
nRBC: 0 % (ref 0.0–0.2)

## 2024-01-13 LAB — PROCALCITONIN: Procalcitonin: 0.45 ng/mL

## 2024-01-13 MED ORDER — ACETAMINOPHEN 325 MG PO TABS
650.0000 mg | ORAL_TABLET | Freq: Four times a day (QID) | ORAL | Status: DC | PRN
  Administered 2024-01-13: 650 mg via ORAL
  Filled 2024-01-13: qty 2

## 2024-01-13 MED ORDER — GUAIFENESIN ER 600 MG PO TB12: 1200.0000 mg | ORAL_TABLET | Freq: Two times a day (BID) | ORAL | 0 refills | Status: AC

## 2024-01-13 MED ORDER — DOCUSATE SODIUM 100 MG PO CAPS: 100.0000 mg | ORAL_CAPSULE | Freq: Two times a day (BID) | ORAL | 0 refills | Status: AC

## 2024-01-13 MED ORDER — AMOXICILLIN-POT CLAVULANATE 875-125 MG PO TABS: 1.0000 | ORAL_TABLET | Freq: Two times a day (BID) | ORAL | 0 refills | Status: AC

## 2024-01-13 MED ORDER — GERHARDT'S BUTT CREAM: 1.0000 | TOPICAL_CREAM | Freq: Every day | CUTANEOUS | 0 refills | Status: AC

## 2024-01-13 MED ORDER — MAGNESIUM HYDROXIDE 400 MG/5ML PO SUSP
15.0000 mL | Freq: Every day | ORAL | Status: DC | PRN
  Administered 2024-01-13: 15 mL via ORAL
  Filled 2024-01-13: qty 30

## 2024-01-13 MED ORDER — GERHARDT'S BUTT CREAM: 1.0000 | TOPICAL_CREAM | Freq: Every day | CUTANEOUS | 0 refills | Status: DC

## 2024-01-13 NOTE — Progress Notes (Signed)
 PULMONOLOGY         Date: 01/13/2024,   MRN# [id number] Ronald Haney 06/27/1931     AdmissionWeight: 80.2 kg                 CurrentWeight: 80.2 kg  Referring provider: Dr Barbarann   CHIEF COMPLAINT:   Acute on chronic hypoxemic respiratory failure   HISTORY OF PRESENT ILLNESS   This is a 89 yo M with hx of hypertension, dyslipidemia, acute CVA, CKD who came in with flu like illness and complaints of worsening dyspnea.  He also described chest pains for few days prior to arrival to ER. He did have outpatient therapy with antibiotics but despite that continued to decline and his son expressed concern when noting that patient developed labored breathing. In ED he was tachypneic and required supplemental Oxygen due to hypoxemia.  He had CXR with right lung opacity noted consistent with pneumonia.  He further had CT chest angio which I reviewed personally.  This shows right moderate pleural effusion with associated RLL complete atelectasis cannot rule out empyema and a background of moderate hiatal hernia with cardiomegaly.  He does have leukocytosis with neutrophil predominance on differential. He had 1L of amber fluid removed via right thoracentesis. Despite the procedure he is still dyspneic and his chest x ray shows re-accumulation of effusion on right. PCCM for further review and management.    01/13/24- patient had repeat CT chest post thoracentesis with findings of what appears to be a fluid collection/abscess of right lung.  There is also come concern for possible neoplasm related lesion.  Patient is stable and requests home discharge.  We discussed findings and plan is to treat with augmentin  BID x 14d and follow up in clinic for confirmation of resolution of RLL lesion vs cancer workup.   PAST MEDICAL HISTORY   Past Medical History:  Diagnosis Date   GERD (gastroesophageal reflux disease)    History of hiatal hernia    Hyperlipemia    Hypertension    Skin cancer     face and arms- squam   Stroke (HCC) 2019   vision- has returned to 99%     SURGICAL HISTORY   Past Surgical History:  Procedure Laterality Date   ANKLE SURGERY Right    ccy     CHOLECYSTECTOMY     COLONOSCOPY     LUMBAR LAMINECTOMY/DECOMPRESSION MICRODISCECTOMY Bilateral 10/12/2020   Procedure: Laminectomy and Foraminotomy - Lumbar four-Lumbar five - bilateral;  Surgeon: Colon Shove, MD;  Location: [facility] OR;  Service: Neurosurgery;  Laterality: Bilateral;   TRANSURETHRAL RESECTION OF PROSTATE       FAMILY HISTORY   Family History  Problem Relation Age of Onset   CAD Father      SOCIAL HISTORY   Social History   Tobacco Use   Smoking status: Never   Smokeless tobacco: Never  Vaping Use   Vaping status: Never Used  Substance Use Topics   Alcohol  use: Not Currently   Drug use: Not Currently     MEDICATIONS    Home Medication:    Current Medication:  Current Facility-Administered Medications:    acetaminophen  (TYLENOL ) tablet 650 mg, 650 mg, Oral, Q6H PRN, Jesus America, NP, 650 mg at 01/13/24 0510   albuterol  (PROVENTIL ) (2.5 MG/3ML) 0.083% nebulizer solution 3 mL, 3 mL, Inhalation, Q6H PRN, Laurita Manor T, MD   amLODipine  (NORVASC ) tablet 10 mg, 10 mg, Oral, Daily, Barbarann Nest, MD, 10 mg at 01/12/24 0945  atorvastatin  (LIPITOR) tablet 40 mg, 40 mg, Oral, q AM, Zhang, Ping T, MD, 40 mg at 01/12/24 0945   benzonatate  (TESSALON ) capsule 100 mg, 100 mg, Oral, TID PRN, Zhang, Ping T, MD   clopidogrel  (PLAVIX ) tablet 75 mg, 75 mg, Oral, q AM, Laurita Manor T, MD, 75 mg at 01/12/24 0945   docusate sodium  (COLACE) capsule 100 mg, 100 mg, Oral, Daily, Suellen Durocher, MD, 100 mg at 01/12/24 0951   enoxaparin  (LOVENOX ) injection 40 mg, 40 mg, Subcutaneous, Q24H, Zhang, Ping T, MD, 40 mg at 01/12/24 2133   finasteride  (PROSCAR ) tablet 5 mg, 5 mg, Oral, Daily, Laurita Manor T, MD, 5 mg at 01/12/24 0945   furosemide  (LASIX ) tablet 20 mg, 20 mg, Oral, Daily, Trudy Anthony HERO, MD, 20 mg at 01/12/24 0945   Gerhardt's butt cream, , Topical, Daily, Barbarann Nest, MD, Given at 01/12/24 [phone]   guaiFENesin  (MUCINEX ) 12 hr tablet 1,200 mg, 1,200 mg, Oral, BID, Laurita Manor T, MD, 1,200 mg at 01/12/24 2132   hydrALAZINE  (APRESOLINE ) injection 5 mg, 5 mg, Intravenous, Q6H PRN, Laurita Manor T, MD   magnesium  hydroxide (MILK OF MAGNESIA) suspension 15 mL, 15 mL, Oral, Daily PRN, Jesus America, NP, 15 mL at 01/13/24 0510   olopatadine  (PATANOL) 0.1 % ophthalmic solution 1 drop, 1 drop, Both Eyes, BID, Barbarann Nest, MD, 1 drop at 01/12/24 2134   ondansetron  (ZOFRAN ) tablet 4 mg, 4 mg, Oral, Q6H PRN **OR** ondansetron  (ZOFRAN ) injection 4 mg, 4 mg, Intravenous, Q6H PRN, Laurita Manor T, MD   pantoprazole  (PROTONIX ) EC tablet 40 mg, 40 mg, Oral, Daily, Laurita Manor T, MD, 40 mg at 01/12/24 2133   traZODone  (DESYREL ) tablet 25 mg, 25 mg, Oral, QHS PRN, Mansy, Jan A, MD, 25 mg at 01/12/24 2132    ALLERGIES   Patient has no known allergies.     REVIEW OF SYSTEMS    Review of Systems:  Gen:  Denies  fever, sweats, chills weigh loss  HEENT: Denies blurred vision, double vision, ear pain, eye pain, hearing loss, nose bleeds, sore throat Cardiac:  No dizziness, chest pain or heaviness, chest tightness,edema Resp:   reports dyspnea chronically  Gi: Denies swallowing difficulty, stomach pain, nausea or vomiting, diarrhea, constipation, bowel incontinence Gu:  Denies bladder incontinence, burning urine Ext:   Denies Joint pain, stiffness or swelling Skin: Denies  skin rash, easy bruising or bleeding or hives Endoc:  Denies polyuria, polydipsia , polyphagia or weight change Psych:   Denies depression, insomnia or hallucinations   Other:  All other systems negative   VS: BP 125/61 (BP Location: Right Arm)   Pulse 74   Temp 98.1 F (36.7 C) (Oral)   Resp 18   Ht 5' 10 (1.778 m)   Wt 80.2 kg   SpO2 96%   BMI 25.37 kg/m      PHYSICAL EXAM     GENERAL:NAD, no fevers, chills, no weakness no fatigue HEAD: Normocephalic, atraumatic.  EYES: Pupils equal, round, reactive to light. Extraocular muscles intact. No scleral icterus.  MOUTH: Moist mucosal membrane. Dentition intact. No abscess noted.  EAR, NOSE, THROAT: Clear without exudates. No external lesions.  NECK: Supple. No thyromegaly. No nodules. No JVD.  PULMONARY: decreased breath sounds with mild rhonchi worse at bases bilaterally.  CARDIOVASCULAR: S1 and S2. Regular rate and rhythm. No murmurs, rubs, or gallops. No edema. Pedal pulses 2+ bilaterally.  GASTROINTESTINAL: Soft, nontender, nondistended. No masses. Positive bowel sounds. No hepatosplenomegaly.  MUSCULOSKELETAL: No swelling, clubbing, or  edema. Range of motion full in all extremities.  NEUROLOGIC: Cranial nerves II through XII are intact. No gross focal neurological deficits. Sensation intact. Reflexes intact.  SKIN: No ulceration, lesions, rashes, or cyanosis. Skin warm and dry. Turgor intact.  PSYCHIATRIC: Mood, affect within normal limits. The patient is awake, alert and oriented x 3. Insight, judgment intact.       IMAGING   Narrative & Impression  CLINICAL DATA:  Hypoxia   EXAM: CHEST - 2 VIEW   COMPARISON:  Chest radiograph dated 01/08/2024   FINDINGS: Low lung volumes with bronchovascular crowding. Increased asymmetric hazy right lung opacities. Left basilar linear opacity. Similar loculated right pleural effusion. Blunting of the left costophrenic angle. No pneumothorax. The heart size and mediastinal contours are within normal limits. No acute osseous abnormality.   IMPRESSION: 1. Increased asymmetric hazy right lung opacities, likely atelectasis. 2. Similar loculated right pleural effusion. 3. Blunting of the left costophrenic angle may represent trace pleural effusion. Left basilar atelectasis.     Electronically Signed   By: Limin  Xu M.D.   On: 01/11/2024 16:52     Narrative &  Impression  EXAM: CT CHEST WITH CONTRAST 01/12/2024 06:38:26 PM   TECHNIQUE: CT of the chest was performed with the administration of intravenous contrast. Multiplanar reformatted images are provided for review. Automated exposure control, iterative reconstruction, and/or weight based adjustment of the mA/kV was utilized to reduce the radiation dose to as low as reasonably achievable.   COMPARISON: CTA chest dated 01/07/2024.   CLINICAL HISTORY: Pleural effusion, malignancy suspected.   FINDINGS:   MEDIASTINUM: Heart is normal in size. No pericardial effusion.   LYMPH NODES: Small mediastinal nodes, including a dominant 9 mm short axis right perihilar node (image 93), indeterminate.   LUNGS AND PLEURA: Moderate right pleural effusion, partially loculated, with pleural thickening. Associated postprocedural gas related to recent thoracentesis. Multifocal patchy right lung opacities, favoring atelectasis, although underlying tumor in the right perihilar region measuring at least 6.8 cm is favored (image 108). Small left pleural effusion, mildly progressive. Mild right apical pleural parenchymal scarring. No pneumothorax.   SOFT TISSUES/BONES: No acute abnormality of the bones or soft tissues.   UPPER ABDOMEN: Limited images of the upper abdomen demonstrates no acute abnormality.   IMPRESSION: 1. Suspected 6.8 cm right perihilar mass, obscured by multifocal atelectasis and effusion, but suspicious for primary bronchogenic carcinoma. 2. Moderate right pleural effusion, partially loculated. Small left pleural effusion, mildly progressive. No pneumothorax status post thoracentesis. 3. Small mediastinal nodes, indeterminate.   Electronically signed by: Pinkie Pebbles MD 01/12/2024 07:54 PM EDT RP Workstation: [id number]    ASSESSMENT/PLAN   Right parapneumonic effusion   -s/p thoracentesis - 1L amber colored fluid removed  -neutrophil predominant exudate profile  consistent with possible pneumonia -may need tpa/dornase intrapleural instillation, currently patient has negative cultures -cytology negative for malignancy but positive for acute inflammation  - due to re-accumuation post thoracentesis, there is some concern for either rapidly re-accumulating effusion secondary to pneumonia vs development of hemothorax post procedure.  - will order repeat tap and may need pigtail drainage    6.8 cm right perihilar mass   - will treat per abscess for now and evaluate in clinic for confirmation of resolution vs cancer workup and therapy if needed.     Thank you for allowing me to participate in the care of this patient.   Patient/Family are satisfied with care plan and all questions have been answered.    Provider disclosure: Patient  with at least one acute or chronic illness or injury that poses a threat to life or bodily function and is being managed actively during this encounter.  All of the below services have been performed independently by signing provider:  review of prior documentation from internal and or external health records.  Review of previous and current lab results.  Interview and comprehensive assessment during patient visit today. Review of current and previous chest radiographs/CT scans. Discussion of management and test interpretation with health care team and patient/family.   This document was prepared using Dragon voice recognition software and may include unintentional dictation errors.     Antolin Belsito, M.D.  Division of Pulmonary & Critical Care Medicine

## 2024-01-13 NOTE — Plan of Care (Incomplete)
 Pt A&OX4, D/c to home , AVS reviewed follow up questions answered, IV's removed, belongings returned. BP 125/61 (BP Location: Right Arm)   Pulse 74   Temp 98.1 F (36.7 C) (Oral)   Resp 18   Ht 5' 10 (1.778 m)   Wt 80.2 kg   SpO2 96%   BMI 25.37 kg/m  Ronald Haney 01/13/24

## 2024-01-13 NOTE — Discharge Summary (Addendum)
 Physician Discharge Summary   Patient: Ronald Haney MRN: [id number] DOB: [birth year]  Admit date:     01/07/2024  Discharge date: 01/13/24  Discharge Physician: Delon Herald   PCP: Sadie Manna, MD   Recommendations at discharge:   Complete antibiotics (Augmentin  twice daily for 2 weeks) You are being discharged home with home physical and occupational therapy Hold lisinopril  for now Follow up with Dr. Sadie in 1-2 weeks Follow up with Dr. Parris in 2 weeks  Discharge Diagnoses: Principal Problem:   Community acquired pneumonia of right lower lobe of lung Active Problems:   HTN (hypertension)   HLD (hyperlipidemia)   Empyema (HCC)   BPH (benign prostatic hyperplasia)    Hospital Course: 89yo with h/o HTN, HLD, CVA on Plavix , and stage 2 CKD who presented on 7/13 with SOB and fever.  He was found to have RLL PNA and moderate R-sided pleural effusion now s/p thoracentesis.  He was eventually able to wean off O2.  Repeat CXR with recurrent effusion.  Attempted thoracentesis was unsuccessful.  Pulmonology was consulted.  Repeat CT with concern for mass, but this could still be infectious in nature. Dr. Parris recommends 2 weeks of Augmentin  and will follow up in clinic.  Assessment and Plan:  Sepsis due to RLL PNA with acute hypoxic respiratory failure Met criteria w/ leukocytosis, tachypnea, & secondary to right sided pneumonia O2 sats as low as 86%, improved with [place] O2; will wean as tolerated Continue on IV rocephin , azithromycin  Blood cultures NTD Bronchodilators ordered Continue IS and flutter valve  Still uptrending WBC count so repeated 2-view CXR on 7/17 which showed apparent improvement in PNA but recurrent effusion WBC count has finally peaked and is downtrending CT concern for malignancy vs. Abscess of R lung Dr. Parris recommends dc to home with 2 weeks of augmentin  875/125 bid Will need outpatient follow up with pulmonlogy   Right pleural effusion  vs. possible right sided empyema S/p right thoracentesis w/ 1L of dark amber fluid removed on 01/08/24 Pleural fluid cultures NTD  Cytology of pleural fluid with acute inflammation but negative for malignancy Fungus culture is pending Repeat CXR with recurrent effusion IR thoracentesis ordered but the pocket was not accessible to drainage Dr. Hughes recommended CT chest with contrast  CT unfortunately appears to show 6.8 cm R perihilar mass, obscured by multifocal atelectasis and effusion, suspicious for primary bronchogenic carcinoma with partially loculated moderate R pleural effusion and small mediastinal nodes Will need outpatient f/u after antibiotic treatment   Elevated troponin Likely secondary to demand ischemia (no MI) Echo on 7/14 with hyperdynamic EF, grade 1 DD Continue on statin, plavix , Lasix   HLD Continue atorvastatin , gemfibrozil   BPH Continue finasteride   Hip pain Continue Tylenol , Robaxin  as needed   Hyponatremia 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 Hypovolemic in the setting of acute infection Improved and stable, not clinically relevant   CKD II Elevated creatinine on presentation, consistent with AKI in the setting of hypovolemia/infection Resolved with IVF Appears to be back to baseline at this time Attempt to avoid nephrotoxic medications   HTN Holding home amlodipine , lisinopril  as BP has been WNL  Resumed amlodipine  on 7/17 Continue to hold lisinopril    Anemia of chronic disease Appears to be at/near baseline          Consultants: Pulmonology IR SLP PT OT TOC team   Procedures: Thoracentesis 7/14 Echocardiogram 7/14   Antibiotics: Azithromycin  7/13-18 Cefepime  x 1 Ceftriaxone  7/14-19 Vancomycin  x 1     Pain control -  [place]  Controlled Substance Reporting System database was reviewed. and patient was instructed, not to drive, operate heavy machinery, perform activities at heights, swimming or participation in water activities or  provide baby-sitting services while on Pain, Sleep and Anxiety Medications; until their outpatient Physician has advised to do so again. Also recommended to not to take more than prescribed Pain, Sleep and Anxiety Medications.   Disposition: Home Diet recommendation:  Regular diet   DISCHARGE MEDICATION: Allergies as of 01/13/2024   No Known Allergies      Medication List     STOP taking these medications    methylPREDNISolone 4 MG Tbpk tablet Commonly known as: MEDROL DOSEPAK   moxifloxacin 400 MG tablet Commonly known as: AVELOX   predniSONE 10 MG tablet Commonly known as: DELTASONE       TAKE these medications    acetaminophen  500 MG tablet Commonly known as: TYLENOL  Take 500-1,000 mg by mouth every 6 (six) hours as needed (pain).   amLODipine  10 MG tablet Commonly known as: NORVASC  Take 10 mg by mouth daily.   amoxicillin -clavulanate 875-125 MG tablet Commonly known as: AUGMENTIN  Take 1 tablet by mouth 2 (two) times daily for 10 days.   atorvastatin  40 MG tablet Commonly known as: Lipitor Take 1 tablet (40 mg total) by mouth daily. What changed: when to take this   clopidogrel  75 MG tablet Commonly known as: PLAVIX  Take 1 tablet (75 mg total) by mouth daily. What changed: when to take this   cyanocobalamin  1000 MCG tablet Commonly known as: VITAMIN B12 Take 1,000 mcg by mouth.  Take 1 tablet (1,000 mcg total) by mouth once daily   docusate sodium  100 MG capsule Commonly known as: COLACE Take 1 capsule (100 mg total) by mouth 2 (two) times daily.   finasteride  5 MG tablet Commonly known as: PROSCAR  Take 1 tablet (5 mg total) by mouth daily.   fluocinolone 0.01 % external solution Commonly known as: SYNALAR Apply topically.  APPLY SMALL AMOUNT TOPICALLY TWO TIMES A DAY USE FOR ITCHING IN AND ON EARS   furosemide  20 MG tablet Commonly known as: LASIX  Take 20 mg by mouth daily.   gemfibrozil  600 MG tablet Commonly known as: LOPID  Take 600 mg  by mouth 2 (two) times daily.   Gerhardt's butt cream Crea Apply 1 Application topically daily. Start taking on: January 14, 2024   guaiFENesin  600 MG 12 hr tablet Commonly known as: MUCINEX  Take 2 tablets (1,200 mg total) by mouth 2 (two) times daily.   lisinopril  40 MG tablet Commonly known as: ZESTRIL  Take 40 mg by mouth in the morning.   methocarbamol  500 MG tablet Commonly known as: ROBAXIN  Take 500 mg by mouth.   olopatadine  0.1 % ophthalmic solution Commonly known as: PATANOL Apply 1 drop to eye. Place 1 drop into both eyes 2 (two) times daily   omeprazole 20 MG capsule Commonly known as: PRILOSEC Take 20 mg by mouth daily before breakfast.   salicyclic acid-sulfur 2-2 % shampoo Commonly known as: SEBULEX SHAMPOO CAPFUL(S) TOPICALLY MONDAY,WEDNESDAY,FRIDAY   Salicylic Acid 40 % Misc SOAK AS DIRECTED THE AFFECTED AREA IN WARM WATER FOR 5 MINUTES, DRY, THEN APPLY PLASTER. REMOVE AFTER 48 HOURS, REPEAT. USE FOR 14 DAYS ONLY        Discharge Exam:    Subjective: Feeling better, sitting up in bedside chair, fully dressed, eager to go home. Thinks he stretched his L hip with movement, having some pain that is improved with Tylenol .   Objective:  Vitals:   01/13/24 0425 01/13/24 0805  BP: (!) 152/61 125/61  Pulse: 89 74  Resp: 19 18  Temp: 98.6 F (37 C) 98.1 F (36.7 C)  SpO2: 94% 96%   No intake or output data in the 24 hours ending 01/13/24 1028 Filed Weights   01/07/24 1342  Weight: 80.2 kg    Exam:  General:  Appears calm and comfortable and is in NAD, on RA  Eyes:   normal lids, iris ENT:  grossly normal hearing, lips & tongue, mmm Cardiovascular:  RRR. No LE edema.  Respiratory:   RLL with diminished breath sounds.  Normal respiratory effort. Abdomen:  soft, NT, ND Skin:  no rash or induration seen on limited exam Musculoskeletal:  grossly normal tone BUE/BLE, good ROM, no bony abnormality Psychiatric:  grossly normal mood and affect, speech  fluent and appropriate, AOx3 Neurologic:  CN 2-12 grossly intact, moves all extremities in coordinated fashion  Data Reviewed: I have reviewed the patient's lab results since admission.  Pertinent labs for today include:   Stable BMP Procalcitonin 0.45 WBC 21.1 Hgb 9.4    Condition at discharge: improving  The results of significant diagnostics from this hospitalization (including imaging, microbiology, ancillary and laboratory) are listed below for reference.   Imaging Studies: CT CHEST W CONTRAST Result Date: 01/12/2024 EXAM: CT CHEST WITH CONTRAST 01/12/2024 06:38:26 PM TECHNIQUE: CT of the chest was performed with the administration of intravenous contrast. Multiplanar reformatted images are provided for review. Automated exposure control, iterative reconstruction, and/or weight based adjustment of the mA/kV was utilized to reduce the radiation dose to as low as reasonably achievable. COMPARISON: CTA chest dated 01/07/2024. CLINICAL HISTORY: Pleural effusion, malignancy suspected. FINDINGS: MEDIASTINUM: Heart is normal in size. No pericardial effusion. LYMPH NODES: Small mediastinal nodes, including a dominant 9 mm short axis right perihilar node (image 93), indeterminate. LUNGS AND PLEURA: Moderate right pleural effusion, partially loculated, with pleural thickening. Associated postprocedural gas related to recent thoracentesis. Multifocal patchy right lung opacities, favoring atelectasis, although underlying tumor in the right perihilar region measuring at least 6.8 cm is favored (image 108). Small left pleural effusion, mildly progressive. Mild right apical pleural parenchymal scarring. No pneumothorax. SOFT TISSUES/BONES: No acute abnormality of the bones or soft tissues. UPPER ABDOMEN: Limited images of the upper abdomen demonstrates no acute abnormality. IMPRESSION: 1. Suspected 6.8 cm right perihilar mass, obscured by multifocal atelectasis and effusion, but suspicious for primary  bronchogenic carcinoma. 2. Moderate right pleural effusion, partially loculated. Small left pleural effusion, mildly progressive. No pneumothorax status post thoracentesis. 3. Small mediastinal nodes, indeterminate. Electronically signed by: Pinkie Pebbles MD 01/12/2024 07:54 PM EDT RP Workstation: [id number]   US  THORACENTESIS ASP PLEURAL SPACE W/IMG GUIDE Result Date: 01/12/2024 INDICATION: [id number] Pleural effusion on right 6471 89 year old male with a history of CHF, recent diagnosis of pneumonia, with recurrent right-sided pleural effusion. IR was requested for diagnostic and therapeutic thoracentesis. EXAM: ULTRASOUND GUIDED RIGHT DIAGNOSTIC AND THERAPEUTIC THORACENTESIS MEDICATIONS: 6 cc of 1% lidocaine  COMPLICATIONS: None immediate. PROCEDURE: An ultrasound guided thoracentesis was thoroughly discussed with the patient and questions answered. The benefits, risks, alternatives and complications were also discussed. The patient understands and wishes to proceed with the procedure. Written consent was obtained. Ultrasound was performed to localize and mark an adequate pocket of fluid in the right chest. The area was then prepped and draped in the normal sterile fashion. 1% Lidocaine  was used for local anesthesia. Under ultrasound guidance a 6 Fr Safe-T-Centesis catheter was introduced. Thoracentesis was  performed. The catheter was removed and a dressing applied. FINDINGS: A total of approximately 0 mL of fluid was removed. IMPRESSION: Aborted ultrasound guided RIGHT thoracentesis. No fluid could be drained. Procedure performed by Carlin Griffon, PA-C Electronically Signed   By: Thom Hall M.D.   On: 01/12/2024 16:49   DG Chest Port 1 View Result Date: 01/12/2024 CLINICAL DATA:  Right-sided pleural effusion. Status post thoracentesis. EXAM: PORTABLE CHEST 1 VIEW COMPARISON:  X-ray 01/11/2024.  CT 01/07/2024.  Older exams as well. FINDINGS: Moderate right and small left pleural effusion identified. Once  again there is some loculated pleural fluid at the right lung base. Adjacent lung opacities. Normal cardiopericardial silhouette. Calcified aorta. No pneumothorax identified this time. IMPRESSION: No pneumothorax seen post thoracentesis. Electronically Signed   By: Ranell Bring M.D.   On: 01/12/2024 16:06   DG Chest 2 View Result Date: 01/11/2024 CLINICAL DATA:  Hypoxia EXAM: CHEST - 2 VIEW COMPARISON:  Chest radiograph dated 01/08/2024 FINDINGS: Low lung volumes with bronchovascular crowding. Increased asymmetric hazy right lung opacities. Left basilar linear opacity. Similar loculated right pleural effusion. Blunting of the left costophrenic angle. No pneumothorax. The heart size and mediastinal contours are within normal limits. No acute osseous abnormality. IMPRESSION: 1. Increased asymmetric hazy right lung opacities, likely atelectasis. 2. Similar loculated right pleural effusion. 3. Blunting of the left costophrenic angle may represent trace pleural effusion. Left basilar atelectasis. Electronically Signed   By: Limin  Xu M.D.   On: 01/11/2024 16:52   ECHOCARDIOGRAM COMPLETE Result Date: 01/09/2024    ECHOCARDIOGRAM REPORT   Patient Name:   AZUL BRUMETT Date of Exam: 01/08/2024 Medical Rec #:  [id number]     Height:       70.0 in Accession #:    [id number]    Weight:       176.8 lb Date of Birth:  [birth year]     BSA:          1.981 m Patient Age:    89 years      BP:           142/63 mmHg Patient Gender: M             HR:           91 bpm. Exam Location:  [facility] Procedure: 2D Echo, Cardiac Doppler and Color Doppler (Both Spectral and Color            Flow Doppler were utilized during procedure). Indications:     Elevated troponin  History:         Patient has prior history of Echocardiogram examinations, most                  recent 05/02/2018. Stroke; Risk Factors:Dyslipidemia and                  Hypertension.  Sonographer:     Christopher Furnace Referring Phys:  [id number] CORT ONEIDA MANA Diagnosing Phys: Cara JONETTA Lovelace MD IMPRESSIONS  1. Left ventricular ejection fraction, by estimation, is 70 to 75%. The left ventricle has hyperdynamic function. The left ventricle has no regional wall motion abnormalities. Left ventricular diastolic parameters are consistent with Grade I diastolic dysfunction (impaired relaxation).  2. Right ventricular systolic function is normal. The right ventricular size is normal.  3. The mitral valve is normal in structure. Mild mitral valve regurgitation.  4. The aortic valve is normal in structure. Aortic valve regurgitation is mild. FINDINGS  Left Ventricle: Left ventricular  ejection fraction, by estimation, is 70 to 75%. The left ventricle has hyperdynamic function. The left ventricle has no regional wall motion abnormalities. Strain was performed and the global longitudinal strain is indeterminate. The left ventricular internal cavity size was normal in size. There is no left ventricular hypertrophy. Left ventricular diastolic parameters are consistent with Grade I diastolic dysfunction (impaired relaxation). Right Ventricle: The right ventricular size is normal. No increase in right ventricular wall thickness. Right ventricular systolic function is normal. Left Atrium: Left atrial size was normal in size. Right Atrium: Right atrial size was normal in size. Pericardium: There is no evidence of pericardial effusion. Mitral Valve: The mitral valve is normal in structure. Mild mitral valve regurgitation. MV peak gradient, 8.9 mmHg. The mean mitral valve gradient is 5.0 mmHg. Tricuspid Valve: The tricuspid valve is normal in structure. Tricuspid valve regurgitation is mild. Aortic Valve: The aortic valve is normal in structure. Aortic valve regurgitation is mild. Aortic valve mean gradient measures 4.0 mmHg. Aortic valve peak gradient measures 6.9 mmHg. Aortic valve area, by VTI measures 4.14 cm. Pulmonic Valve: The pulmonic valve was normal in structure. Pulmonic valve regurgitation is not  visualized. Aorta: The ascending aorta was not well visualized. IAS/Shunts: No atrial level shunt detected by color flow Doppler. Additional Comments: 3D was performed not requiring image post processing on an independent workstation and was indeterminate.  LEFT VENTRICLE PLAX 2D LVIDd:         4.70 cm   Diastology LVIDs:         2.70 cm   LV e' medial:    8.05 cm/s LV PW:         0.90 cm   LV E/e' medial:  9.9 LV IVS:        1.30 cm   LV e' lateral:   7.72 cm/s LVOT diam:     2.20 cm   LV E/e' lateral: 10.3 LV SV:         97 LV SV Index:   49 LVOT Area:     3.80 cm  RIGHT VENTRICLE RV Basal diam:  3.90 cm RV Mid diam:    3.50 cm RV S prime:     14.60 cm/s TAPSE (M-mode): 2.5 cm LEFT ATRIUM             Index        RIGHT ATRIUM           Index LA diam:        4.50 cm 2.27 cm/m   RA Area:     13.10 cm LA Vol (A2C):   45.8 ml 23.12 ml/m  RA Volume:   32.20 ml  16.26 ml/m LA Vol (A4C):   31.6 ml 15.95 ml/m LA Biplane Vol: 39.1 ml 19.74 ml/m  AORTIC VALVE AV Area (Vmax):    3.93 cm AV Area (Vmean):   3.72 cm AV Area (VTI):     4.14 cm AV Vmax:           131.50 cm/s AV Vmean:          92.400 cm/s AV VTI:            0.234 m AV Peak Grad:      6.9 mmHg AV Mean Grad:      4.0 mmHg LVOT Vmax:         136.00 cm/s LVOT Vmean:        90.500 cm/s LVOT VTI:  0.255 m LVOT/AV VTI ratio: 1.09  AORTA Ao Root diam: 3.60 cm MITRAL VALVE                TRICUSPID VALVE MV Area (PHT): 4.63 cm     TR Peak grad:   13.1 mmHg MV Area VTI:   3.81 cm     TR Vmax:        181.00 cm/s MV Peak grad:  8.9 mmHg MV Mean grad:  5.0 mmHg     SHUNTS MV Vmax:       1.49 m/s     Systemic VTI:  0.26 m MV Vmean:      102.0 cm/s   Systemic Diam: 2.20 cm MV Decel Time: 164 msec MV E velocity: 79.70 cm/s MV A velocity: 141.00 cm/s MV E/A ratio:  0.57 Dwayne JONETTA Lovelace MD Electronically signed by Cara JONETTA Lovelace MD Signature Date/Time: 01/09/2024/2:44:02 PM    Final    US  THORACENTESIS ASP PLEURAL SPACE W/IMG GUIDE Result Date:  01/08/2024 INDICATION: [id number] Pleural effusion on right 1945 89 year old male with a history of CHF and a recent diagnosis of pneumonia. He presented to the ED with worsening shortness of breath and right-sided chest and abdominal pain. Found to have a partially loculated right-sided pleural effusion. Request for diagnostic and therapeutic thoracentesis. EXAM: ULTRASOUND GUIDED RIGHT THORACENTESIS MEDICATIONS: 1% lidocaine , 8 mL. COMPLICATIONS: None immediate. PROCEDURE: An ultrasound guided thoracentesis was thoroughly discussed with the patient and questions answered. The benefits, risks, alternatives and complications were also discussed. The patient understands and wishes to proceed with the procedure. Written consent was obtained. Ultrasound was performed to localize and mark an adequate pocket of fluid in the RIGHT chest. The area was then prepped and draped in the normal sterile fashion. 1% Lidocaine  was used for local anesthesia. Under ultrasound guidance a 6 Fr Safe-T-Centesis catheter was introduced. Thoracentesis was performed. The catheter was removed and a dressing applied. FINDINGS: A total of approximately 1 L of dark amber fluid was removed. Samples were sent to the laboratory as requested by the clinical team. IMPRESSION: Successful ultrasound guided diagnostic and therapeutic RIGHT thoracentesis yielding 1 L of pleural fluid. Procedure performed by: Sherrilee Bal, PA-C under the supervision of Thom Hall, MD Electronically Signed   By: Thom Hall M.D.   On: 01/08/2024 15:32   DG Chest Port 1 View Result Date: 01/08/2024 CLINICAL DATA:  Pleural effusion on right. EXAM: PORTABLE CHEST 1 VIEW COMPARISON:  CT 01/07/2024.  Radiographs 04/30/2018. FINDINGS: 1245 hours. Right pleural effusion has decreased in volume compared with the recent CT. There is mild extension of fluid into the fissures. Mild atelectasis at both lung bases with interval improved aeration of the right lung base. No  evidence of pneumothorax. The heart size and mediastinal contours are stable with mild aortic atherosclerosis. IMPRESSION: Decreased volume of right pleural effusion with improved aeration of the right lung base. No evidence of pneumothorax. Electronically Signed   By: Elsie Perone M.D.   On: 01/08/2024 13:10   CT Angio Chest PE W and/or Wo Contrast Result Date: 01/07/2024 CLINICAL DATA:  The EXAM: CT ANGIOGRAPHY CHEST WITH CONTRAST TECHNIQUE: Multidetector CT imaging of the chest was performed using the standard protocol during bolus administration of intravenous contrast. Multiplanar CT image reconstructions and MIPs were obtained to evaluate the vascular anatomy. RADIATION DOSE REDUCTION: This exam was performed according to the departmental dose-optimization program which includes automated exposure control, adjustment of the mA and/or kV according to patient size and/or  use of iterative reconstruction technique. CONTRAST:  60mL OMNIPAQUE  IOHEXOL  350 MG/ML SOLN COMPARISON:  Chest CT 03/26/2015. FINDINGS: Cardiovascular: Satisfactory opacification of the pulmonary arteries to the segmental level. No evidence of pulmonary embolism. Normal heart size. No pericardial effusion. There are atherosclerotic calcifications of the aorta and coronary arteries. Mediastinum/Nodes: No enlarged mediastinal, hilar, or axillary lymph nodes. Thyroid  gland is within normal limits. There is mild fluid distention of the esophagus. There is a moderate size hiatal hernia containing the proximal stomach. Lungs/Pleura: The skin there is a stable 3 mm right upper lobe nodule image 5/70. Trachea and central airways are patent. Upper Abdomen: Cholecystectomy clips are present. Musculoskeletal: No chest wall abnormality. No acute or significant osseous findings. Review of the MIP images confirms the above findings. IMPRESSION: 1. No evidence for pulmonary embolism. 2. Moderate-sized right pleural effusion, partially loculated in the  upper hemithorax and major fissure. 3. Trace left pleural effusion. 4. Airspace disease and consolidation in the right middle lobe and right lower lobe worrisome for pneumonia. 5. Moderate size hiatal hernia containing the proximal stomach. Mild fluid distention of the esophagus. 6. Aortic atherosclerosis. Aortic Atherosclerosis (ICD10-I70.0).  A Electronically Signed   By: Greig Pique M.D.   On: 01/07/2024 16:57   CT ABDOMEN PELVIS W CONTRAST Result Date: 01/07/2024 CLINICAL DATA:  Acute abdominal pain.  Right-sided pain. EXAM: CT ABDOMEN AND PELVIS WITH CONTRAST TECHNIQUE: Multidetector CT imaging of the abdomen and pelvis was performed using the standard protocol following bolus administration of intravenous contrast. RADIATION DOSE REDUCTION: This exam was performed according to the departmental dose-optimization program which includes automated exposure control, adjustment of the mA and/or kV according to patient size and/or use of iterative reconstruction technique. CONTRAST:  60mL OMNIPAQUE  IOHEXOL  350 MG/ML SOLN COMPARISON:  Concurrent chest CTA, reported separately. CT 01/06/2021 FINDINGS: Lower chest: Right pleural effusion and opacity, Foley assessed on concurrent chest CT. Hepatobiliary: Punctate calcification in the right hepatic dome. No suspicious liver lesion. Gallbladder physiologically distended, no calcified stone. No biliary dilatation. Pancreas: Fatty atrophy.  No ductal dilatation or inflammation. Spleen: Normal in size without focal abnormality. Adrenals/Urinary Tract: Normal adrenal glands. No hydronephrosis or renal calculi. Symmetric bilateral perinephric edema. Small low-density lesions in both kidneys are too small to characterize, likely small cysts. No further follow-up imaging is recommended. No evidence of suspicious lesion. Symmetric renal excretion on delayed phase imaging. Unremarkable urinary bladder. Stomach/Bowel: Moderate hiatal hernia. Fluid in the distal esophagus. No  small bowel obstruction or inflammatory change. Small to moderate volume of formed stool in the colon. The appendix is normal. Vascular/Lymphatic: Aortic and branch atherosclerosis. No aortic aneurysm. The portal vein is patent. No adenopathy. Reproductive: Enlarged prostate spans 5.7 cm. Other: No free air or ascites. Diminutive fat containing umbilical hernia. Small fat containing left inguinal hernia. Mild generalized body wall edema. Tiny sebaceous cyst in the right gluteal soft tissues. Musculoskeletal: There are no acute or suspicious osseous abnormalities. IMPRESSION: 1. Symmetric perinephric fat stranding is nonspecific, typically chronic. Recommend correlation with urinalysis to assess for urinary tract infection 2. Moderate hiatal hernia with fluid in the distal esophagus, can be seen with reflux. 3. Enlarged prostate. Aortic Atherosclerosis (ICD10-I70.0). Electronically Signed   By: Andrea Gasman M.D.   On: 01/07/2024 16:50   US  Venous Img Lower Bilateral Result Date: 01/07/2024 CLINICAL DATA:  leg swelling EXAM: BILATERAL LOWER EXTREMITY VENOUS DOPPLER ULTRASOUND TECHNIQUE: Gray-scale sonography with compression, as well as color and duplex ultrasound, were performed to evaluate the deep venous system(s)  from the level of the common femoral vein through the popliteal and proximal calf veins. COMPARISON:  None Available. FINDINGS: VENOUS Normal compressibility of the common femoral, superficial femoral, and popliteal veins, as well as the visualized calf veins. Visualized portions of profunda femoral vein and great saphenous vein unremarkable. No filling defects to suggest DVT on grayscale or color Doppler imaging. Doppler waveforms show normal direction of venous flow, normal respiratory plasticity and response to augmentation. OTHER Superficial soft tissue edema in the LEFT calf subcutaneous tissues. Limitations: none IMPRESSION: Negative. Electronically Signed   By: Corean Salter M.D.   On:  01/07/2024 16:22    Microbiology: Results for orders placed or performed during the hospital encounter of 01/07/24  Blood culture (routine x 2)     Status: None   Collection Time: 01/07/24  1:41 PM   Specimen: BLOOD  Result Value Ref Range Status   Specimen Description BLOOD BLOOD RIGHT FOREARM  Final   Special Requests   Final    BOTTLES DRAWN AEROBIC AND ANAEROBIC Blood Culture adequate volume   Culture   Final    NO GROWTH 5 DAYS Performed at [facility], [address], [place], [place] [address]    Report Status 01/12/2024 FINAL  Final  Blood culture (routine x 2)     Status: None   Collection Time: 01/07/24  1:41 PM   Specimen: BLOOD  Result Value Ref Range Status   Specimen Description BLOOD BLOOD LEFT WRIST  Final   Special Requests   Final    BOTTLES DRAWN AEROBIC AND ANAEROBIC Blood Culture adequate volume   Culture   Final    NO GROWTH 5 DAYS Performed at [facility], [address], [place], [place] [address]    Report Status 01/12/2024 FINAL  Final  MRSA Next Gen by PCR, Nasal     Status: None   Collection Time: 01/07/24  6:14 PM   Specimen: Nasal Mucosa; Nasal Swab  Result Value Ref Range Status   MRSA by PCR Next Gen NOT DETECTED NOT DETECTED Final    Comment: (NOTE) The GeneXpert MRSA Assay (FDA approved for NASAL specimens only), is one component of a comprehensive MRSA colonization surveillance program. It is not intended to diagnose MRSA infection nor to guide or monitor treatment for MRSA infections. Test performance is not FDA approved in patients less than 32 years old. Performed at [facility], [address] [place], [place] [address]   Fungus Culture With Stain     Status: None (Preliminary result)   Collection Time: 01/08/24 12:37 PM   Specimen: PATH Cytology Pleural fluid  Result Value Ref Range Status   Fungus Stain Final report  Final    Comment: (NOTE) Performed At: [facility] [address]  [place], [place] [id number] Jennette Shorter MD Ey:[id number]    Fungus (Mycology) Culture PENDING  Incomplete   Fungal Source FLUID  Final    Comment: Performed at [facility], [address], [place], [place] [address]  Body fluid culture w Gram Stain     Status: None   Collection Time: 01/08/24 12:37 PM   Specimen: PATH Cytology Pleural fluid  Result Value Ref Range Status   Specimen Description   Final    FLUID Performed at [facility], [address], [place], [place] [address]    Special Requests   Final     CYTO PLEU Performed at [facility], [address], [place], [place] [address]  Gram Stain   Final    FEW WBC PRESENT, PREDOMINANTLY PMN NO ORGANISMS SEEN    Culture   Final    NO GROWTH 3 DAYS Performed at [facility], [address], [place], [place] [address]    Report Status 01/12/2024 FINAL  Final  Fungus Culture Result     Status: None   Collection Time: 01/08/24 12:37 PM  Result Value Ref Range Status   Result 1 Comment  Final    Comment: (NOTE) KOH/Calcofluor preparation:  no fungus observed. Performed At: [facility] [address] [place], [place] [id number] Jennette Shorter MD Ey:[id number]     Labs: CBC: Recent Labs  Lab 01/07/24 1341 01/08/24 0558 01/09/24 0942 01/10/24 0435 01/11/24 0509 01/12/24 0521 01/13/24 0440  WBC 25.1*   < > 19.2* 22.9* 25.3* 23.4* 21.1*  NEUTROABS 20.3*  --   --   --  20.9* 18.2* 15.9*  HGB 10.7*   < > 10.7* 10.1* 9.9* 9.7* 9.4*  HCT 31.9*   < > 32.0* 29.3* 29.4* 28.6* 27.7*  MCV 85.8   < > 84.2 84.2 84.7 85.6 84.5  PLT 293   < > 323 298 288 275 275   < > = values in this interval not displayed.   Basic Metabolic Panel: Recent Labs  Lab 01/09/24 0009 01/09/24 0942 01/10/24 0435 01/11/24 0509 01/12/24 0521 01/13/24 0440  NA  --  134* 134* 134* 134* 136  K  --  3.8 3.5 3.5 3.5 3.5  CL  --  104 105 106 105 106  CO2  --  19* 17* 19* 22 21*  GLUCOSE  --  183*  128* 100* 100* 95  BUN  --  40* 40* 37* 40* 39*  CREATININE  --  1.10 1.09 1.01 1.08 1.08  CALCIUM   --  8.3* 8.2* 8.1* 8.1* 8.2*  MG 1.6*  --  2.2  --   --   --    Liver Function Tests: Recent Labs  Lab 01/07/24 1341 01/09/24 0942  AST 46* 36  ALT 33 34  ALKPHOS 71 53  BILITOT 0.5 0.7  PROT 6.9 6.3*  ALBUMIN  2.5* 2.2*   CBG: No results for input(s): GLUCAP in the last 168 hours.  Discharge time spent: greater than 30 minutes.  Signed: Delon Herald, MD [facility] 01/13/2024

## 2024-01-17 DIAGNOSIS — J851 Abscess of lung with pneumonia: Secondary | ICD-10-CM | POA: Diagnosis not present

## 2024-01-17 DIAGNOSIS — J90 Pleural effusion, not elsewhere classified: Secondary | ICD-10-CM | POA: Diagnosis not present

## 2024-01-18 ENCOUNTER — Other Ambulatory Visit: Payer: Self-pay | Admitting: Pulmonary Disease

## 2024-01-18 DIAGNOSIS — J851 Abscess of lung with pneumonia: Secondary | ICD-10-CM

## 2024-01-18 DIAGNOSIS — J90 Pleural effusion, not elsewhere classified: Secondary | ICD-10-CM

## 2024-01-19 ENCOUNTER — Ambulatory Visit
Admission: RE | Admit: 2024-01-19 | Discharge: 2024-01-19 | Disposition: A | Discharge status: Home / Self Care | Source: Ambulatory Visit | Attending: Pulmonary Disease | Admitting: Pulmonary Disease

## 2024-01-19 ENCOUNTER — Other Ambulatory Visit: Payer: Self-pay | Admitting: Pulmonary Disease

## 2024-01-19 DIAGNOSIS — J90 Pleural effusion, not elsewhere classified: Secondary | ICD-10-CM | POA: Diagnosis not present

## 2024-01-19 DIAGNOSIS — J851 Abscess of lung with pneumonia: Secondary | ICD-10-CM

## 2024-01-19 NOTE — Progress Notes (Signed)
  IR BRIEF NOTE:  I was present during limited ultrasound of the right chest prior to possible thoracentesis.   Right pleural effusion was small and loculated. There was no significant pocket of fluid to allow for safe approach for thoracentesis.    Electronically Signed: Carlin DELENA Griffon, PA-C 01/19/2024, 10:41 AM

## 2024-01-23 DIAGNOSIS — R918 Other nonspecific abnormal finding of lung field: Secondary | ICD-10-CM | POA: Diagnosis not present

## 2024-01-23 DIAGNOSIS — Z79899 Other long term (current) drug therapy: Secondary | ICD-10-CM | POA: Diagnosis not present

## 2024-01-23 DIAGNOSIS — I83899 Varicose veins of unspecified lower extremities with other complications: Secondary | ICD-10-CM | POA: Diagnosis not present

## 2024-01-23 DIAGNOSIS — J90 Pleural effusion, not elsewhere classified: Secondary | ICD-10-CM | POA: Diagnosis not present

## 2024-01-23 DIAGNOSIS — D649 Anemia, unspecified: Secondary | ICD-10-CM | POA: Diagnosis not present

## 2024-01-23 DIAGNOSIS — Z8673 Personal history of transient ischemic attack (TIA), and cerebral infarction without residual deficits: Secondary | ICD-10-CM | POA: Diagnosis not present

## 2024-01-23 DIAGNOSIS — Z09 Encounter for follow-up examination after completed treatment for conditions other than malignant neoplasm: Secondary | ICD-10-CM | POA: Diagnosis not present

## 2024-01-23 DIAGNOSIS — R531 Weakness: Secondary | ICD-10-CM | POA: Diagnosis not present

## 2024-01-23 DIAGNOSIS — R600 Localized edema: Secondary | ICD-10-CM | POA: Diagnosis not present

## 2024-01-25 DIAGNOSIS — J9601 Acute respiratory failure with hypoxia: Secondary | ICD-10-CM | POA: Diagnosis not present

## 2024-01-25 DIAGNOSIS — K219 Gastro-esophageal reflux disease without esophagitis: Secondary | ICD-10-CM | POA: Diagnosis not present

## 2024-01-25 DIAGNOSIS — A419 Sepsis, unspecified organism: Secondary | ICD-10-CM | POA: Diagnosis not present

## 2024-01-25 DIAGNOSIS — R918 Other nonspecific abnormal finding of lung field: Secondary | ICD-10-CM | POA: Diagnosis not present

## 2024-01-25 DIAGNOSIS — J90 Pleural effusion, not elsewhere classified: Secondary | ICD-10-CM | POA: Diagnosis not present

## 2024-01-25 DIAGNOSIS — I129 Hypertensive chronic kidney disease with stage 1 through stage 4 chronic kidney disease, or unspecified chronic kidney disease: Secondary | ICD-10-CM | POA: Diagnosis not present

## 2024-01-25 DIAGNOSIS — K222 Esophageal obstruction: Secondary | ICD-10-CM | POA: Diagnosis not present

## 2024-01-25 DIAGNOSIS — J188 Other pneumonia, unspecified organism: Secondary | ICD-10-CM | POA: Diagnosis not present

## 2024-01-25 DIAGNOSIS — J869 Pyothorax without fistula: Secondary | ICD-10-CM | POA: Diagnosis not present

## 2024-02-01 DIAGNOSIS — R918 Other nonspecific abnormal finding of lung field: Secondary | ICD-10-CM | POA: Diagnosis not present

## 2024-02-01 DIAGNOSIS — J869 Pyothorax without fistula: Secondary | ICD-10-CM | POA: Diagnosis not present

## 2024-02-01 DIAGNOSIS — K222 Esophageal obstruction: Secondary | ICD-10-CM | POA: Diagnosis not present

## 2024-02-01 DIAGNOSIS — A419 Sepsis, unspecified organism: Secondary | ICD-10-CM | POA: Diagnosis not present

## 2024-02-01 DIAGNOSIS — J90 Pleural effusion, not elsewhere classified: Secondary | ICD-10-CM | POA: Diagnosis not present

## 2024-02-01 DIAGNOSIS — J188 Other pneumonia, unspecified organism: Secondary | ICD-10-CM | POA: Diagnosis not present

## 2024-02-01 DIAGNOSIS — J9601 Acute respiratory failure with hypoxia: Secondary | ICD-10-CM | POA: Diagnosis not present

## 2024-02-01 DIAGNOSIS — I129 Hypertensive chronic kidney disease with stage 1 through stage 4 chronic kidney disease, or unspecified chronic kidney disease: Secondary | ICD-10-CM | POA: Diagnosis not present

## 2024-02-01 DIAGNOSIS — K219 Gastro-esophageal reflux disease without esophagitis: Secondary | ICD-10-CM | POA: Diagnosis not present

## 2024-02-06 DIAGNOSIS — R0602 Shortness of breath: Secondary | ICD-10-CM | POA: Diagnosis not present

## 2024-02-06 DIAGNOSIS — J90 Pleural effusion, not elsewhere classified: Secondary | ICD-10-CM | POA: Diagnosis not present

## 2024-02-06 DIAGNOSIS — J851 Abscess of lung with pneumonia: Secondary | ICD-10-CM | POA: Diagnosis not present

## 2024-02-07 LAB — FUNGAL ORGANISM REFLEX

## 2024-02-07 LAB — FUNGUS CULTURE WITH STAIN

## 2024-02-07 LAB — FUNGUS CULTURE RESULT

## 2024-02-08 ENCOUNTER — Other Ambulatory Visit: Payer: Self-pay | Admitting: Pulmonary Disease

## 2024-02-08 DIAGNOSIS — R918 Other nonspecific abnormal finding of lung field: Secondary | ICD-10-CM | POA: Diagnosis not present

## 2024-02-08 DIAGNOSIS — J9601 Acute respiratory failure with hypoxia: Secondary | ICD-10-CM | POA: Diagnosis not present

## 2024-02-08 DIAGNOSIS — R0602 Shortness of breath: Secondary | ICD-10-CM

## 2024-02-08 DIAGNOSIS — K219 Gastro-esophageal reflux disease without esophagitis: Secondary | ICD-10-CM | POA: Diagnosis not present

## 2024-02-08 DIAGNOSIS — A419 Sepsis, unspecified organism: Secondary | ICD-10-CM | POA: Diagnosis not present

## 2024-02-08 DIAGNOSIS — J188 Other pneumonia, unspecified organism: Secondary | ICD-10-CM | POA: Diagnosis not present

## 2024-02-08 DIAGNOSIS — J90 Pleural effusion, not elsewhere classified: Secondary | ICD-10-CM | POA: Diagnosis not present

## 2024-02-08 DIAGNOSIS — J869 Pyothorax without fistula: Secondary | ICD-10-CM | POA: Diagnosis not present

## 2024-02-08 DIAGNOSIS — I129 Hypertensive chronic kidney disease with stage 1 through stage 4 chronic kidney disease, or unspecified chronic kidney disease: Secondary | ICD-10-CM | POA: Diagnosis not present

## 2024-02-08 DIAGNOSIS — J851 Abscess of lung with pneumonia: Secondary | ICD-10-CM

## 2024-02-08 DIAGNOSIS — K222 Esophageal obstruction: Secondary | ICD-10-CM | POA: Diagnosis not present

## 2024-02-20 DIAGNOSIS — J869 Pyothorax without fistula: Secondary | ICD-10-CM | POA: Diagnosis not present

## 2024-02-20 DIAGNOSIS — R918 Other nonspecific abnormal finding of lung field: Secondary | ICD-10-CM | POA: Diagnosis not present

## 2024-02-20 DIAGNOSIS — K222 Esophageal obstruction: Secondary | ICD-10-CM | POA: Diagnosis not present

## 2024-02-20 DIAGNOSIS — K219 Gastro-esophageal reflux disease without esophagitis: Secondary | ICD-10-CM | POA: Diagnosis not present

## 2024-02-20 DIAGNOSIS — A419 Sepsis, unspecified organism: Secondary | ICD-10-CM | POA: Diagnosis not present

## 2024-02-20 DIAGNOSIS — J188 Other pneumonia, unspecified organism: Secondary | ICD-10-CM | POA: Diagnosis not present

## 2024-02-20 DIAGNOSIS — I129 Hypertensive chronic kidney disease with stage 1 through stage 4 chronic kidney disease, or unspecified chronic kidney disease: Secondary | ICD-10-CM | POA: Diagnosis not present

## 2024-02-20 DIAGNOSIS — J9601 Acute respiratory failure with hypoxia: Secondary | ICD-10-CM | POA: Diagnosis not present

## 2024-02-20 DIAGNOSIS — J90 Pleural effusion, not elsewhere classified: Secondary | ICD-10-CM | POA: Diagnosis not present

## 2024-02-21 DIAGNOSIS — J869 Pyothorax without fistula: Secondary | ICD-10-CM | POA: Diagnosis not present

## 2024-02-21 DIAGNOSIS — J90 Pleural effusion, not elsewhere classified: Secondary | ICD-10-CM | POA: Diagnosis not present

## 2024-02-21 DIAGNOSIS — K222 Esophageal obstruction: Secondary | ICD-10-CM | POA: Diagnosis not present

## 2024-02-21 DIAGNOSIS — A4189 Other specified sepsis: Secondary | ICD-10-CM | POA: Diagnosis not present

## 2024-02-21 DIAGNOSIS — J188 Other pneumonia, unspecified organism: Secondary | ICD-10-CM | POA: Diagnosis not present

## 2024-02-21 DIAGNOSIS — K219 Gastro-esophageal reflux disease without esophagitis: Secondary | ICD-10-CM | POA: Diagnosis not present

## 2024-02-21 DIAGNOSIS — J9601 Acute respiratory failure with hypoxia: Secondary | ICD-10-CM | POA: Diagnosis not present

## 2024-02-21 DIAGNOSIS — R918 Other nonspecific abnormal finding of lung field: Secondary | ICD-10-CM | POA: Diagnosis not present

## 2024-02-29 ENCOUNTER — Ambulatory Visit
Admission: RE | Admit: 2024-02-29 | Discharge: 2024-02-29 | Disposition: A | Discharge status: Home / Self Care | Source: Ambulatory Visit | Attending: Pulmonary Disease | Admitting: Pulmonary Disease

## 2024-02-29 DIAGNOSIS — J851 Abscess of lung with pneumonia: Secondary | ICD-10-CM | POA: Diagnosis not present

## 2024-02-29 DIAGNOSIS — J90 Pleural effusion, not elsewhere classified: Secondary | ICD-10-CM | POA: Diagnosis not present

## 2024-02-29 DIAGNOSIS — J189 Pneumonia, unspecified organism: Secondary | ICD-10-CM | POA: Diagnosis not present

## 2024-02-29 DIAGNOSIS — R0602 Shortness of breath: Secondary | ICD-10-CM | POA: Diagnosis not present

## 2024-02-29 DIAGNOSIS — R918 Other nonspecific abnormal finding of lung field: Secondary | ICD-10-CM | POA: Diagnosis not present

## 2024-02-29 DIAGNOSIS — J9811 Atelectasis: Secondary | ICD-10-CM | POA: Diagnosis not present

## 2024-03-28 DIAGNOSIS — R5381 Other malaise: Secondary | ICD-10-CM | POA: Diagnosis not present

## 2024-03-28 DIAGNOSIS — J851 Abscess of lung with pneumonia: Secondary | ICD-10-CM | POA: Diagnosis not present

## 2024-04-04 DIAGNOSIS — R600 Localized edema: Secondary | ICD-10-CM | POA: Diagnosis not present

## 2024-04-04 DIAGNOSIS — E781 Pure hyperglyceridemia: Secondary | ICD-10-CM | POA: Diagnosis not present

## 2024-04-04 DIAGNOSIS — I10 Essential (primary) hypertension: Secondary | ICD-10-CM | POA: Diagnosis not present

## 2024-04-04 DIAGNOSIS — R001 Bradycardia, unspecified: Secondary | ICD-10-CM | POA: Diagnosis not present

## 2024-04-04 DIAGNOSIS — Z23 Encounter for immunization: Secondary | ICD-10-CM | POA: Diagnosis not present

## 2024-04-04 DIAGNOSIS — Z8673 Personal history of transient ischemic attack (TIA), and cerebral infarction without residual deficits: Secondary | ICD-10-CM | POA: Diagnosis not present

## 2024-04-25 DIAGNOSIS — I10 Essential (primary) hypertension: Secondary | ICD-10-CM | POA: Diagnosis not present

## 2024-04-25 DIAGNOSIS — R790 Abnormal level of blood mineral: Secondary | ICD-10-CM | POA: Diagnosis not present

## 2024-04-25 DIAGNOSIS — H579 Unspecified disorder of eye and adnexa: Secondary | ICD-10-CM | POA: Diagnosis not present

## 2024-04-25 DIAGNOSIS — R7309 Other abnormal glucose: Secondary | ICD-10-CM | POA: Diagnosis not present

## 2024-04-25 DIAGNOSIS — Z125 Encounter for screening for malignant neoplasm of prostate: Secondary | ICD-10-CM | POA: Diagnosis not present

## 2024-04-25 DIAGNOSIS — E7800 Pure hypercholesterolemia, unspecified: Secondary | ICD-10-CM | POA: Diagnosis not present

## 2024-04-25 DIAGNOSIS — D649 Anemia, unspecified: Secondary | ICD-10-CM | POA: Diagnosis not present

## 2024-04-25 DIAGNOSIS — Z0000 Encounter for general adult medical examination without abnormal findings: Secondary | ICD-10-CM | POA: Diagnosis not present

## 2024-05-02 DIAGNOSIS — Z85828 Personal history of other malignant neoplasm of skin: Secondary | ICD-10-CM | POA: Diagnosis not present

## 2024-05-02 DIAGNOSIS — I10 Essential (primary) hypertension: Secondary | ICD-10-CM | POA: Diagnosis not present

## 2024-05-02 DIAGNOSIS — R7309 Other abnormal glucose: Secondary | ICD-10-CM | POA: Diagnosis not present

## 2024-05-02 DIAGNOSIS — Z8673 Personal history of transient ischemic attack (TIA), and cerebral infarction without residual deficits: Secondary | ICD-10-CM | POA: Diagnosis not present

## 2024-05-02 DIAGNOSIS — K219 Gastro-esophageal reflux disease without esophagitis: Secondary | ICD-10-CM | POA: Diagnosis not present

## 2024-05-02 DIAGNOSIS — R471 Dysarthria and anarthria: Secondary | ICD-10-CM | POA: Diagnosis not present

## 2024-05-02 DIAGNOSIS — N401 Enlarged prostate with lower urinary tract symptoms: Secondary | ICD-10-CM | POA: Diagnosis not present

## 2024-05-02 DIAGNOSIS — R351 Nocturia: Secondary | ICD-10-CM | POA: Diagnosis not present

## 2024-05-08 DIAGNOSIS — R195 Other fecal abnormalities: Secondary | ICD-10-CM | POA: Diagnosis not present

## 2024-05-08 DIAGNOSIS — Z862 Personal history of diseases of the blood and blood-forming organs and certain disorders involving the immune mechanism: Secondary | ICD-10-CM | POA: Diagnosis not present

## 2024-05-08 DIAGNOSIS — R1013 Epigastric pain: Secondary | ICD-10-CM | POA: Diagnosis not present

## 2024-05-14 ENCOUNTER — Ambulatory Visit: Admitting: Urology

## 2024-05-14 VITALS — BP 123/71 | HR 69 | Ht 70.0 in | Wt 169.0 lb

## 2024-05-14 DIAGNOSIS — R972 Elevated prostate specific antigen [PSA]: Secondary | ICD-10-CM | POA: Diagnosis not present

## 2024-05-14 DIAGNOSIS — R351 Nocturia: Secondary | ICD-10-CM | POA: Diagnosis not present

## 2024-05-14 NOTE — Progress Notes (Signed)
 05/14/2024 8:39 PM   Ronald Haney 1930/09/21 [id number]  Referring provider: Sadie Manna, MD [address] [facility] [place],  [place] [address]  Chief Complaint  Patient presents with   Urinary Frequency   Urologic history  1. BPH with LUTS Prior TURP, Dr. Patrina early 2000. Finasteride  5 mg daily Started finasteride  5 mg daily.   2. Gross hematuria CT urogram 12/2020 with BPH and no upper tract abnormalities Cystoscopy 12/2020 with prominent BPH/hypervascularity. Follow-up 08/02/2022 with recurrent gross hematuria; repeat cystoscopy 08/25/22 showed no bladder mucosal abnormalities.    HPI: Ronald Haney is a 89 y.o. male called for an appointment for evaluation of nocturia.  Several week history of nocturia x 4 Saw Dr. Hande 05/02/2024 and tamsulosin was added which he has been taking ~2 weeks.  Has not yet seen any significant change in his symptoms.  UA was unremarkable No other new medications or change in fluid intake No history of sleep apnea   PMH: Past Medical History:  Diagnosis Date   GERD (gastroesophageal reflux disease)    History of hiatal hernia    Hyperlipemia    Hypertension    Skin cancer    face and arms- squam   Stroke (HCC) 2019   vision- has returned to 99%    Surgical History: Past Surgical History:  Procedure Laterality Date   ANKLE SURGERY Right    ccy     CHOLECYSTECTOMY     COLONOSCOPY     LUMBAR LAMINECTOMY/DECOMPRESSION MICRODISCECTOMY Bilateral 10/12/2020   Procedure: Laminectomy and Foraminotomy - Lumbar four-Lumbar five - bilateral;  Surgeon: Colon Shove, MD;  Location: [facility] OR;  Service: Neurosurgery;  Laterality: Bilateral;   TRANSURETHRAL RESECTION OF PROSTATE      Home Medications:  Allergies as of 05/14/2024   No Known Allergies      Medication List        Accurate as of May 14, 2024 11:59 PM. If you have any questions, ask your nurse or doctor.          acetaminophen  500 MG  tablet Commonly known as: TYLENOL  Take 500-1,000 mg by mouth every 6 (six) hours as needed (pain).   amLODipine  10 MG tablet Commonly known as: NORVASC  Take 10 mg by mouth daily.   atorvastatin  40 MG tablet Commonly known as: Lipitor Take 1 tablet (40 mg total) by mouth daily. What changed: when to take this   clopidogrel  75 MG tablet Commonly known as: PLAVIX  Take 1 tablet (75 mg total) by mouth daily. What changed: when to take this   cyanocobalamin  1000 MCG tablet Commonly known as: VITAMIN B12 Take 1,000 mcg by mouth.  Take 1 tablet (1,000 mcg total) by mouth once daily   docusate sodium  100 MG capsule Commonly known as: COLACE Take 1 capsule (100 mg total) by mouth 2 (two) times daily.   finasteride  5 MG tablet Commonly known as: PROSCAR  Take 1 tablet (5 mg total) by mouth daily.   fluocinolone 0.01 % external solution Commonly known as: SYNALAR Apply topically.  APPLY SMALL AMOUNT TOPICALLY TWO TIMES A DAY USE FOR ITCHING IN AND ON EARS   furosemide  20 MG tablet Commonly known as: LASIX  Take 20 mg by mouth daily.   gemfibrozil  600 MG tablet Commonly known as: LOPID  Take 600 mg by mouth 2 (two) times daily.   Gerhardt's butt cream Crea Apply 1 Application topically daily.   guaiFENesin  600 MG 12 hr tablet Commonly known as: MUCINEX  Take 2 tablets (  1,200 mg total) by mouth 2 (two) times daily.   lisinopril  40 MG tablet Commonly known as: ZESTRIL  Take 40 mg by mouth in the morning.   methocarbamol  500 MG tablet Commonly known as: ROBAXIN  Take 500 mg by mouth.   olopatadine  0.1 % ophthalmic solution Commonly known as: PATANOL Apply 1 drop to eye. Place 1 drop into both eyes 2 (two) times daily   omeprazole 20 MG capsule Commonly known as: PRILOSEC Take 20 mg by mouth daily before breakfast.   salicyclic acid-sulfur 2-2 % shampoo Commonly known as: SEBULEX SHAMPOO CAPFUL(S) TOPICALLY MONDAY,WEDNESDAY,FRIDAY   Salicylic Acid 40 % Misc SOAK AS  DIRECTED THE AFFECTED AREA IN WARM WATER FOR 5 MINUTES, DRY, THEN APPLY PLASTER. REMOVE AFTER 48 HOURS, REPEAT. USE FOR 14 DAYS ONLY   tamsulosin 0.4 MG Caps capsule Commonly known as: FLOMAX Take 0.4 mg by mouth.        Allergies: No Known Allergies  Family History: Family History  Problem Relation Age of Onset   CAD Father     Social History:  reports that he has never smoked. He has never used smokeless tobacco. He reports that he does not currently use alcohol . He reports that he does not currently use drugs.   Physical Exam: BP 123/71   Pulse 69   Ht 5' 10 (1.778 m)   Wt 169 lb (76.7 kg)   BMI 24.25 kg/m   Constitutional:  Alert, No acute distress. HEENT: [place] AT Respiratory: Normal respiratory effort, no increased work of breathing. Psychiatric: Normal mood and affect.    Assessment & Plan:    1.  Nocturia PVR today 6 mL Recommend continuing tamsulosin for at least another 2 weeks.  He will call back if no improvement in his nocturia If no improvement with tamsulosin trial trospium 20 mg at bedtime x 1 month.  If this is not effective would recommend sleep study to evaluate for sleep apnea   Ronald JAYSON Barba, MD  [facility] [address] [place], [place] [address] [phone]

## 2024-05-15 LAB — PSA: Prostate Specific Ag, Serum: 0.9 ng/mL (ref 0.0–4.0)

## 2024-05-19 ENCOUNTER — Encounter: Payer: Self-pay | Admitting: Urology

## 2024-09-27 ENCOUNTER — Ambulatory Visit: Admitting: Urology
